# Patient Record
Sex: Female | Born: 1940 | Race: White | Hispanic: No | Smoking: Former smoker
Health system: Southern US, Community
[De-identification: ages and names within clinical notes are randomized; demographics above are authoritative.]

## PROBLEM LIST (undated history)

## (undated) DIAGNOSIS — I493 Ventricular premature depolarization: Secondary | ICD-10-CM

## (undated) DIAGNOSIS — K219 Gastro-esophageal reflux disease without esophagitis: Secondary | ICD-10-CM

## (undated) DIAGNOSIS — Z79899 Other long term (current) drug therapy: Secondary | ICD-10-CM

## (undated) DIAGNOSIS — Z7901 Long term (current) use of anticoagulants: Secondary | ICD-10-CM

## (undated) DIAGNOSIS — E039 Hypothyroidism, unspecified: Secondary | ICD-10-CM

## (undated) DIAGNOSIS — D2121 Benign neoplasm of connective and other soft tissue of right lower limb, including hip: Secondary | ICD-10-CM

## (undated) DIAGNOSIS — I4891 Unspecified atrial fibrillation: Secondary | ICD-10-CM

## (undated) DIAGNOSIS — N189 Chronic kidney disease, unspecified: Secondary | ICD-10-CM

## (undated) DIAGNOSIS — M47812 Spondylosis without myelopathy or radiculopathy, cervical region: Secondary | ICD-10-CM

## (undated) DIAGNOSIS — R011 Cardiac murmur, unspecified: Secondary | ICD-10-CM

## (undated) DIAGNOSIS — F419 Anxiety disorder, unspecified: Secondary | ICD-10-CM

## (undated) DIAGNOSIS — D472 Monoclonal gammopathy: Secondary | ICD-10-CM

## (undated) DIAGNOSIS — E042 Nontoxic multinodular goiter: Secondary | ICD-10-CM

## (undated) DIAGNOSIS — J349 Unspecified disorder of nose and nasal sinuses: Secondary | ICD-10-CM

## (undated) DIAGNOSIS — K529 Noninfective gastroenteritis and colitis, unspecified: Secondary | ICD-10-CM

## (undated) DIAGNOSIS — I471 Supraventricular tachycardia, unspecified: Secondary | ICD-10-CM

## (undated) DIAGNOSIS — J45909 Unspecified asthma, uncomplicated: Secondary | ICD-10-CM

## (undated) DIAGNOSIS — D6851 Activated protein C resistance: Secondary | ICD-10-CM

## (undated) DIAGNOSIS — I4719 Other supraventricular tachycardia: Secondary | ICD-10-CM

## (undated) DIAGNOSIS — J069 Acute upper respiratory infection, unspecified: Secondary | ICD-10-CM

## (undated) DIAGNOSIS — I890 Lymphedema, not elsewhere classified: Secondary | ICD-10-CM

## (undated) DIAGNOSIS — I10 Essential (primary) hypertension: Secondary | ICD-10-CM

## (undated) DIAGNOSIS — N183 Chronic kidney disease, stage 3 unspecified: Secondary | ICD-10-CM

## (undated) DIAGNOSIS — R001 Bradycardia, unspecified: Secondary | ICD-10-CM

## (undated) DIAGNOSIS — Z87442 Personal history of urinary calculi: Secondary | ICD-10-CM

## (undated) DIAGNOSIS — M1990 Unspecified osteoarthritis, unspecified site: Secondary | ICD-10-CM

## (undated) DIAGNOSIS — B029 Zoster without complications: Secondary | ICD-10-CM

## (undated) DIAGNOSIS — I700 Atherosclerosis of aorta: Secondary | ICD-10-CM

## (undated) DIAGNOSIS — K649 Unspecified hemorrhoids: Secondary | ICD-10-CM

## (undated) DIAGNOSIS — I2510 Atherosclerotic heart disease of native coronary artery without angina pectoris: Secondary | ICD-10-CM

## (undated) DIAGNOSIS — K5790 Diverticulosis of intestine, part unspecified, without perforation or abscess without bleeding: Secondary | ICD-10-CM

## (undated) DIAGNOSIS — K635 Polyp of colon: Secondary | ICD-10-CM

## (undated) DIAGNOSIS — M810 Age-related osteoporosis without current pathological fracture: Secondary | ICD-10-CM

## (undated) DIAGNOSIS — R7303 Prediabetes: Secondary | ICD-10-CM

## (undated) DIAGNOSIS — E785 Hyperlipidemia, unspecified: Secondary | ICD-10-CM

## (undated) HISTORY — PX: BREAST CYST ASPIRATION: SHX578

## (undated) HISTORY — PX: BLEPHAROPLASTY: SUR158

## (undated) HISTORY — PX: APPENDECTOMY: SHX54

## (undated) HISTORY — PX: CARDIAC CATHETERIZATION: SHX172

## (undated) HISTORY — DX: Age-related osteoporosis without current pathological fracture: M81.0

## (undated) HISTORY — DX: Hyperlipidemia, unspecified: E78.5

## (undated) HISTORY — DX: Anxiety disorder, unspecified: F41.9

## (undated) HISTORY — DX: Gastro-esophageal reflux disease without esophagitis: K21.9

## (undated) HISTORY — DX: Polyp of colon: K63.5

## (undated) HISTORY — DX: Noninfective gastroenteritis and colitis, unspecified: K52.9

## (undated) HISTORY — PX: NECK SURGERY: SHX720

## (undated) HISTORY — DX: Monoclonal gammopathy: D47.2

## (undated) HISTORY — DX: Essential (primary) hypertension: I10

## (undated) HISTORY — DX: Unspecified osteoarthritis, unspecified site: M19.90

## (undated) HISTORY — PX: ABDOMINAL HYSTERECTOMY: SHX81

## (undated) HISTORY — DX: Zoster without complications: B02.9

## (undated) HISTORY — PX: KNEE SURGERY: SHX244

## (undated) HISTORY — PX: COLONOSCOPY: SHX174

---

## 2008-02-12 DIAGNOSIS — K5730 Diverticulosis of large intestine without perforation or abscess without bleeding: Secondary | ICD-10-CM | POA: Insufficient documentation

## 2010-07-15 DIAGNOSIS — Z9889 Other specified postprocedural states: Secondary | ICD-10-CM

## 2010-07-15 HISTORY — PX: LEFT HEART CATH AND CORONARY ANGIOGRAPHY: CATH118249

## 2010-07-15 HISTORY — DX: Other specified postprocedural states: Z98.890

## 2010-07-30 DIAGNOSIS — I2510 Atherosclerotic heart disease of native coronary artery without angina pectoris: Secondary | ICD-10-CM | POA: Insufficient documentation

## 2013-04-26 DIAGNOSIS — E042 Nontoxic multinodular goiter: Secondary | ICD-10-CM | POA: Insufficient documentation

## 2014-11-28 DIAGNOSIS — D6851 Activated protein C resistance: Secondary | ICD-10-CM | POA: Insufficient documentation

## 2015-09-17 DIAGNOSIS — D179 Benign lipomatous neoplasm, unspecified: Secondary | ICD-10-CM

## 2015-09-17 HISTORY — DX: Benign lipomatous neoplasm, unspecified: D17.9

## 2015-10-31 DIAGNOSIS — N952 Postmenopausal atrophic vaginitis: Secondary | ICD-10-CM | POA: Insufficient documentation

## 2017-02-10 DIAGNOSIS — R600 Localized edema: Secondary | ICD-10-CM | POA: Insufficient documentation

## 2017-09-21 DIAGNOSIS — R1310 Dysphagia, unspecified: Secondary | ICD-10-CM | POA: Insufficient documentation

## 2017-09-21 DIAGNOSIS — M19011 Primary osteoarthritis, right shoulder: Secondary | ICD-10-CM | POA: Insufficient documentation

## 2017-11-03 DIAGNOSIS — Z8711 Personal history of peptic ulcer disease: Secondary | ICD-10-CM | POA: Insufficient documentation

## 2017-11-14 DIAGNOSIS — M8580 Other specified disorders of bone density and structure, unspecified site: Secondary | ICD-10-CM | POA: Insufficient documentation

## 2018-03-23 DIAGNOSIS — I471 Supraventricular tachycardia: Secondary | ICD-10-CM | POA: Insufficient documentation

## 2018-04-05 HISTORY — PX: LEFT HEART CATH AND CORONARY ANGIOGRAPHY: CATH118249

## 2018-11-06 DIAGNOSIS — M810 Age-related osteoporosis without current pathological fracture: Secondary | ICD-10-CM | POA: Insufficient documentation

## 2019-01-21 ENCOUNTER — Emergency Department: Payer: Medicare Other

## 2019-01-21 ENCOUNTER — Emergency Department
Admission: EM | Admit: 2019-01-21 | Discharge: 2019-01-21 | Disposition: A | Discharge status: Home / Self Care | Payer: Medicare Other | Attending: Emergency Medicine | Admitting: Emergency Medicine

## 2019-01-21 ENCOUNTER — Other Ambulatory Visit: Payer: Self-pay

## 2019-01-21 ENCOUNTER — Encounter: Payer: Self-pay | Admitting: Emergency Medicine

## 2019-01-21 DIAGNOSIS — M2291 Unspecified disorder of patella, right knee: Secondary | ICD-10-CM | POA: Diagnosis not present

## 2019-01-21 DIAGNOSIS — Z87891 Personal history of nicotine dependence: Secondary | ICD-10-CM | POA: Diagnosis not present

## 2019-01-21 DIAGNOSIS — Z79899 Other long term (current) drug therapy: Secondary | ICD-10-CM | POA: Diagnosis not present

## 2019-01-21 DIAGNOSIS — M79604 Pain in right leg: Secondary | ICD-10-CM | POA: Diagnosis present

## 2019-01-21 DIAGNOSIS — R2241 Localized swelling, mass and lump, right lower limb: Secondary | ICD-10-CM | POA: Diagnosis not present

## 2019-01-21 DIAGNOSIS — M5431 Sciatica, right side: Secondary | ICD-10-CM

## 2019-01-21 HISTORY — DX: Supraventricular tachycardia: I47.1

## 2019-01-21 HISTORY — DX: Activated protein C resistance: D68.51

## 2019-01-21 HISTORY — DX: Other supraventricular tachycardia: I47.19

## 2019-01-21 IMAGING — DX DG KNEE COMPLETE 4+V*R*
4 series · 4 of 4 positions shown · non-contrast
Comparison: None.

CLINICAL DATA: Right knee pain for approximately 1.5 weeks. No
known injury.

EXAM:
RIGHT KNEE - COMPLETE 4+ VIEW

[knee ap]
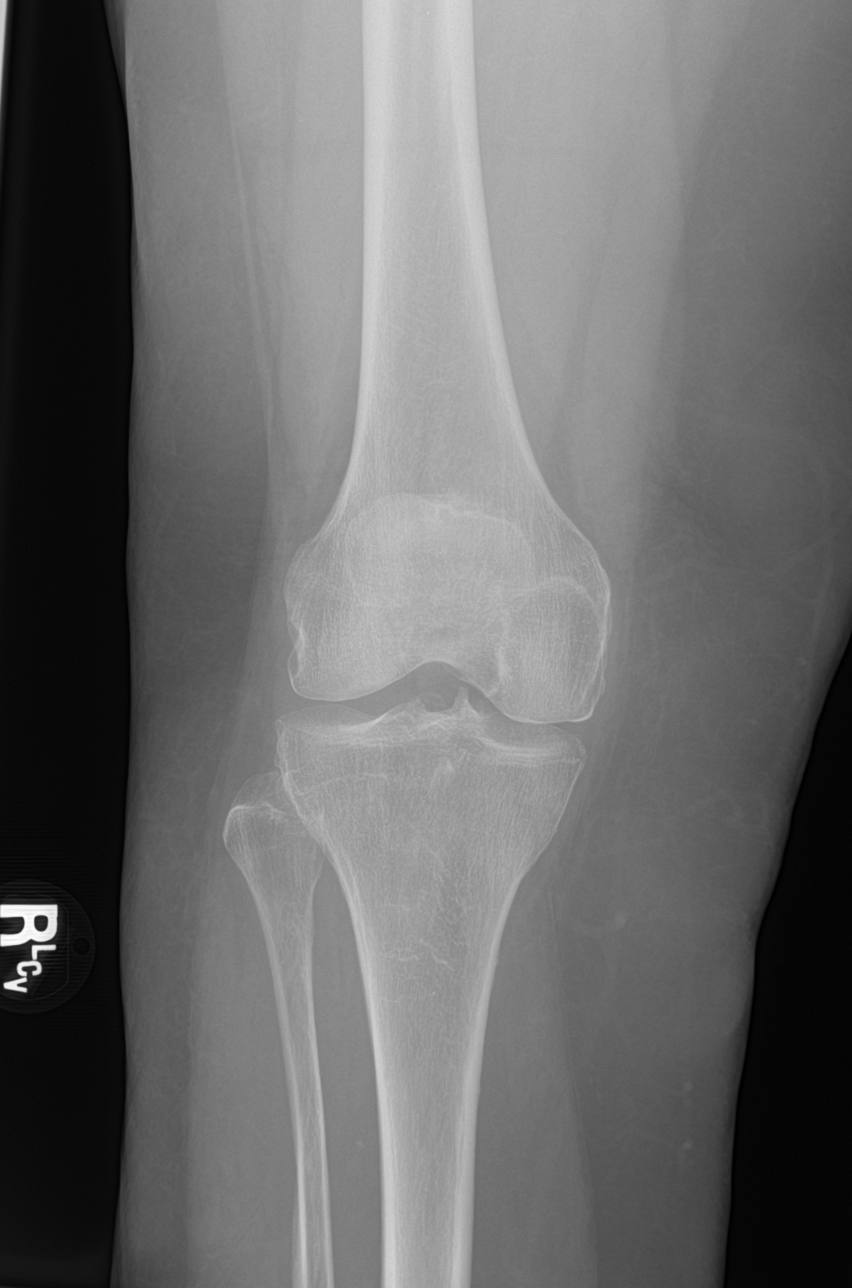

[knee lat]
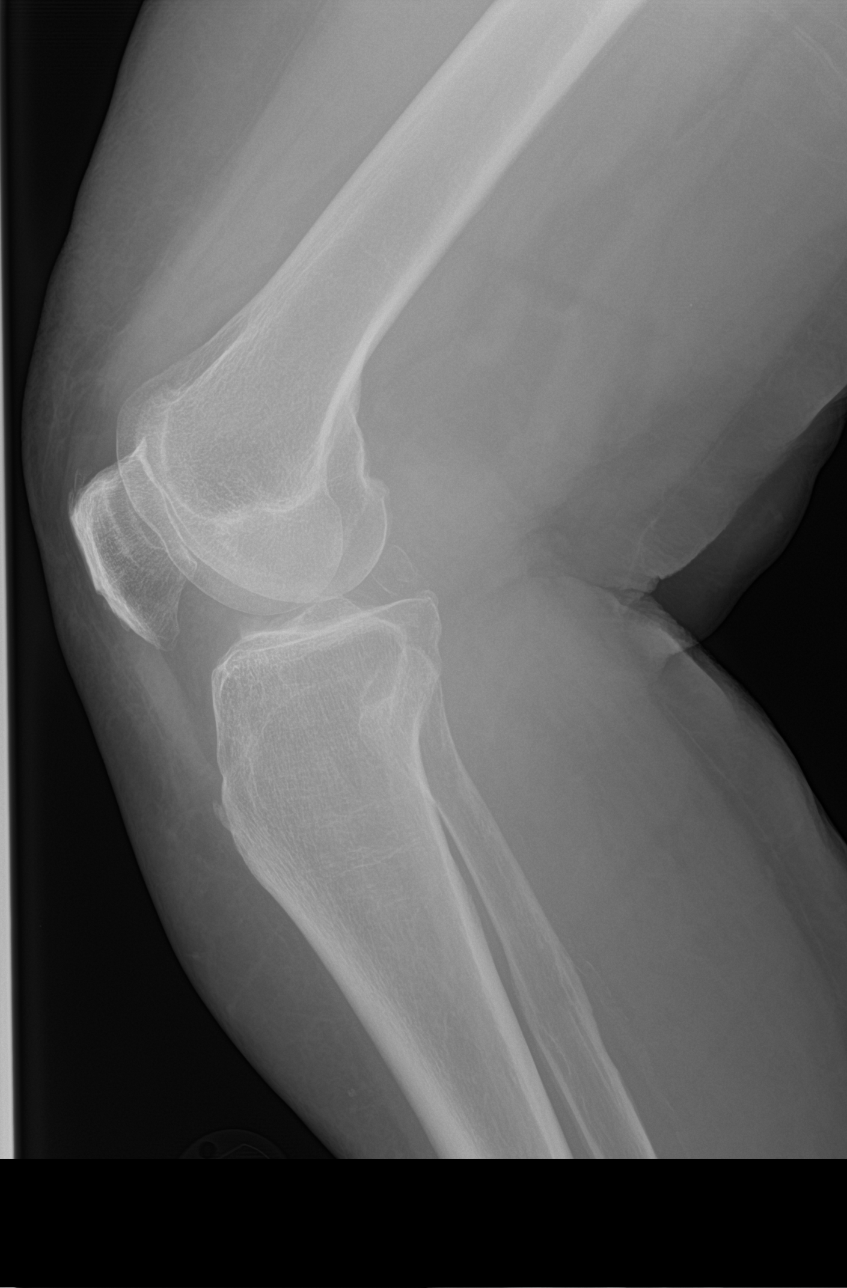

[knee obl (1 of 2)]
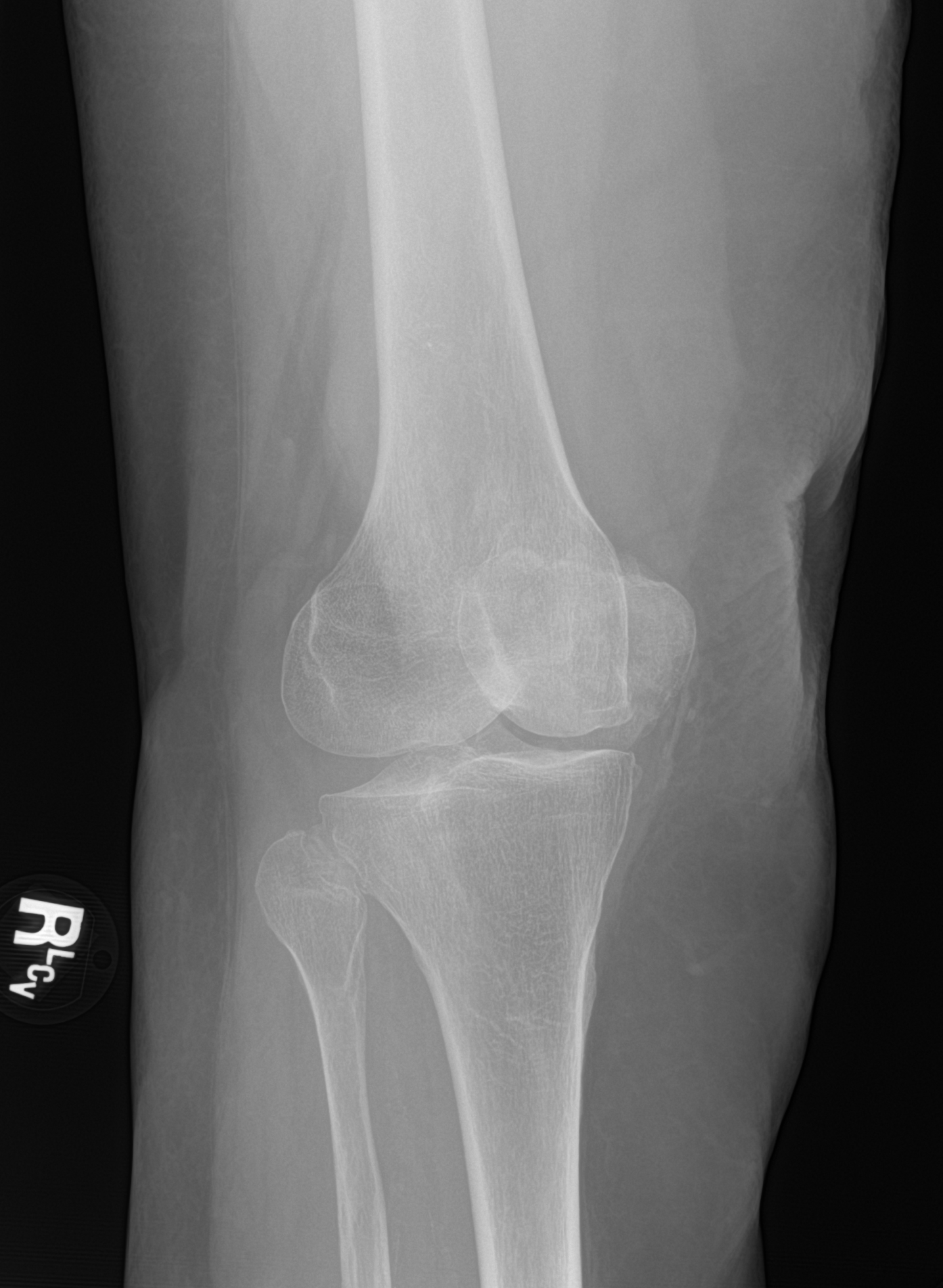

[knee obl (2 of 2)]
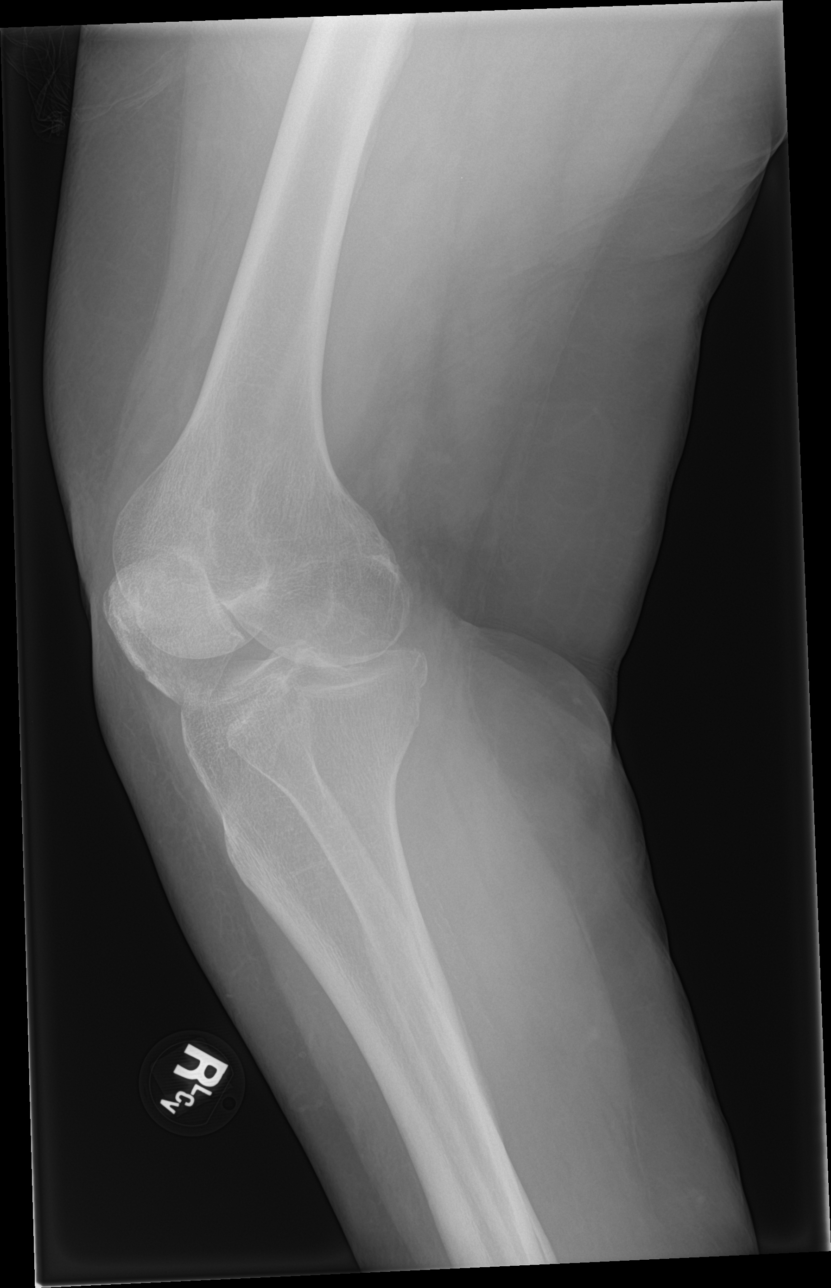

[4 of 4 positions shown; findings below may reference images not displayed]

FINDINGS: There is no acute bony or joint abnormality. Small osteophyte off
the superior pole of the patella is noted. Patella baja is also
seen. Small joint effusion.
IMPRESSION: No acute bony abnormality.

Small joint effusion.

Mild patellofemoral osteoarthritis and patella baja.

## 2019-01-21 IMAGING — US US EXTREM LOW VENOUS*R*
1 series · 13 of 24 positions shown · non-contrast
Comparison: None.

CLINICAL DATA: Right lower extremity pain and edema for the past
week. Evaluate for DVT.



[Series 1: us extrem low venous*right* · 13 of 34 slices shown]
[im 1/34]
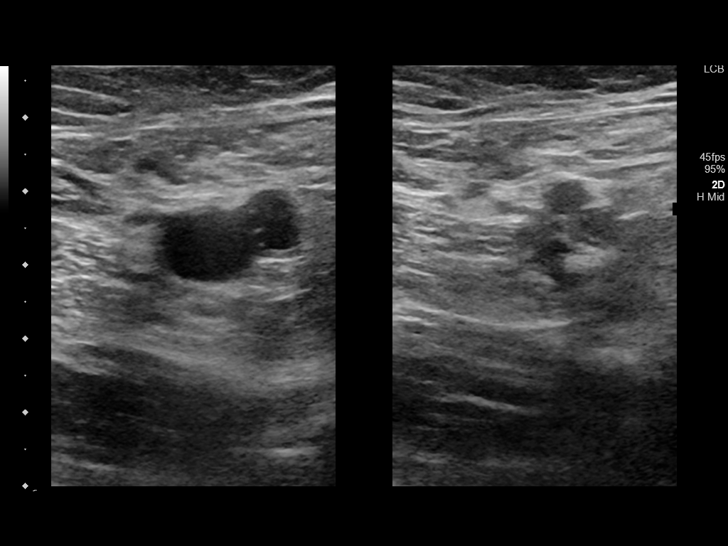
[im 3/34]
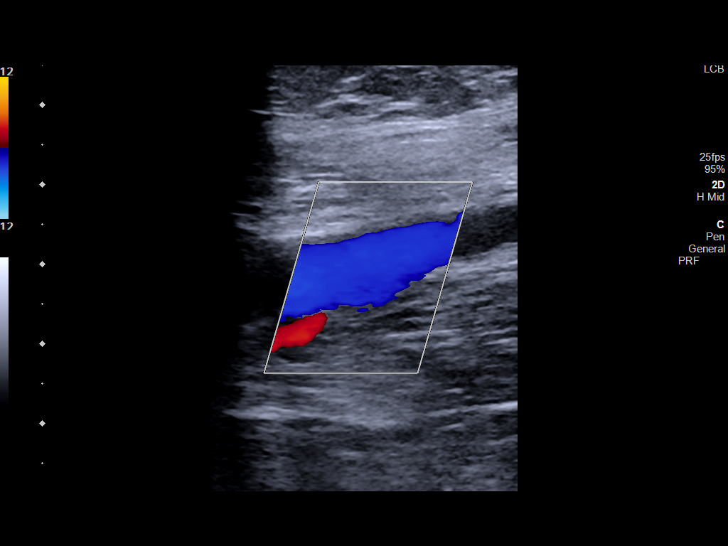
[im 6/34]
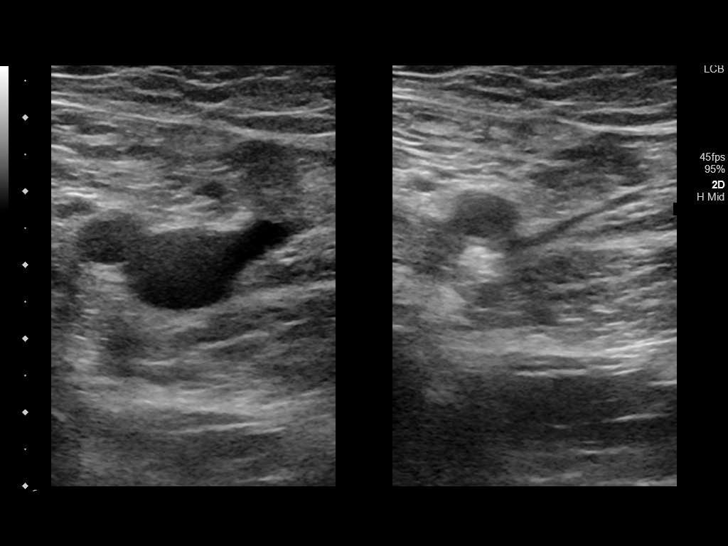
[im 9/34]
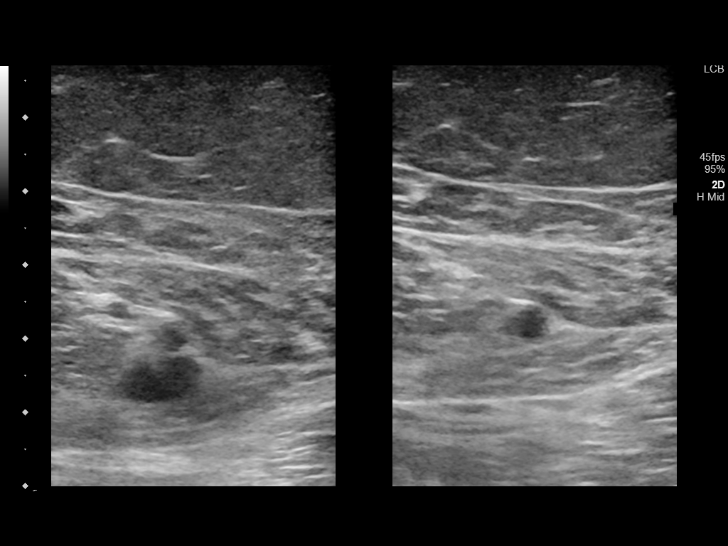
[im 12/34]
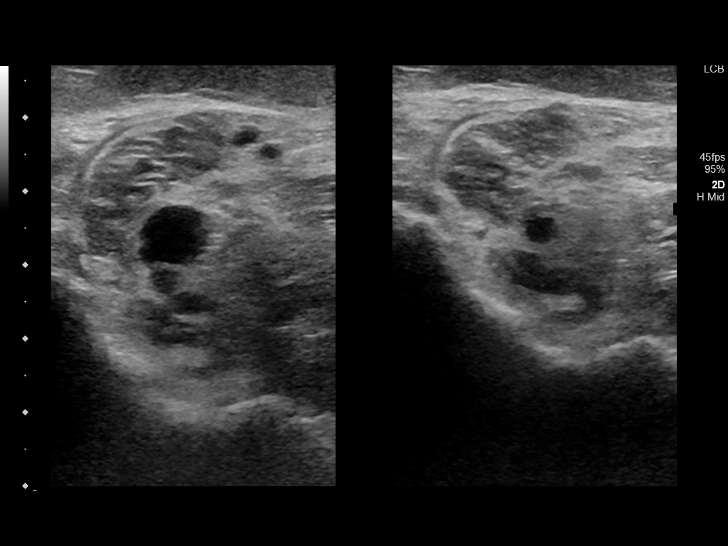
[im 15/34]
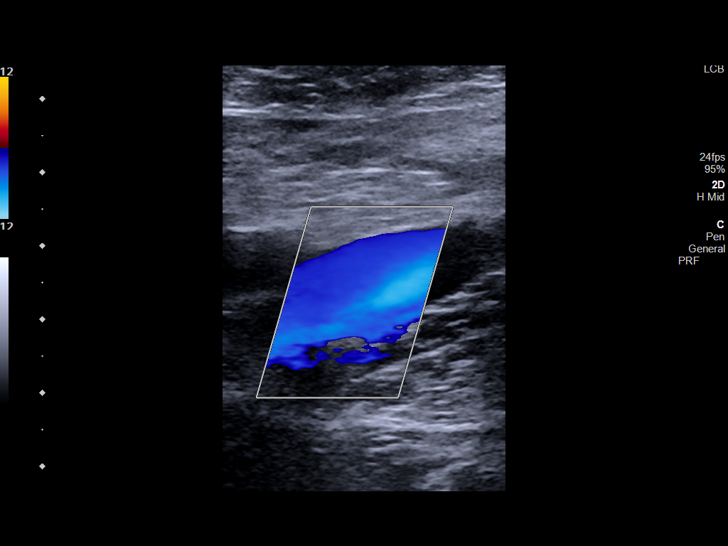
[im 18/34]
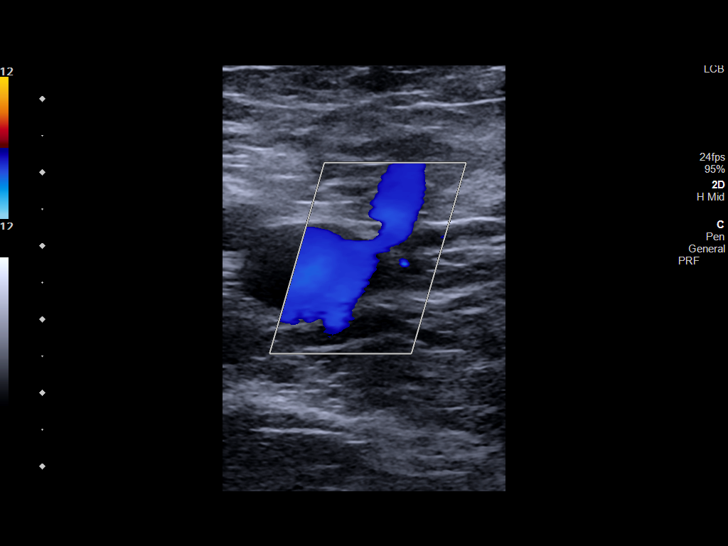
[im 19/34]
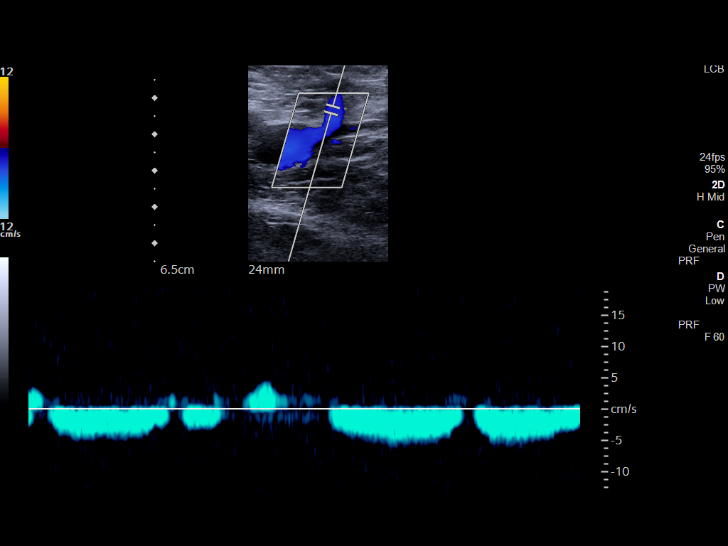
[im 22/34]
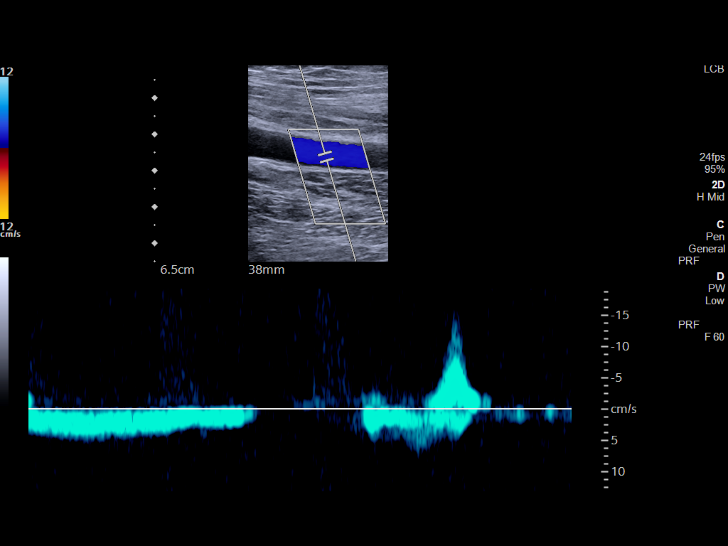
[im 25/34]
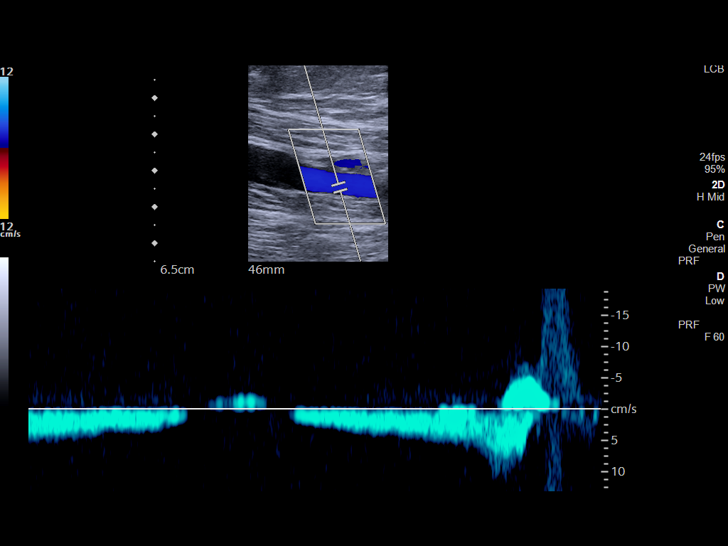
[im 28/34]
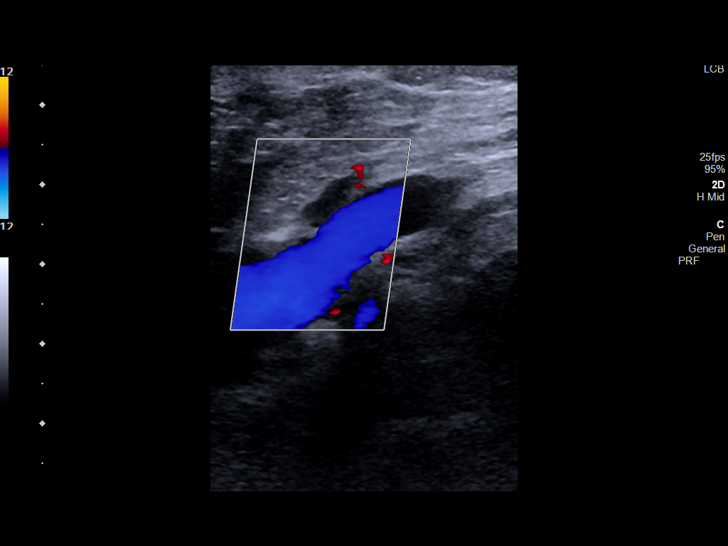
[im 31/34]
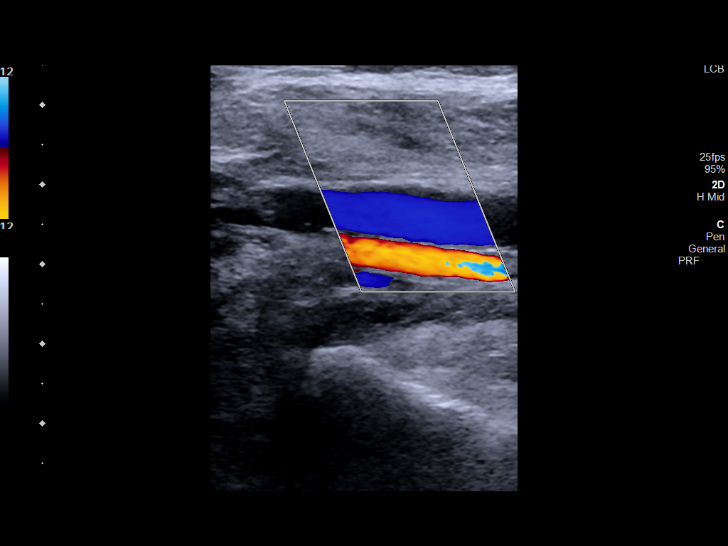
[im 34/34]
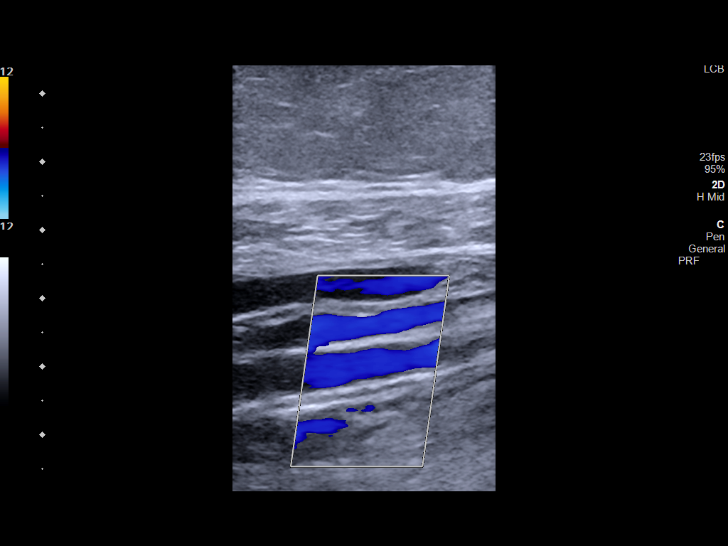

[13 of 24 positions shown; findings below may reference images not displayed]

FINDINGS: Contralateral Common Femoral Vein: Respiratory phasicity is normal
and symmetric with the symptomatic side. No evidence of thrombus.
Normal compressibility.

Common Femoral Vein: No evidence of thrombus. Normal
compressibility, respiratory phasicity and response to augmentation.

Saphenofemoral Junction: No evidence of thrombus. Normal
compressibility and flow on color Doppler imaging.

Profunda Femoral Vein: No evidence of thrombus. Normal
compressibility and flow on color Doppler imaging.

Femoral Vein: No evidence of thrombus. Normal compressibility,
respiratory phasicity and response to augmentation.

Popliteal Vein: No evidence of thrombus. Normal compressibility,
respiratory phasicity and response to augmentation.

Calf Veins: No evidence of thrombus. Normal compressibility and flow
on color Doppler imaging.

Superficial Great Saphenous Vein: No evidence of thrombus. Normal
compressibility.

Venous Reflux:  None.

Other Findings:  None.
IMPRESSION: No evidence DVT within the right lower extremity.

## 2019-01-21 MED ORDER — PREDNISONE 10 MG (21) PO TBPK: ORAL_TABLET | ORAL | 0 refills | Status: DC

## 2019-01-21 MED ORDER — TRAMADOL HCL 50 MG PO TABS: 50.0000 mg | ORAL_TABLET | Freq: Four times a day (QID) | ORAL | 0 refills | Status: DC | PRN

## 2019-01-21 NOTE — ED Provider Notes (Signed)
[facility] Emergency Department Provider Note ____________________________________________  Time seen: Approximately 10:52 AM  I have reviewed the triage vital signs and the nursing notes.   HISTORY  Chief Complaint Hip Pain and Leg Pain    HPI Magali Amelio is a 78 y.o. female who presents to the emergency department for evaluation and treatment of right leg pain and swelling. She is concerned that she may have a blood clot. She denies specific injury, but states that she has started a new workout and has mowed the grass. No relief with ibuprofen.   Past Medical History:  Diagnosis Date  . Factor V Leiden ([place])   . PAT (paroxysmal atrial tachycardia) ([facility])     There are no active problems to display for this patient.   History reviewed. No pertinent surgical history.  Prior to Admission medications   Medication Sig Start Date End Date Taking? Authorizing Provider  atenolol (TENORMIN) 25 MG tablet Take by mouth daily.   Yes [provider]  atorvastatin (LIPITOR) 20 MG tablet Take 20 mg by mouth daily.   Yes [provider]  predniSONE (STERAPRED UNI-PAK 21 TAB) 10 MG (21) TBPK tablet Take 6 tablets on the first day and decrease by 1 tablet each day until finished. 01/21/19   Ival Pacer B, FNP  traMADol (ULTRAM) 50 MG tablet Take 1 tablet (50 mg total) by mouth every 6 (six) hours as needed. 01/21/19   Jamea Robicheaux, Johnette Abraham B, FNP    Allergies Metoprolol succinate  [metoprolol tartrate] and Other  No family history on file.  Social History Social History   Tobacco Use  . Smoking status: Former Research scientist (life sciences)  . Smokeless tobacco: Never Used  Substance Use Topics  . Alcohol use: Not on file  . Drug use: Not on file    Review of Systems Constitutional: Negative for fever. Cardiovascular: Negative for chest pain. Respiratory: Negative for shortness of breath. Musculoskeletal: Positive for right leg pain Skin: Negative for open  wounds.  Neurological: Negative for decrease in sensation  ____________________________________________   PHYSICAL EXAM:  VITAL SIGNS: ED Triage Vitals [01/21/19 1041]  Enc Vitals Group     BP (!) 170/92     Pulse Rate (!) 57     Resp 18     Temp 98.3 F (36.8 C)     Temp Source Oral     SpO2 94 %     Weight 160 lb (72.6 kg)     Height 5\' 2"  (1.575 m)     Head Circumference      Peak Flow      Pain Score 8     Pain Loc      Pain Edu?      Excl. in [place]?     Constitutional: Alert and oriented. Well appearing and in no acute distress. Eyes: Conjunctivae are clear without discharge or drainage Head: Atraumatic Neck: Supple Respiratory: No cough. Respirations are even and unlabored. Musculoskeletal: FROM of the right leg, knee, foot, and ankle. Tenderness behind right knee. No obvious swelling or joint effusion. Neurologic: Motor and sensory function is intact.  Skin: No wounds or lesions over right leg.  Psychiatric: Affect and behavior are appropriate.  ____________________________________________   LABS (all labs ordered are listed, but only abnormal results are displayed)  Labs Reviewed - No data to display ____________________________________________  RADIOLOGY  Ultrasound of the right lower extremity is negative for DVT.  X-ray image of the right knee shows a low-lying patella and osteophyte/arthritis. ____________________________________________  PROCEDURES  Procedures  ____________________________________________   INITIAL IMPRESSION / ASSESSMENT AND PLAN / ED COURSE  Mariahlynn Cubbage is a 78 y.o. who presents to the emergency department for treatment and evaluation of right lower extremity pain.  She will be treated with prednisone and tramadol and is to follow-up with orthopedics.  Medications - No data to display  Pertinent labs & imaging results that were available during my care of the patient were reviewed by me and considered in my medical  decision making (see chart for details).  _________________________________________   FINAL CLINICAL IMPRESSION(S) / ED DIAGNOSES  Final diagnoses:  Patellar disorder, right  Sciatica of right side    ED Discharge Orders         Ordered    predniSONE (STERAPRED UNI-PAK 21 TAB) 10 MG (21) TBPK tablet     01/21/19 1308    traMADol (ULTRAM) 50 MG tablet  Every 6 hours PRN     01/21/19 1308           If controlled substance prescribed during this visit, 12 month history viewed on the [place] prior to issuing an initial prescription for Schedule II or III opiod.   Victorino Dike, FNP 01/21/19 1348    Delman Kitten, MD 01/21/19 1528

## 2019-01-21 NOTE — ED Notes (Signed)
See triage note  Presents wit right leg pain  States pain started at hip and moved down her leg  States she noticed some swelling to leg also   States she has been using IBU 600 mg with some relief ambulates with sl limp d/t pain

## 2019-01-21 NOTE — Discharge Instructions (Signed)
The x-ray shows that your kneecap sits lower than expected. This is likely the reason for your pain.  Do not take the ibuprofen while you are taking the prednisone. The prednisone should help with the pain that is in your buttock and leg.  Schedule an appointment with orthopedics.

## 2019-01-21 NOTE — ED Triage Notes (Signed)
Pt c/o right hip and leg pain. Pt states she has had the pain x 1.5 weeks. Pt states she started exercising recently as well as mowing x 1.5 hours.  Pt reports the pain is a throbbing pain. Pt reports feeling a pulling sensation in her knee.

## 2019-01-23 DIAGNOSIS — D472 Monoclonal gammopathy: Secondary | ICD-10-CM | POA: Insufficient documentation

## 2019-03-28 ENCOUNTER — Ambulatory Visit (INDEPENDENT_AMBULATORY_CARE_PROVIDER_SITE_OTHER): Payer: Medicare Other | Admitting: Urology

## 2019-03-28 ENCOUNTER — Other Ambulatory Visit: Payer: Self-pay

## 2019-03-28 ENCOUNTER — Encounter: Payer: Self-pay | Admitting: Urology

## 2019-03-28 VITALS — BP 158/77 | HR 54 | Ht 62.0 in | Wt 165.0 lb

## 2019-03-28 DIAGNOSIS — N3941 Urge incontinence: Secondary | ICD-10-CM

## 2019-03-28 DIAGNOSIS — R3121 Asymptomatic microscopic hematuria: Secondary | ICD-10-CM | POA: Diagnosis not present

## 2019-03-28 MED ORDER — MIRABEGRON ER 50 MG PO TB24: 50.0000 mg | ORAL_TABLET | Freq: Every day | ORAL | 11 refills | Status: DC

## 2019-03-28 NOTE — Progress Notes (Signed)
03/28/19 3:29 PM   Karie Soda [birth year] [id number]  Referring provider: [place] [address] [place],  [place] [address]  CC: Urgency and urge incontinence  HPI: I saw Ms. Mieles in urology clinic in consultation for urgency and urge incontinence.  She is a very healthy 78 year old female who reports over a year of bothersome urgency and urge incontinence.  She reports when she gets up and starts to go to the bathroom she will leak before she makes it in time.  She denies any stress incontinence.  She has not had trouble with recurrent UTIs.  She denies any dysuria, gross hematuria, or flank pain.  She tried oxybutynin from her PCP which improved her urgency and incontinence, however she had bothersome dry mouth and GI upset with this medication and self stopped it.  She drinks 2 cups of coffee in the morning and then water the rest of the day.  She has had a hysterectomy previously.  She has a 10-pack-year smoking history, and quit 30 years ago.  Urinalysis today shows 0-5 WBCs, 3-10 RBCs, no bacteria, nitrite negative.  PMH: Past Medical History:  Diagnosis Date  . Anxiety   . Arthritis   . Colon polyp   . Factor V Leiden ([place])   . GERD (gastroesophageal reflux disease)   . Herpes zoster   . Hyperlipidemia   . Hypertension   . Inflammatory bowel disease   . Osteoporosis   . PAT (paroxysmal atrial tachycardia) ([place])     Surgical History: Past Surgical History:  Procedure Laterality Date  . APPENDECTOMY    . COLONOSCOPY      Allergies:  Allergies  Allergen Reactions  . Metoprolol Succinate  [Metoprolol Tartrate] Other (See Comments)    headache  . Other Rash    "Dexatone"-kidney medication  . Dexamethasone Rash    Family History: No family history on file.  Social History:  reports that she has quit smoking. She has never used smokeless tobacco. She reports that she does not use drugs. No history on file for alcohol.  ROS: Please see  flowsheet from today's date for complete review of systems.  Physical Exam: BP (!) 158/77 (BP Location: Left Arm, Patient Position: Sitting, Cuff Size: Normal)   Pulse (!) 54   Ht 5\' 2"  (1.575 m)   Wt 165 lb (74.8 kg)   BMI 30.18 kg/m    Constitutional:  Alert and oriented, No acute distress. Cardiovascular: No clubbing, cyanosis, or edema. Respiratory: Normal respiratory effort, no increased work of breathing. GI: Abdomen is soft, nontender, nondistended, no abdominal masses GU: No CVA tenderness Lymph: No cervical or inguinal lymphadenopathy. Skin: No rashes, bruises or suspicious lesions. Neurologic: Grossly intact, no focal deficits, moving all 4 extremities. Psychiatric: Normal mood and affect.  Laboratory Data: Urinalysis today 0-5 WBCs, 3-10 RBCs, no bacteria, nitrite negative  Assessment & Plan:   In summary, she is a healthy 78 year old female with over a year of urinary urgency and urge incontinence consistent with overactive bladder.  She also has mild asymptomatic microscopic hematuria on urinalysis today.  We discussed that overactive bladder (OAB) is not a disease, but is a symptom complex that is generally not life-threatening.  Symptoms typically include urinary urgency, frequency, and urge incontinence.  There are numerous treatment options, however there are risks and benefits with both medical and surgical management.  First-line treatment is behavioral therapies including bladder training, pelvic floor muscle training, and fluid management.  Second line treatments include oral antimuscarinics(Ditropan  er, Trospium) and beta-3 agonist (Mybetriq). There is typically a period of medication trial (4-8 weeks) to find the optimal therapy and dosing. If symptoms are bothersome despite the above management, third line options include intra-detrusor botox, peripheral tibial nerve stimulation (PTNS), and interstim (SNS). These are more invasive treatments with higher side effect  profile, but may improve quality of life for patients with severe OAB symptoms.   -Regarding her low-grade microscopic hematuria, we discussed the new AUA guidelines regarding the risk stratification for evaluation.  She would like to defer cystoscopy and CT at this time, but she is amenable to a renal ultrasound.  I think this very reasonable.  We discussed the need for more aggressive evaluation if she developed gross hematuria or increasing severity of her microscopic hematuria -Trial of Myrbetriq for OAB symptoms, she would like to follow-up in 1 year for symptom check, and call in the next few weeks if she does not have an improvement in her symptoms.  A total of 45 minutes were spent face-to-face with the patient, greater than 50% was spent in patient education, counseling, and coordination of care regarding overactive bladder and microscopic hematuria.  Billey Co, [facility] [address], [place], [place] [address] [phone]

## 2019-03-28 NOTE — Patient Instructions (Signed)

## 2019-03-29 LAB — MICROSCOPIC EXAMINATION: Bacteria, UA: NONE SEEN

## 2019-03-29 LAB — URINALYSIS, COMPLETE
Bilirubin, UA: NEGATIVE
Glucose, UA: NEGATIVE
Ketones, UA: NEGATIVE
Leukocytes,UA: NEGATIVE
Nitrite, UA: NEGATIVE
Protein,UA: NEGATIVE
Specific Gravity, UA: 1.025 (ref 1.005–1.030)
Urobilinogen, Ur: 0.2 mg/dL (ref 0.2–1.0)
pH, UA: 7 (ref 5.0–7.5)

## 2019-04-06 ENCOUNTER — Other Ambulatory Visit: Payer: Self-pay

## 2019-04-06 ENCOUNTER — Telehealth: Payer: Self-pay | Admitting: Urology

## 2019-04-06 ENCOUNTER — Ambulatory Visit
Admission: RE | Admit: 2019-04-06 | Discharge: 2019-04-06 | Disposition: A | Discharge status: Home / Self Care | Payer: Medicare Other | Source: Ambulatory Visit | Attending: Urology | Admitting: Urology

## 2019-04-06 DIAGNOSIS — R3121 Asymptomatic microscopic hematuria: Secondary | ICD-10-CM | POA: Insufficient documentation

## 2019-04-06 IMAGING — US US RENAL
1 series · 14 of 25 positions shown · non-contrast
Comparison: None available.

CLINICAL DATA: Initial evaluation for microscopic hematuria.

EXAM:
RENAL / URINARY TRACT ULTRASOUND COMPLETE

[Series 1: us renal · 0.23mm/px · 14 of 40 slices shown]
[im 1/40]
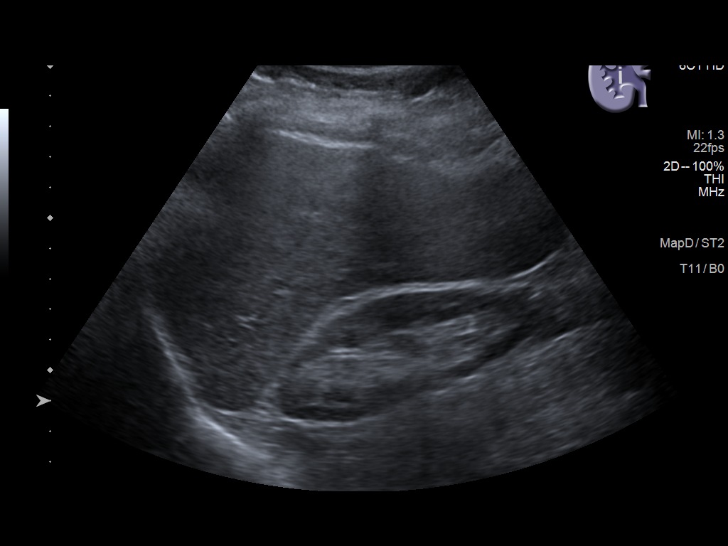
[im 4/40]
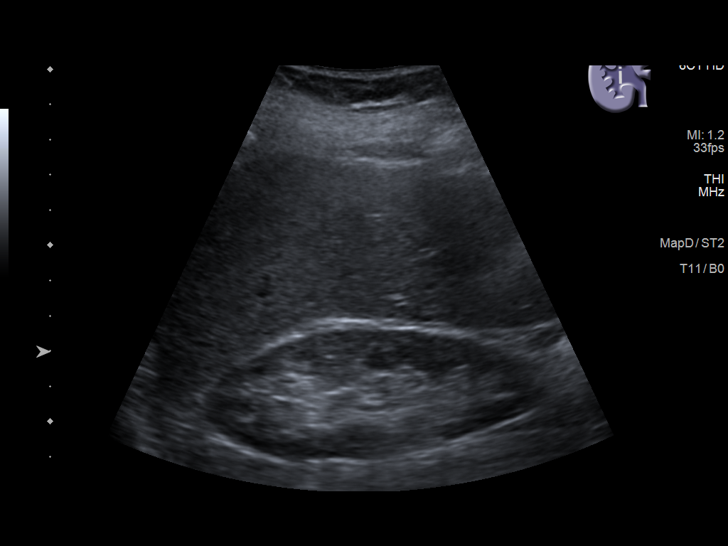
[im 7/40]
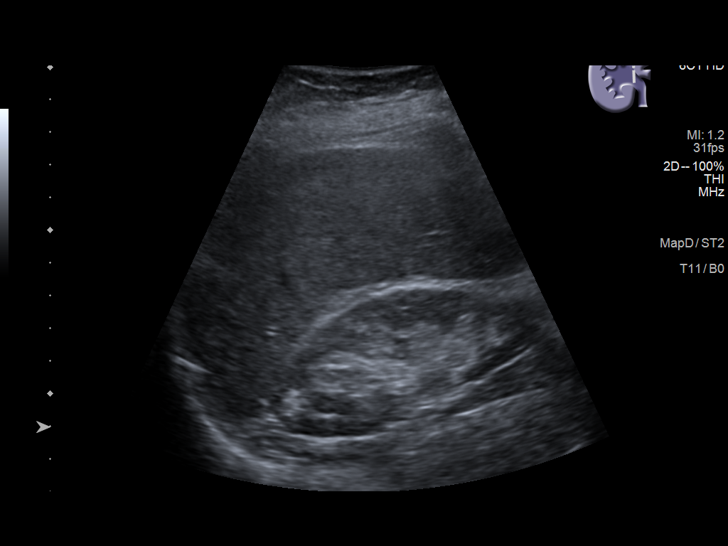
[im 10/40]
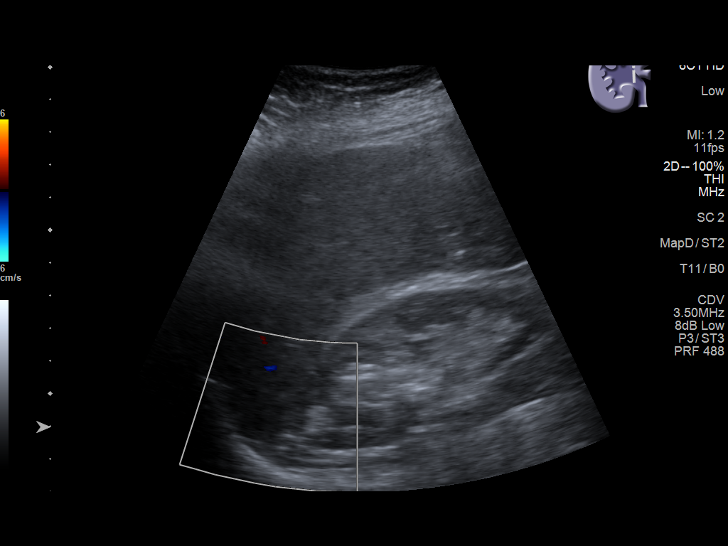
[im 14/40]
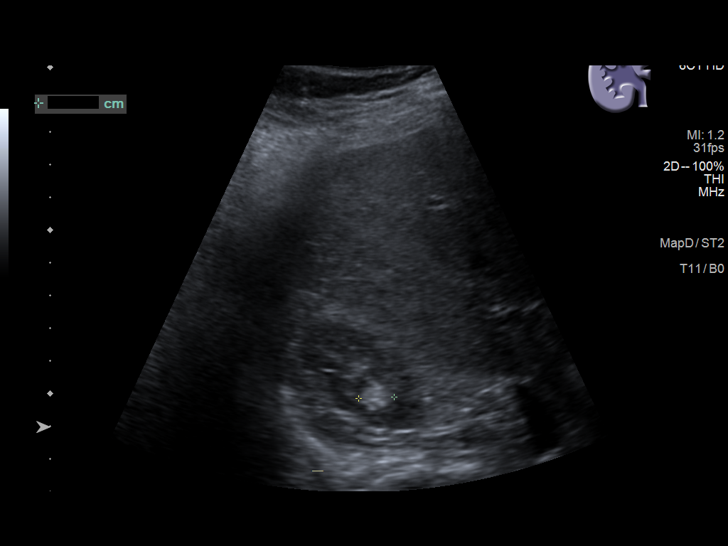
[im 15/40]
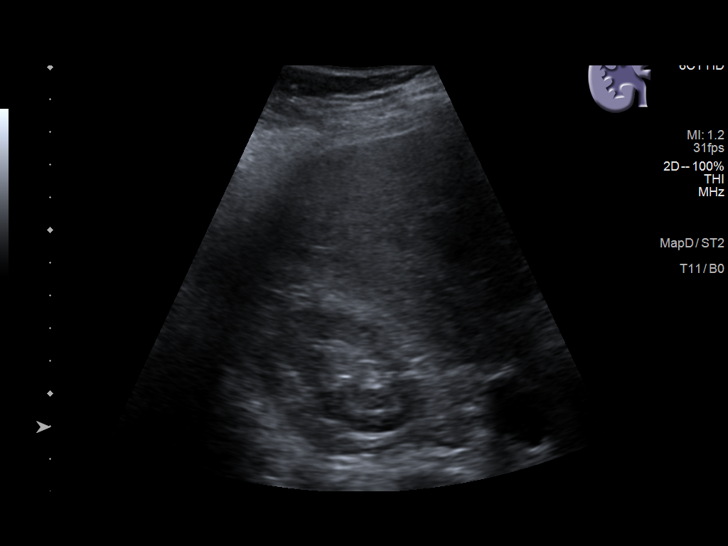
[im 18/40]
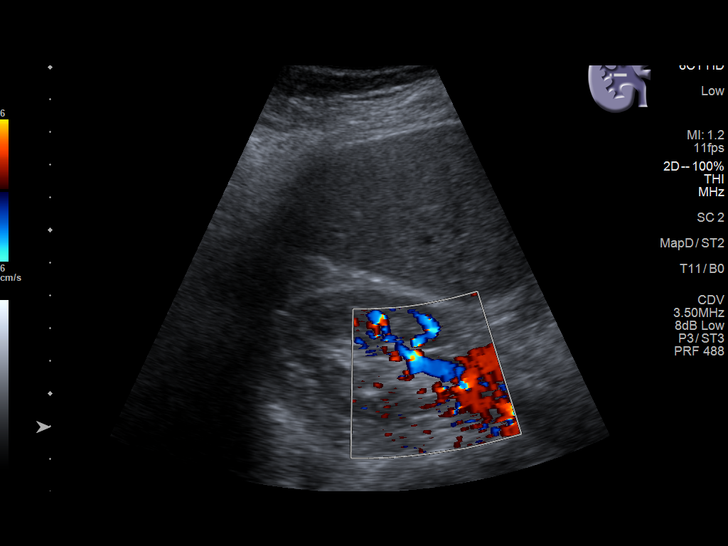
[im 22/40]
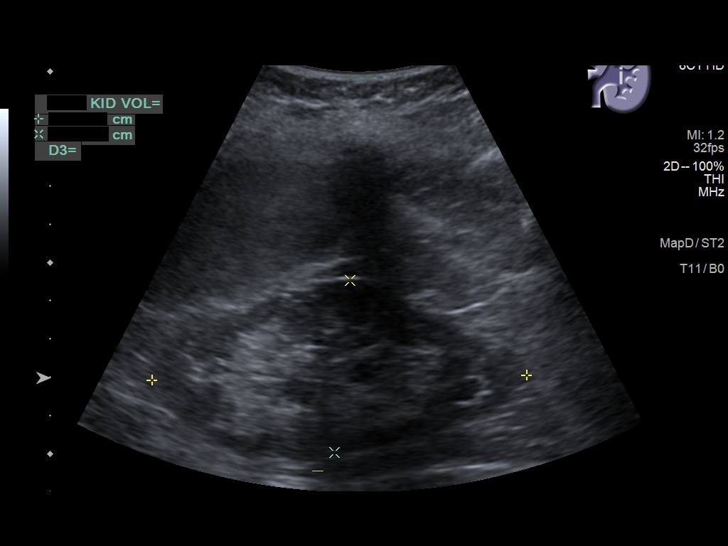
[im 25/40]
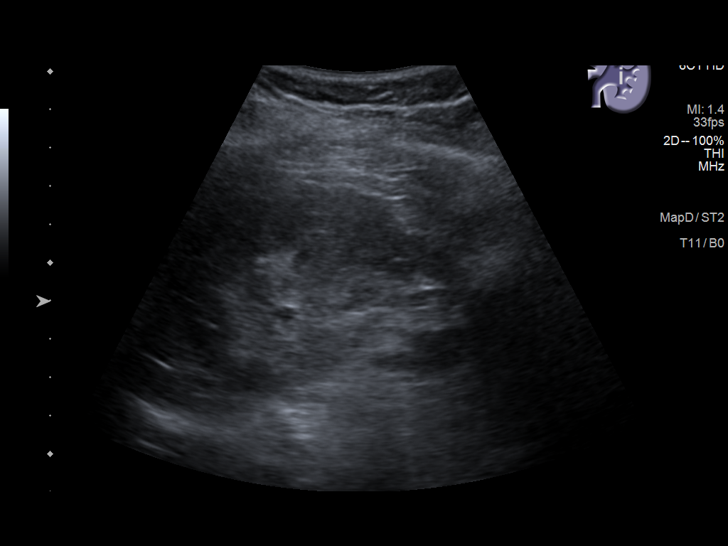
[im 27/40]
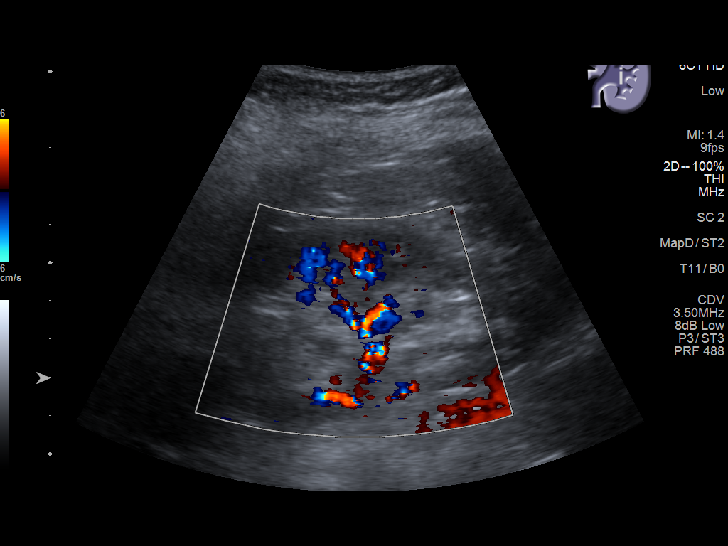
[im 30/40]
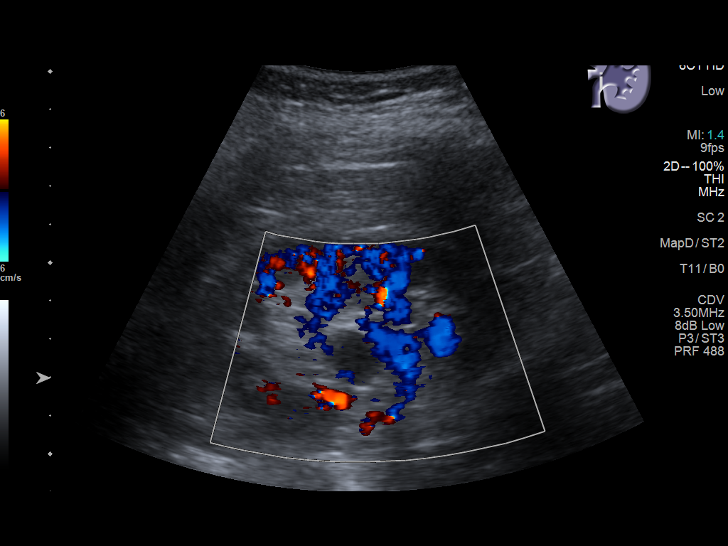
[im 33/40]
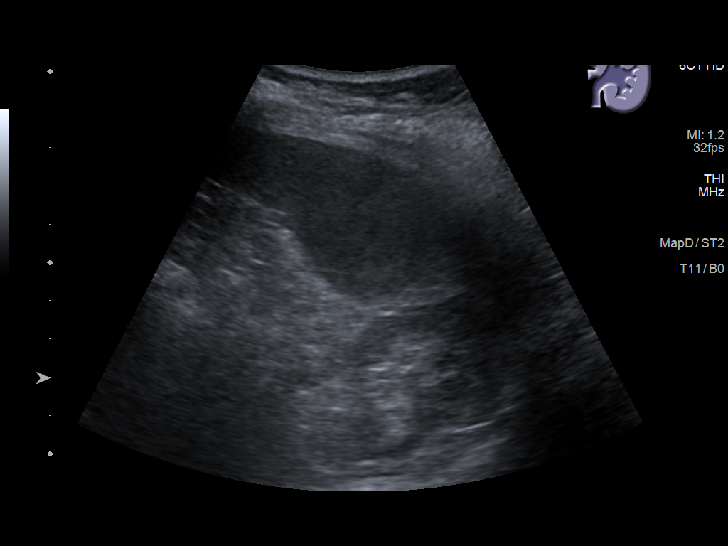
[im 36/40]
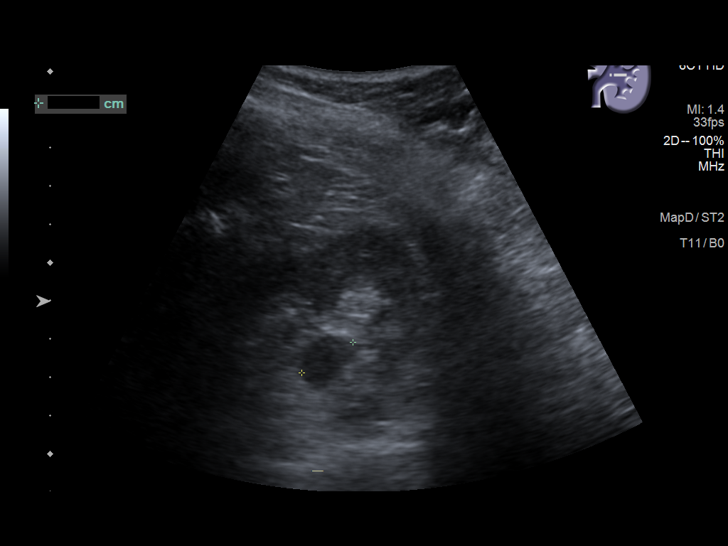
[im 40/40]
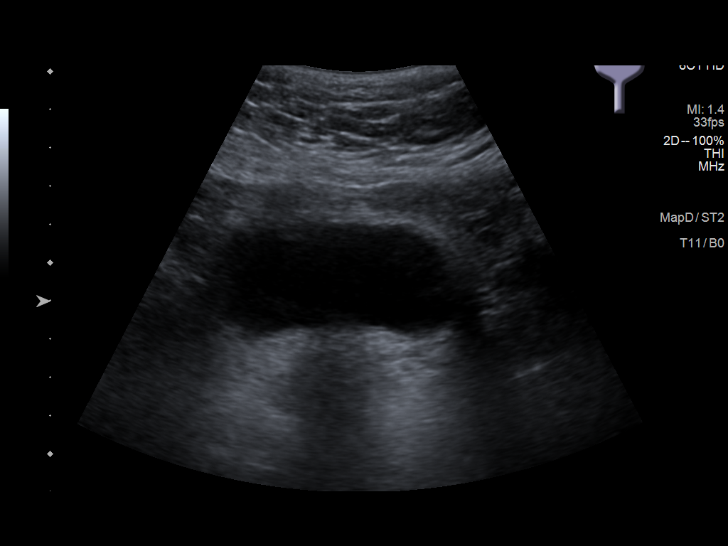

[14 of 25 positions shown; findings below may reference images not displayed]

FINDINGS: Right Kidney:

Renal measurements: 9.8 x 3.7 x 4.7 cm = volume: 88.3 mL. Mild
diffuse cortical thinning. Echogenicity within normal limits. No
nephrolithiasis or hydronephrosis. 1.0 x 0.8 x 1.1 cm echogenic
lesion positioned at the upper pole, most suggestive of a
angiomyolipoma. No other focal renal lesion.

Left Kidney:

Renal measurements: 9.8 x 4.5 x 4.0 cm = volume: 9.6 mL. Mild
diffuse cortical thinning. Echogenicity within normal limits. No
nephrolithiasis or hydronephrosis. 1.6 x 1.5 x 1.6 cm simple
parapelvic cyst noted at the interpolar region. No other focal renal
mass.

Bladder:

Appears normal for degree of bladder distention.

Other:

None.
IMPRESSION: 1. No sonographic evidence for nephrolithiasis or obstructive
uropathy.
2. 1.1 cm echogenic lesion at the upper pole of the right kidney,
most characteristic of a renal AML.
3. 1.6 cm simple parapelvic cyst within the left kidney.
4. Underlying mild diffuse chronic cortical thinning.

## 2019-04-06 MED ORDER — MIRABEGRON ER 50 MG PO TB24: 50.0000 mg | ORAL_TABLET | Freq: Every day | ORAL | 3 refills | Status: DC

## 2019-04-06 NOTE — Telephone Encounter (Signed)
Pt. Would like for Rx of Myrbetriq 50mg  sent to [facility] fax-[phone]. She wants it discontinued for [facility] where it was originally sent.She would also like for Rx to be sent for 90day supply.

## 2019-04-06 NOTE — Telephone Encounter (Signed)
New script faxed to [facility]

## 2019-04-19 ENCOUNTER — Telehealth: Payer: Self-pay

## 2019-04-19 NOTE — Telephone Encounter (Signed)
-----   Message from Billey Co, MD sent at 04/19/2019 10:59 AM EST ----- Renal US did not show any stones or hydronephrosis. A few simple cysts that are not common and not worrisome. Keep one year follow up  Nickolas Madrid, MD 04/19/2019

## 2019-04-19 NOTE — Telephone Encounter (Signed)
mychart notification sent to patient.

## 2019-04-27 ENCOUNTER — Telehealth: Payer: Self-pay | Admitting: Urology

## 2019-04-27 NOTE — Telephone Encounter (Signed)
Patient called and said that she is having good success on the Mybetriq 50mg  however she would like to know if she can take it every other day due to a heart arrhythmia she has.  Please call her and advise   Bonnie Crawford

## 2019-04-27 NOTE — Telephone Encounter (Signed)
Yes, ok to try every other day

## 2019-04-27 NOTE — Telephone Encounter (Signed)
Patient notified

## 2019-07-26 ENCOUNTER — Encounter: Payer: Medicare Other | Admitting: Oncology

## 2019-07-30 ENCOUNTER — Other Ambulatory Visit: Payer: Self-pay

## 2019-07-30 ENCOUNTER — Encounter: Payer: Self-pay | Admitting: Oncology

## 2019-07-30 ENCOUNTER — Inpatient Hospital Stay: Payer: Medicare PPO | Attending: Oncology | Admitting: Oncology

## 2019-07-30 ENCOUNTER — Inpatient Hospital Stay: Payer: Medicare PPO

## 2019-07-30 DIAGNOSIS — K2100 Gastro-esophageal reflux disease with esophagitis, without bleeding: Secondary | ICD-10-CM | POA: Insufficient documentation

## 2019-07-30 DIAGNOSIS — E785 Hyperlipidemia, unspecified: Secondary | ICD-10-CM | POA: Diagnosis not present

## 2019-07-30 DIAGNOSIS — K589 Irritable bowel syndrome without diarrhea: Secondary | ICD-10-CM | POA: Diagnosis not present

## 2019-07-30 DIAGNOSIS — D472 Monoclonal gammopathy: Secondary | ICD-10-CM | POA: Diagnosis not present

## 2019-07-30 DIAGNOSIS — I2510 Atherosclerotic heart disease of native coronary artery without angina pectoris: Secondary | ICD-10-CM | POA: Diagnosis not present

## 2019-07-30 DIAGNOSIS — I10 Essential (primary) hypertension: Secondary | ICD-10-CM | POA: Diagnosis not present

## 2019-07-30 DIAGNOSIS — M1990 Unspecified osteoarthritis, unspecified site: Secondary | ICD-10-CM | POA: Insufficient documentation

## 2019-07-30 DIAGNOSIS — Z87891 Personal history of nicotine dependence: Secondary | ICD-10-CM | POA: Diagnosis not present

## 2019-07-30 DIAGNOSIS — D6851 Activated protein C resistance: Secondary | ICD-10-CM | POA: Insufficient documentation

## 2019-07-30 DIAGNOSIS — Z7982 Long term (current) use of aspirin: Secondary | ICD-10-CM | POA: Insufficient documentation

## 2019-07-30 DIAGNOSIS — Z79899 Other long term (current) drug therapy: Secondary | ICD-10-CM | POA: Diagnosis not present

## 2019-07-30 NOTE — Progress Notes (Signed)
Pt had BM bx 2015 3% plasma cells. 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

## 2019-07-31 LAB — KAPPA/LAMBDA LIGHT CHAINS
Kappa free light chain: 22.6 mg/L — ABNORMAL HIGH (ref 3.3–19.4)
Kappa, lambda light chain ratio: 1.09 (ref 0.26–1.65)
Lambda free light chains: 20.7 mg/L (ref 5.7–26.3)

## 2019-08-01 ENCOUNTER — Encounter: Payer: Self-pay | Admitting: Oncology

## 2019-08-01 LAB — PROTEIN ELECTRO, RANDOM URINE
Albumin ELP, Urine: 0 %
Alpha-1-Globulin, U: 0 %
Alpha-2-Globulin, U: 0 %
Beta Globulin, U: 0 %
Gamma Globulin, U: 0 %
Total Protein, Urine: <4 mg/dL

## 2019-08-01 NOTE — Progress Notes (Signed)
Hematology/Oncology Consult note [facility] Telephone:([phone] [phone]  Patient Care Team: Leonel Ramsay, MD as PCP - General (Infectious Diseases)   Name of the patient: Bonnie Crawford  [id number]  31-Oct-1940    Reason for referral- h/o MGUS   Referring physician- Dr. Ola Spurr  Date of visit: 08/01/19   History of presenting illness- Patient is a 79 year old female who was diagnosed with IgG MGUS in 2015.  At that time she underwent a skeletal survey which showed no evidence of lytic lesions.  Bone marrow biopsy in 2015 showed 3% plasma cells and normal cytogenetics.  Low magnitude M spike with no evidence of anemia renal insufficiency or hypercalcemia.  She was following up with hematology at [facility] for this and her last visit with them was in February 2020.  At that time M spike was no longer aberrant and she was asked to follow-up with her PCP.  Subsequently patient moved to [facility] and is here to reestablish follow-up  Currently patient feels well and denies any complaints.  Denies any new aches or pains anywhere.  Appetite and weight have remained stable.  ECOG PS- 1  Review of systems- Review of Systems  Constitutional: Negative for chills, fever, malaise/fatigue and weight loss.  HENT: Negative for congestion, ear discharge and nosebleeds.   Eyes: Negative for blurred vision.  Respiratory: Negative for cough, hemoptysis, sputum production, shortness of breath and wheezing.   Cardiovascular: Negative for chest pain, palpitations, orthopnea and claudication.  Gastrointestinal: Negative for abdominal pain, blood in stool, constipation, diarrhea, heartburn, melena, nausea and vomiting.  Genitourinary: Negative for dysuria, flank pain, frequency, hematuria and urgency.  Musculoskeletal: Negative for back pain, joint pain and myalgias.  Skin: Negative for rash.  Neurological: Negative for dizziness, tingling, focal  weakness, seizures, weakness and headaches.  Endo/Heme/Allergies: Does not bruise/bleed easily.  Psychiatric/Behavioral: Negative for depression and suicidal ideas. The patient does not have insomnia.     Allergies  Allergen Reactions  . Metoprolol Succinate  [Metoprolol Tartrate] Other (See Comments)    headache  . Other Rash    "Dexatone"-kidney medication  . Dexamethasone Rash    Patient Active Problem List   Diagnosis Date Noted  . MGUS (monoclonal gammopathy of unknown significance) 01/23/2019  . Osteoporosis 11/06/2018  . PAT (paroxysmal atrial tachycardia) ([place]) 03/23/2018  . Osteopenia with high risk of fracture 11/14/2017  . History of gastric ulcer 11/03/2017  . 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 Dysphagia 09/21/2017  . Bilateral leg edema 02/10/2017  . Atrophic vaginitis 10/31/2015  . Factor V Leiden mutation ([place]) 11/28/2014  . 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 Coronary atherosclerosis 07/30/2010  . Esophageal reflux 02/17/2010  . Hyperlipidemia LDL goal <70 02/12/2009  . Irritable bowel syndrome 11/08/2008  . Diverticulosis of colon 02/12/2008  . Essential hypertension 05/10/2007  . PVC's (premature ventricular contractions) 04/10/2007     Past Medical History:  Diagnosis Date  . Anxiety   . Arthritis   . Colon polyp   . Factor V Leiden ([place])   . GERD (gastroesophageal reflux disease)   . Herpes zoster   . Hyperlipidemia   . Hypertension   . Inflammatory bowel disease   . MGUS (monoclonal gammopathy of unknown significance)   . Osteoporosis   . PAT (paroxysmal atrial tachycardia) ([place])      Past Surgical History:  Procedure Laterality Date  . APPENDECTOMY    . COLONOSCOPY      Social History   Socioeconomic History  .  Marital status: Married    Spouse name: Not on file  . Number of children: Not on file  . Years of education: Not on file  . Highest education level: Not on file  Occupational History  . Not on file   Tobacco Use  . Smoking status: Former Research scientist (life sciences)  . Smokeless tobacco: Never Used  Substance and Sexual Activity  . Alcohol use: Not on file    Comment: once a year  . Drug use: Never  . Sexual activity: Not Currently    Birth control/protection: Post-menopausal  Other Topics Concern  . Not on file  Social History Narrative  . Not on file   Social Determinants of Health   Financial Resource Strain:   . Difficulty of Paying Living Expenses:   Food Insecurity:   . Worried About Charity fundraiser in the Last Year:   . Arboriculturist in the Last Year:   Transportation Needs:   . Film/video editor (Medical):   Marland Kitchen Lack of Transportation (Non-Medical):   Physical Activity:   . Days of Exercise per Week:   . Minutes of Exercise per Session:   Stress:   . Feeling of Stress :   Social Connections:   . Frequency of Communication with Friends and Family:   . Frequency of Social Gatherings with Friends and Family:   . Attends Religious Services:   . Active Member of Clubs or Organizations:   . Attends Archivist Meetings:   Marland Kitchen Marital Status:   Intimate Partner Violence:   . Fear of Current or Ex-Partner:   . Emotionally Abused:   Marland Kitchen Physically Abused:   . Sexually Abused:      Family History  Problem Relation Age of Onset  . Lung cancer Sister   . Breast cancer Sister   . Factor V Leiden deficiency Brother   . Lung cancer Brother   . Kidney cancer Brother   . Factor V Leiden deficiency Brother   . Factor V Leiden deficiency Brother   . Stomach cancer Brother   . Vasculitis Sister      Current Outpatient Medications:  .  acetaminophen (TYLENOL) 500 MG tablet, Take 500 mg by mouth every 6 (six) hours as needed. , Disp: , Rfl:  .  aspirin EC 81 MG tablet, Take 81 mg by mouth daily., Disp: , Rfl:  .  atenolol (TENORMIN) 25 MG tablet, Take by mouth daily., Disp: , Rfl:  .  atorvastatin (LIPITOR) 20 MG tablet, Take 20 mg by mouth daily., Disp: , Rfl:  .   Calcium Citrate-Vitamin D 315-250 MG-UNIT TABS, Take by mouth., Disp: , Rfl:  .  carbamide peroxide (DEBROX) 6.5 % OTIC solution, 5 drops as needed., Disp: , Rfl:  .  conjugated estrogens (PREMARIN) vaginal cream, 0.5g vaginally twice weekly, Disp: , Rfl:  .  mirabegron ER (MYRBETRIQ) 50 MG TB24 tablet, Take 1 tablet (50 mg total) by mouth daily., Disp: 90 tablet, Rfl: 3 .  Multiple Vitamin (MULTIVITAMIN) tablet, Take by mouth., Disp: , Rfl:  .  neomycin-polymyxin-hydrocortisone (CORTISPORIN) OTIC solution, Place 4 drops into both ears at bedtime., Disp: , Rfl:  .  omeprazole (PRILOSEC) 20 MG capsule, Take 20 mg by mouth daily as needed. , Disp: , Rfl:  .  oxybutynin (DITROPAN-XL) 5 MG 24 hr tablet, TAKE 1 TABLET BY MOUTH ONCE DAILY FOR 14 DAYS, THEN 2 TABLETS (10 MG TOTAL) ONCE DAILY FOR 30 DAYS., Disp: , Rfl:  .  psyllium (  METAMUCIL) 58.6 % powder, Take 1 packet by mouth daily as needed. , Disp: , Rfl:  .  Ubiquinol 100 MG CAPS, Take 100 mg by mouth daily. , Disp: , Rfl:  .  Cholecalciferol (VITAMIN D-1000 MAX ST) 25 MCG (1000 UT) tablet, Take by mouth., Disp: , Rfl:  .  diphenhydrAMINE (BENADRYL) 25 MG tablet, Take 25 mg by mouth every 6 (six) hours as needed. , Disp: , Rfl:  .  predniSONE (STERAPRED UNI-PAK 21 TAB) 10 MG (21) TBPK tablet, Take 6 tablets on the first day and decrease by 1 tablet each day until finished., Disp: 21 tablet, Rfl: 0 .  traMADol (ULTRAM) 50 MG tablet, Take 1 tablet (50 mg total) by mouth every 6 (six) hours as needed., Disp: 12 tablet, Rfl: 0 .  Turmeric 500 MG CAPS, Take by mouth., Disp: , Rfl:    Physical exam: There were no vitals filed for this visit. Physical Exam Constitutional:      General: She is not in acute distress. HENT:     Head: Normocephalic and atraumatic.  Eyes:     Pupils: Pupils are equal, round, and reactive to light.  Cardiovascular:     Rate and Rhythm: Normal rate and regular rhythm.     Heart sounds: Normal heart sounds.  Pulmonary:      Effort: Pulmonary effort is normal.     Breath sounds: Normal breath sounds.  Abdominal:     General: Bowel sounds are normal.     Palpations: Abdomen is soft.  Musculoskeletal:     Cervical back: Normal range of motion.  Skin:    General: Skin is warm and dry.  Neurological:     Mental Status: She is alert and oriented to person, place, and time.     Assessment and plan- Patient is a 79 y.o. female with history of IgG MGUS here to reestablish follow-up  As per her last hematology visit in February 2020 patient was not known to have any M spike.  Prior to that her MGUS was monitored conservatively and has not required any treatment so far.  She has also undergone bone marrow biopsy in the past which showed 3% plasma cells and there was no concern for progression to overt multiple myeloma.  Patient's most recent CBC on 07/16/2019 was normal with H&H of 14.1/44 and a platelet count of 311.  CMP was also normal with a calcium level of 9.6 and creatinine 1.  Today I will obtain myeloma panel as well as serum free light chains and random urine protein electrophoresis.  If she is not found to have any M spike in her serum she can continue to follow-up with Dr. Ola Spurr.  I will do a video visit with her in 2 weeks time to discuss the results of her blood work   Thank you for this kind referral and the opportunity to participate in the care of this  Patient   Visit Diagnosis 1. MGUS (monoclonal gammopathy of unknown significance)     Dr. Randa Evens, MD, MPH [facility] at [facility] [phone] 08/01/2019

## 2019-08-02 LAB — MULTIPLE MYELOMA PANEL, SERUM
Albumin SerPl Elph-Mcnc: 3.6 g/dL (ref 2.9–4.4)
Albumin/Glob SerPl: 1.3 (ref 0.7–1.7)
Alpha 1: 0.2 g/dL (ref 0.0–0.4)
Alpha2 Glob SerPl Elph-Mcnc: 0.8 g/dL (ref 0.4–1.0)
B-Globulin SerPl Elph-Mcnc: 1 g/dL (ref 0.7–1.3)
Gamma Glob SerPl Elph-Mcnc: 1 g/dL (ref 0.4–1.8)
Globulin, Total: 3 g/dL (ref 2.2–3.9)
IgA: 168 mg/dL (ref 64–422)
IgG (Immunoglobin G), Serum: 904 mg/dL (ref 586–1602)
IgM (Immunoglobulin M), Srm: 197 mg/dL (ref 26–217)
Total Protein ELP: 6.6 g/dL (ref 6.0–8.5)

## 2019-08-10 ENCOUNTER — Encounter: Payer: Self-pay | Admitting: Oncology

## 2019-08-10 ENCOUNTER — Other Ambulatory Visit: Payer: Self-pay

## 2019-08-10 ENCOUNTER — Inpatient Hospital Stay (HOSPITAL_BASED_OUTPATIENT_CLINIC_OR_DEPARTMENT_OTHER): Payer: Medicare PPO | Admitting: Oncology

## 2019-08-10 DIAGNOSIS — D472 Monoclonal gammopathy: Secondary | ICD-10-CM

## 2019-08-14 NOTE — Progress Notes (Signed)
I connected with Bonnie Crawford on 08/14/19 at  2:30 PM EDT by video enabled telemedicine visit and verified that I am speaking with the correct person using two identifiers.   I discussed the limitations, risks, security and privacy concerns of performing an evaluation and management service by telemedicine and the availability of in-person appointments. I also discussed with the patient that there may be a patient responsible charge related to this service. The patient expressed understanding and agreed to proceed.  Other persons participating in the visit and their role in the encounter:  none  Patient's location:  home Provider's location:  work  Risk analyst Complaint: Discuss results of blood work  History of present illness: Patient is a 79 year old female who was diagnosed with IgG MGUS in 2015.  At that time she underwent a skeletal survey which showed no evidence of lytic lesions.  Bone marrow biopsy in 2015 showed 3% plasma cells and normal cytogenetics.  Low magnitude M spike with no evidence of anemia renal insufficiency or hypercalcemia.  She was following up with hematology at [facility] for this and her last visit with them was in February 2020.  At that time M spike was no longer aberrant and she was asked to follow-up with her PCP.   Results of blood work from 07/30/2019 showed no M protein on SPEP or immunofixation.  Free kappa light chain was mildly elevated at 22 but free light chain ratio was normal at 1.09.  Random urine protein electrophoresis did not show any M protein.  Interval history patient is doing well and denies any complaints at this time   Review of Systems  Constitutional: Negative for chills, fever, malaise/fatigue and weight loss.  HENT: Negative for congestion, ear discharge and nosebleeds.   Eyes: Negative for blurred vision.  Respiratory: Negative for cough, hemoptysis, sputum production, shortness of breath and wheezing.   Cardiovascular: Negative for  chest pain, palpitations, orthopnea and claudication.  Gastrointestinal: Negative for abdominal pain, blood in stool, constipation, diarrhea, heartburn, melena, nausea and vomiting.  Genitourinary: Negative for dysuria, flank pain, frequency, hematuria and urgency.  Musculoskeletal: Negative for back pain, joint pain and myalgias.  Skin: Negative for rash.  Neurological: Negative for dizziness, tingling, focal weakness, seizures, weakness and headaches.  Endo/Heme/Allergies: Does not bruise/bleed easily.  Psychiatric/Behavioral: Negative for depression and suicidal ideas. The patient does not have insomnia.     Allergies  Allergen Reactions  . Metoprolol Succinate  [Metoprolol Tartrate] Other (See Comments)    headache  . Other Rash    "Dexatone"-kidney medication  . Dexamethasone Rash    Past Medical History:  Diagnosis Date  . Anxiety   . Arthritis   . Colon polyp   . Factor V Leiden ([place])   . GERD (gastroesophageal reflux disease)   . Herpes zoster   . Hyperlipidemia   . Hypertension   . Inflammatory bowel disease   . MGUS (monoclonal gammopathy of unknown significance)   . Osteoporosis   . PAT (paroxysmal atrial tachycardia) ([place])     Past Surgical History:  Procedure Laterality Date  . APPENDECTOMY    . COLONOSCOPY      Social History   Socioeconomic History  . Marital status: Married    Spouse name: Not on file  . Number of children: Not on file  . Years of education: Not on file  . Highest education level: Not on file  Occupational History  . Not on file  Tobacco Use  . Smoking status: Former Research scientist (life sciences)  .  Smokeless tobacco: Never Used  Substance and Sexual Activity  . Alcohol use: Not on file    Comment: once a year  . Drug use: Never  . Sexual activity: Not Currently    Birth control/protection: Post-menopausal  Other Topics Concern  . Not on file  Social History Narrative  . Not on file   Social Determinants of Health   Financial Resource  Strain:   . Difficulty of Paying Living Expenses:   Food Insecurity:   . Worried About Charity fundraiser in the Last Year:   . Arboriculturist in the Last Year:   Transportation Needs:   . Film/video editor (Medical):   Marland Kitchen Lack of Transportation (Non-Medical):   Physical Activity:   . Days of Exercise per Week:   . Minutes of Exercise per Session:   Stress:   . Feeling of Stress :   Social Connections:   . Frequency of Communication with Friends and Family:   . Frequency of Social Gatherings with Friends and Family:   . Attends Religious Services:   . Active Member of Clubs or Organizations:   . Attends Archivist Meetings:   Marland Kitchen Marital Status:   Intimate Partner Violence:   . Fear of Current or Ex-Partner:   . Emotionally Abused:   Marland Kitchen Physically Abused:   . Sexually Abused:     Family History  Problem Relation Age of Onset  . Lung cancer Sister   . Breast cancer Sister   . Factor V Leiden deficiency Brother   . Lung cancer Brother   . Kidney cancer Brother   . Factor V Leiden deficiency Brother   . Factor V Leiden deficiency Brother   . Stomach cancer Brother   . Vasculitis Sister      Current Outpatient Medications:  .  aspirin EC 81 MG tablet, Take 81 mg by mouth daily., Disp: , Rfl:  .  atenolol (TENORMIN) 25 MG tablet, Take by mouth daily., Disp: , Rfl:  .  atorvastatin (LIPITOR) 20 MG tablet, Take 20 mg by mouth daily., Disp: , Rfl:  .  Calcium Citrate-Vitamin D 315-250 MG-UNIT TABS, Take by mouth., Disp: , Rfl:  .  carbamide peroxide (DEBROX) 6.5 % OTIC solution, 5 drops as needed., Disp: , Rfl:  .  Cholecalciferol (VITAMIN D-1000 MAX ST) 25 MCG (1000 UT) tablet, Take by mouth., Disp: , Rfl:  .  diphenhydrAMINE (BENADRYL) 25 MG tablet, Take 25 mg by mouth every 6 (six) hours as needed. , Disp: , Rfl:  .  mirabegron ER (MYRBETRIQ) 50 MG TB24 tablet, Take 1 tablet (50 mg total) by mouth daily., Disp: 90 tablet, Rfl: 3 .  Multiple Vitamin  (MULTIVITAMIN) tablet, Take by mouth., Disp: , Rfl:  .  neomycin-polymyxin-hydrocortisone (CORTISPORIN) OTIC solution, Place 4 drops into both ears at bedtime., Disp: , Rfl:  .  omeprazole (PRILOSEC) 20 MG capsule, Take 20 mg by mouth daily as needed. , Disp: , Rfl:  .  oxybutynin (DITROPAN-XL) 5 MG 24 hr tablet, TAKE 1 TABLET BY MOUTH ONCE DAILY FOR 14 DAYS, THEN 2 TABLETS (10 MG TOTAL) ONCE DAILY FOR 30 DAYS., Disp: , Rfl:  .  predniSONE (STERAPRED UNI-PAK 21 TAB) 10 MG (21) TBPK tablet, Take 6 tablets on the first day and decrease by 1 tablet each day until finished., Disp: 21 tablet, Rfl: 0 .  psyllium (METAMUCIL) 58.6 % powder, Take 1 packet by mouth daily as needed. , Disp: , Rfl:  .  traMADol (ULTRAM) 50 MG tablet, Take 1 tablet (50 mg total) by mouth every 6 (six) hours as needed., Disp: 12 tablet, Rfl: 0 .  Turmeric 500 MG CAPS, Take by mouth., Disp: , Rfl:  .  Ubiquinol 100 MG CAPS, Take 100 mg by mouth daily. , Disp: , Rfl:  .  acetaminophen (TYLENOL) 500 MG tablet, Take 500 mg by mouth every 6 (six) hours as needed. , Disp: , Rfl:  .  conjugated estrogens (PREMARIN) vaginal cream, 0.5g vaginally twice weekly, Disp: , Rfl:    Observation/objective: Appears in no acute distress over video visit today.  Breathing is nonlabored  Assessment and plan: Patient is a 79 year old female referred to me for prior history of MGUS  Patient had monoclonal protein in her serum many years ago but subsequently was not found to have it on repeat testing.  I discussed the results of the blood work done 2 weeks ago which does not reveal any evidence of M protein on SPEP or immunofixation.  She has a normal hemoglobin and no evidence of kidney injury or elevated calcium.  Although her kappa light chain is mildly elevated the ratio is completely normal.  Random urine protein electrophoresis did not show any M protein.  I therefore do not feel the patient has any ongoing MGUS.  Patient will continue to  follow-up with Dr. Ola Spurr.  If patient develops any worsening anemia, kidney injury, hypercalcemia or new aches or pains that may suggest recurrence of MGUS/progression to multiple myeloma she could be referred to [place] in the future  Follow-up instructions: No follow-up needed  I discussed the assessment and treatment plan with the patient. The patient was provided an opportunity to ask questions and all were answered. The patient agreed with the plan and demonstrated an understanding of the instructions.   The patient was advised to call back or seek an in-person evaluation if the symptoms worsen or if the condition fails to improve as anticipated.    Visit Diagnosis: 1. MGUS (monoclonal gammopathy of unknown significance)     Dr. Randa Evens, MD, MPH [facility] at [facility] Tel- [phone] 08/14/2019 1:27 PM

## 2019-09-18 ENCOUNTER — Other Ambulatory Visit: Payer: Self-pay | Admitting: Infectious Diseases

## 2019-09-18 DIAGNOSIS — Z1231 Encounter for screening mammogram for malignant neoplasm of breast: Secondary | ICD-10-CM

## 2019-12-04 DIAGNOSIS — N1831 Chronic kidney disease, stage 3a: Secondary | ICD-10-CM | POA: Insufficient documentation

## 2019-12-05 ENCOUNTER — Other Ambulatory Visit: Payer: Self-pay | Admitting: Internal Medicine

## 2019-12-05 DIAGNOSIS — Z1231 Encounter for screening mammogram for malignant neoplasm of breast: Secondary | ICD-10-CM

## 2019-12-27 ENCOUNTER — Other Ambulatory Visit: Payer: Self-pay

## 2019-12-27 ENCOUNTER — Ambulatory Visit
Admission: RE | Admit: 2019-12-27 | Discharge: 2019-12-27 | Disposition: A | Discharge status: Home / Self Care | Payer: Medicare PPO | Source: Ambulatory Visit | Attending: Internal Medicine | Admitting: Internal Medicine

## 2019-12-27 DIAGNOSIS — Z1231 Encounter for screening mammogram for malignant neoplasm of breast: Secondary | ICD-10-CM

## 2019-12-27 IMAGING — MG DIGITAL SCREENING BILAT W/ TOMO W/ CAD
8 series · 8 of 24 positions shown · non-contrast
Comparison: Previous exam(s).

CLINICAL DATA: Screening.

EXAM:
DIGITAL SCREENING BILATERAL MAMMOGRAM WITH TOMO AND CAD

[L CC synth-2D]
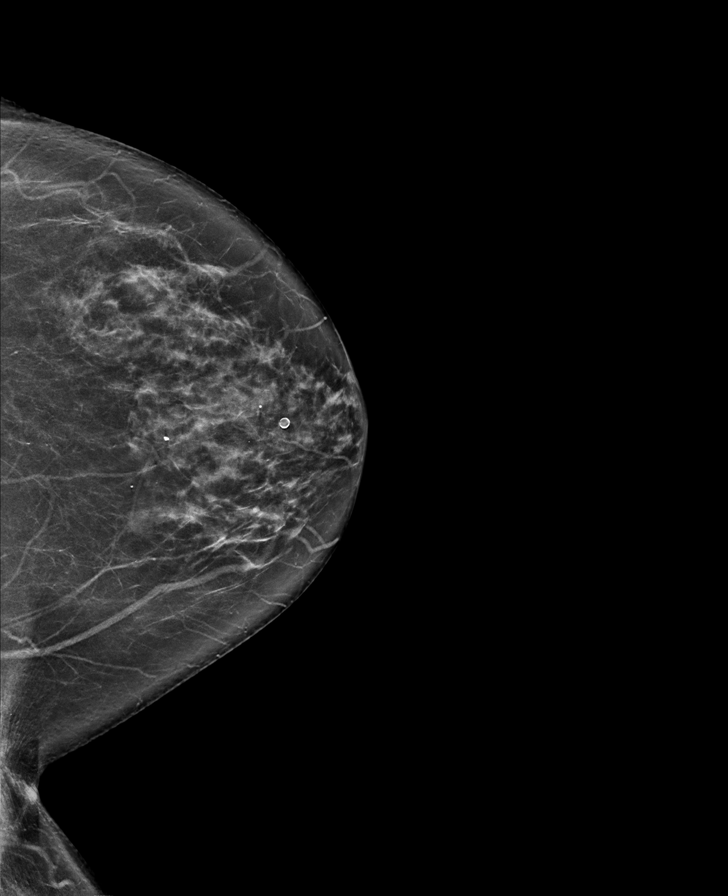

[R MLO synth-2D]
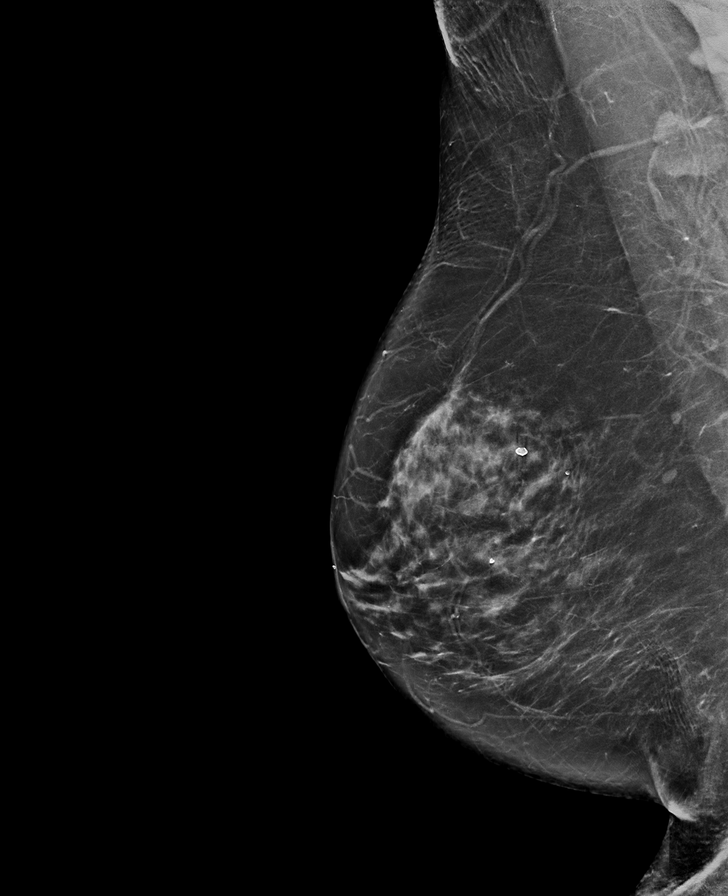

[L MLO synth-2D]
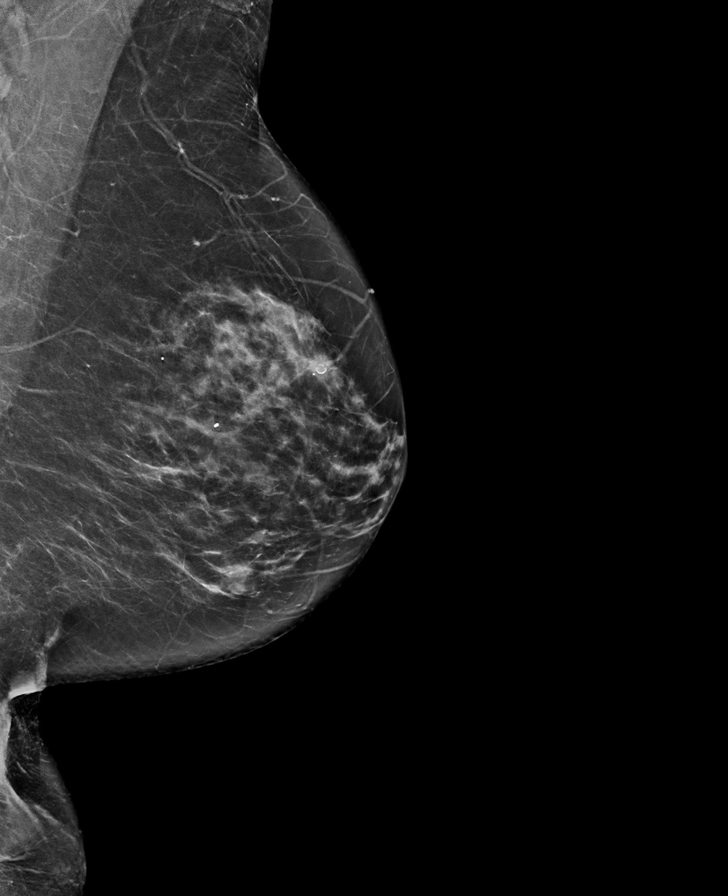

[R CC synth-2D]
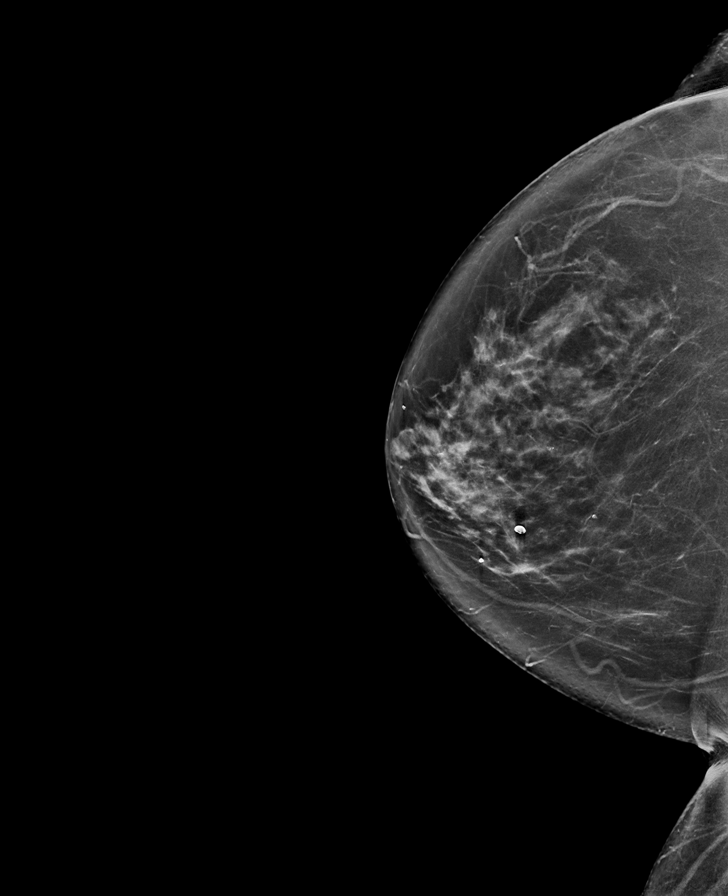

[R CC tomo · tomo slice 41/80.0]
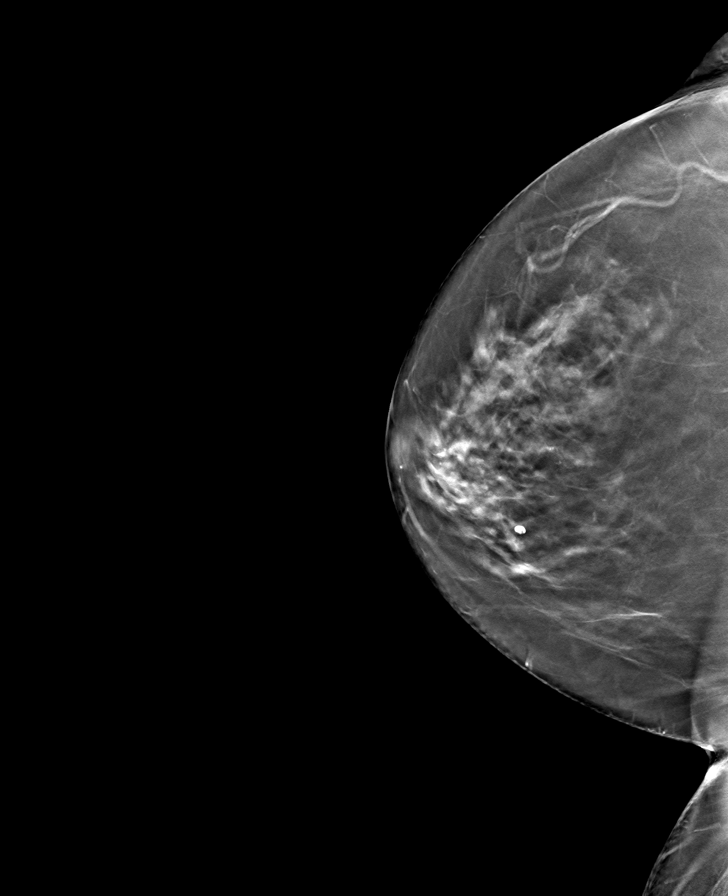

[R MLO tomo · tomo slice 40/79.0]
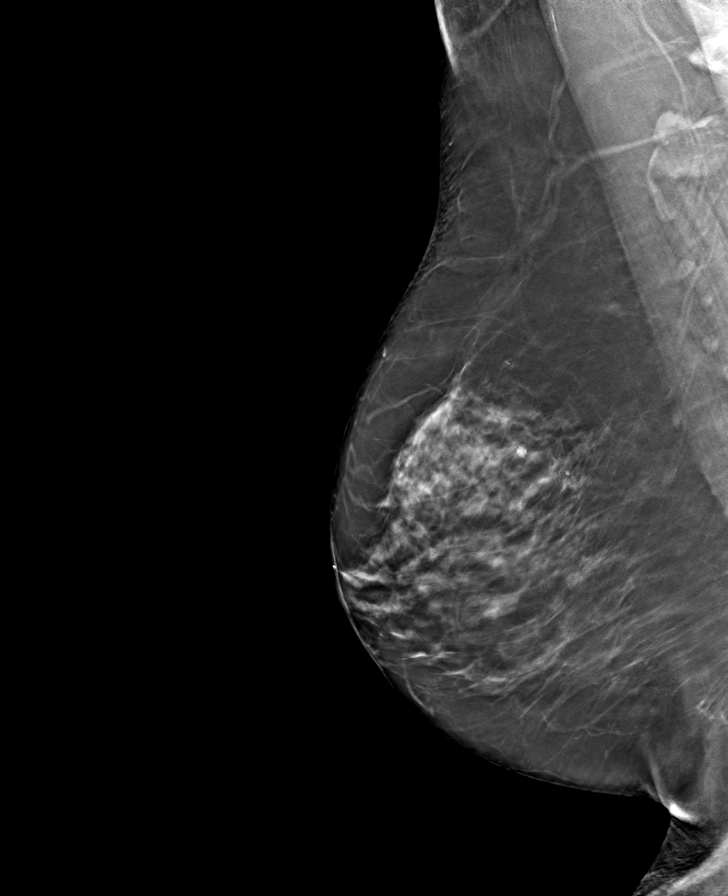

[L MLO tomo · tomo slice 37/73.0]
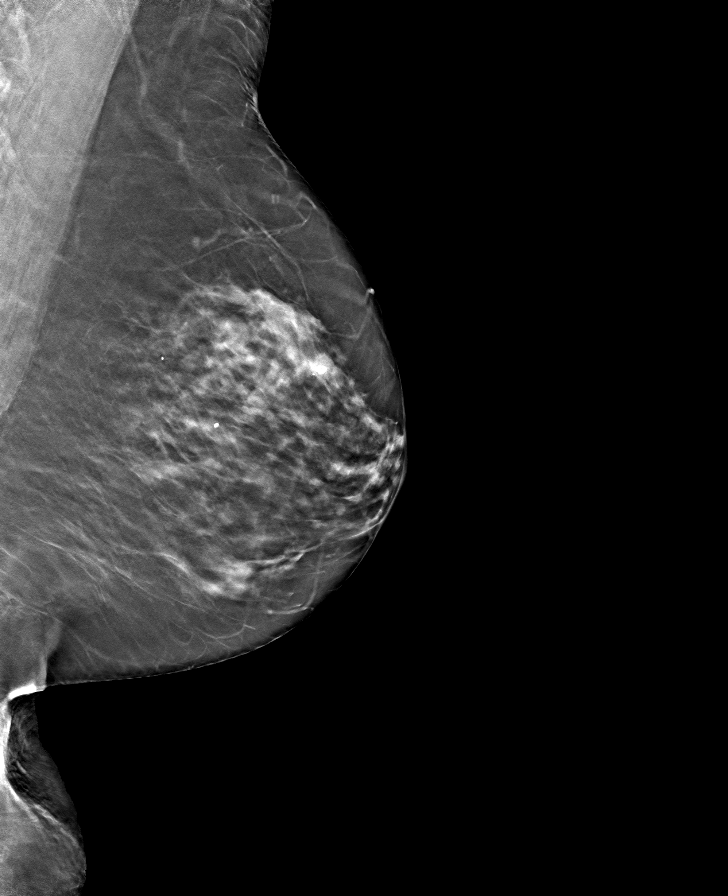

[L CC tomo · tomo slice 38/75.0]
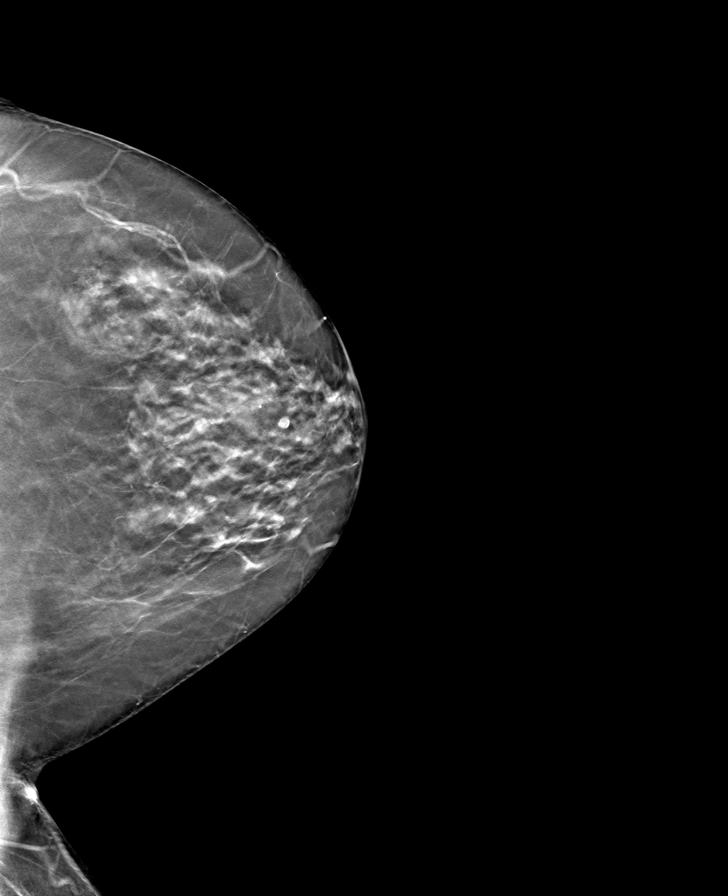

[8 of 24 positions shown; findings below may reference images not displayed]

ACR Breast Density Category c: The breast tissue is heterogeneously
dense, which may obscure small masses.
FINDINGS: There are no findings suspicious for malignancy. Images were
processed with CAD.
IMPRESSION: No mammographic evidence of malignancy. A result letter of this
screening mammogram will be mailed directly to the patient.

RECOMMENDATION:
Screening mammogram in one year. (Code:[5V])

BI-RADS CATEGORY  1: Negative.

## 2019-12-28 ENCOUNTER — Inpatient Hospital Stay
Admission: RE | Admit: 2019-12-28 | Discharge: 2019-12-28 | Disposition: A | Discharge status: Home / Self Care | Payer: Self-pay | Source: Ambulatory Visit | Attending: *Deleted | Admitting: *Deleted

## 2019-12-28 ENCOUNTER — Other Ambulatory Visit: Payer: Self-pay | Admitting: *Deleted

## 2019-12-28 DIAGNOSIS — Z1231 Encounter for screening mammogram for malignant neoplasm of breast: Secondary | ICD-10-CM

## 2020-02-11 ENCOUNTER — Encounter (INDEPENDENT_AMBULATORY_CARE_PROVIDER_SITE_OTHER): Payer: Self-pay | Admitting: Vascular Surgery

## 2020-03-03 ENCOUNTER — Ambulatory Visit (INDEPENDENT_AMBULATORY_CARE_PROVIDER_SITE_OTHER): Payer: Medicare PPO | Admitting: Vascular Surgery

## 2020-03-03 ENCOUNTER — Encounter (INDEPENDENT_AMBULATORY_CARE_PROVIDER_SITE_OTHER): Payer: Self-pay | Admitting: Vascular Surgery

## 2020-03-03 ENCOUNTER — Other Ambulatory Visit: Payer: Self-pay

## 2020-03-03 VITALS — BP 149/79 | HR 62 | Ht 62.0 in | Wt 163.0 lb

## 2020-03-03 DIAGNOSIS — I10 Essential (primary) hypertension: Secondary | ICD-10-CM

## 2020-03-03 DIAGNOSIS — K219 Gastro-esophageal reflux disease without esophagitis: Secondary | ICD-10-CM | POA: Diagnosis not present

## 2020-03-03 DIAGNOSIS — I25118 Atherosclerotic heart disease of native coronary artery with other forms of angina pectoris: Secondary | ICD-10-CM | POA: Diagnosis not present

## 2020-03-03 DIAGNOSIS — I890 Lymphedema, not elsewhere classified: Secondary | ICD-10-CM

## 2020-03-09 ENCOUNTER — Encounter (INDEPENDENT_AMBULATORY_CARE_PROVIDER_SITE_OTHER): Payer: Self-pay | Admitting: Vascular Surgery

## 2020-03-09 DIAGNOSIS — I890 Lymphedema, not elsewhere classified: Secondary | ICD-10-CM | POA: Insufficient documentation

## 2020-03-09 NOTE — Progress Notes (Signed)
MRN : [id number]  Bonnie Crawford is a 79 y.o. (02/02/1941) female who presents with chief complaint of  Chief Complaint  Patient presents with  . New Patient (Initial Visit)    [place]. LE edema  .  History of Present Illness:   Patient is seen for evaluation of leg pain and leg swelling. The patient first noticed the swelling remotely. The swelling is associated with pain and discoloration. The pain and swelling worsens with prolonged dependency and improves with elevation. The pain is unrelated to activity.  The patient notes that in the morning the legs are significantly improved but they steadily worsened throughout the course of the day. The patient also notes a steady worsening of the discoloration in the ankle and shin area.   The patient denies claudication symptoms.  The patient denies symptoms consistent with rest pain.  The patient denies and extensive history of DJD and LS spine disease.  The patient has no had any past angiography, interventions or vascular surgery.  Elevation makes the leg symptoms better, dependency makes them much worse. There is no history of ulcerations. The patient denies any recent changes in medications.  The patient has not been wearing graduated compression.  The patient denies a history of DVT or PE. There is no prior history of phlebitis. There is no history of primary lymphedema.  No history of malignancies. No history of trauma or groin or pelvic surgery. There is no history of radiation treatment to the groin or pelvis  The patient denies amaurosis fugax or recent TIA symptoms. There are no recent neurological changes noted. The patient denies recent episodes of angina or shortness of breath  Current Meds  Medication Sig  . acetaminophen (TYLENOL) 500 MG tablet Take 500 mg by mouth every 6 (six) hours as needed.   Marland Kitchen aspirin EC 81 MG tablet Take 81 mg by mouth daily.  Marland Kitchen atenolol (TENORMIN) 25 MG tablet Take by mouth daily.  Marland Kitchen  atenolol (TENORMIN) 25 MG tablet Take by mouth.  Marland Kitchen atorvastatin (LIPITOR) 20 MG tablet Take 20 mg by mouth daily.  . Calcium Citrate-Vitamin D 315-250 MG-UNIT TABS Take by mouth.  . carbamide peroxide (DEBROX) 6.5 % OTIC solution 5 drops as needed.  . Cholecalciferol (VITAMIN D-1000 MAX ST) 25 MCG (1000 UT) tablet Take by mouth.  . conjugated estrogens (PREMARIN) vaginal cream 0.5g vaginally twice weekly  . diphenhydrAMINE (BENADRYL) 25 MG tablet Take 25 mg by mouth every 6 (six) hours as needed.   . mirabegron ER (MYRBETRIQ) 50 MG TB24 tablet Take 1 tablet (50 mg total) by mouth daily.  . Multiple Vitamin (MULTIVITAMIN) tablet Take by mouth.  . neomycin-polymyxin-hydrocortisone (CORTISPORIN) OTIC solution Place 4 drops into both ears at bedtime.  Marland Kitchen omeprazole (PRILOSEC) 20 MG capsule Take 20 mg by mouth daily as needed.   Marland Kitchen oxybutynin (DITROPAN-XL) 5 MG 24 hr tablet TAKE 1 TABLET BY MOUTH ONCE DAILY FOR 14 DAYS, THEN 2 TABLETS (10 MG TOTAL) ONCE DAILY FOR 30 DAYS.  Marland Kitchen predniSONE (STERAPRED UNI-PAK 21 TAB) 10 MG (21) TBPK tablet Take 6 tablets on the first day and decrease by 1 tablet each day until finished.  . psyllium (METAMUCIL) 58.6 % powder Take 1 packet by mouth daily as needed.   . traMADol (ULTRAM) 50 MG tablet Take 1 tablet (50 mg total) by mouth every 6 (six) hours as needed.  . Turmeric 500 MG CAPS Take by mouth.  . Ubiquinol 100 MG CAPS Take 100 mg by mouth  daily.   Marland Kitchen UNABLE TO FIND Take by mouth.    Past Medical History:  Diagnosis Date  . Anxiety   . Arthritis   . Colon polyp   . Factor V Leiden ([place])   . GERD (gastroesophageal reflux disease)   . Herpes zoster   . Hyperlipidemia   . Hypertension   . Inflammatory bowel disease   . MGUS (monoclonal gammopathy of unknown significance)   . Osteoporosis   . PAT (paroxysmal atrial tachycardia) ([place])     Past Surgical History:  Procedure Laterality Date  . APPENDECTOMY    . BREAST CYST ASPIRATION Left    neg  .  COLONOSCOPY      Social History Social History   Tobacco Use  . Smoking status: Former Research scientist (life sciences)  . Smokeless tobacco: Never Used  Vaping Use  . Vaping Use: Never used  Substance Use Topics  . Alcohol use: Not on file    Comment: once a year  . Drug use: Never    Family History Family History  Problem Relation Age of Onset  . Lung cancer Sister   . Breast cancer Sister 49  . Factor V Leiden deficiency Brother   . Lung cancer Brother   . Kidney cancer Brother   . Factor V Leiden deficiency Brother   . Factor V Leiden deficiency Brother   . Stomach cancer Brother   . Vasculitis Sister   No family history of bleeding/clotting disorders, porphyria or autoimmune disease   Allergies  Allergen Reactions  . Metoprolol Succinate  [Metoprolol Tartrate] Other (See Comments)    headache  . Other Rash    "Dexatone"-kidney medication  . Dexamethasone Rash     REVIEW OF SYSTEMS (Negative unless checked)  Constitutional: [] Weight loss  [] Fever  [] Chills Cardiac: [] Chest pain   [] Chest pressure   [] Palpitations   [] Shortness of breath when laying flat   [] Shortness of breath with exertion. Vascular:  [] Pain in legs with walking   [x] Pain in legs at rest  [] History of DVT   [] Phlebitis   [x] Swelling in legs   [] Varicose veins   [] Non-healing ulcers Pulmonary:   [] Uses home oxygen   [] Productive cough   [] Hemoptysis   [] Wheeze  [] COPD   [] Asthma Neurologic:  [] Dizziness   [] Seizures   [] History of stroke   [] History of TIA  [] Aphasia   [] Vissual changes   [] Weakness or numbness in arm   [] Weakness or numbness in leg Musculoskeletal:   [] Joint swelling   [] Joint pain   [] Low back pain Hematologic:  [] Easy bruising  [] Easy bleeding   [] Hypercoagulable state   [] Anemic Gastrointestinal:  [] Diarrhea   [] Vomiting  [x] Gastroesophageal reflux/heartburn   [] Difficulty swallowing. Genitourinary:  [] Chronic kidney disease   [] Difficult urination  [] Frequent urination   [] Blood in urine Skin:   [] Rashes   [] Ulcers  Psychological:  [] History of anxiety   []  History of major depression.  Physical Examination  Vitals:   03/03/20 1507  BP: (!) 149/79  Pulse: 62  Weight: 163 lb (73.9 kg)  Height: 5\' 2"  (1.575 m)   Body mass index is 29.81 kg/m. Gen: WD/WN, NAD Head: [place]/AT, No temporalis wasting.  Ear/Nose/Throat: Hearing grossly intact, nares w/o erythema or drainage, poor dentition Eyes: PER, EOMI, sclera nonicteric.  Neck: Supple, no masses.  No bruit or JVD.  Pulmonary:  Good air movement, clear to auscultation bilaterally, no use of accessory muscles.  Cardiac: RRR, normal S1, S2, no Murmurs. Vascular: scattered varicosities present bilaterally.  Mild venous stasis changes to the legs bilaterally.  3+ soft pitting edema Vessel Right Left  Radial Palpable Palpable  PT Palpable Palpable  DP Palpable Palpable  Gastrointestinal: soft, non-distended. No guarding/no peritoneal signs.  Musculoskeletal: M/S 5/5 throughout.  No deformity or atrophy.  Neurologic: CN 2-12 intact. Pain and light touch intact in extremities.  Symmetrical.  Speech is fluent. Motor exam as listed above. Psychiatric: Judgment intact, Mood & affect appropriate for pt's clinical situation. Dermatologic: venous rashes no ulcers noted.  No changes consistent with cellulitis. Lymph : + lichenification or skin changes of chronic lymphedema.  CBC No results found for: WBC, HGB, HCT, MCV, PLT  BMET No results found for: NA, K, CL, CO2, GLUCOSE, BUN, CREATININE, CALCIUM, GFRNONAA, GFRAA CrCl cannot be calculated (No successful lab value found.).  COAG No results found for: INR, PROTIME  Radiology No results found.   Assessment/Plan 1. Lymphedema I have had a long discussion with the patient regarding swelling and why it  causes symptoms.  Patient will begin wearing graduated compression stockings class 1 (20-30 mmHg) on a daily basis a prescription was given. The patient will  beginning wearing the  stockings first thing in the morning and removing them in the evening. The patient is instructed specifically not to sleep in the stockings.   In addition, behavioral modification will be initiated.  This will include frequent elevation, use of over the counter pain medications and exercise such as walking.  I have reviewed systemic causes for chronic edema such as liver, kidney and cardiac etiologies.  The patient denies problems with these organ systems.    Consideration for a lymph pump will also be made based upon the effectiveness of conservative therapy.  This would help to improve the edema control and prevent sequela such as ulcers and infections   Patient should undergo duplex ultrasound of the venous system to ensure that DVT or reflux is not present.  The patient will follow-up with me after the ultrasound.   - VAS [place] LOWER EXTREMITY VENOUS REFLUX; Future  2. Atherosclerosis of native coronary artery of native heart with stable angina pectoris (HCC) Continue cardiac and antihypertensive medications as already ordered and reviewed, no changes at this time.  Continue statin as ordered and reviewed, no changes at this time  Nitrates PRN for chest pain   3. Essential hypertension Continue antihypertensive medications as already ordered, these medications have been reviewed and there are no changes at this time.   4. Gastroesophageal reflux disease, unspecified whether esophagitis present Continue PPI as already ordered, this medication has been reviewed and there are no changes at this time.  Avoidence of caffeine and alcohol  Moderate elevation of the head of the bed     Hortencia Pilar, MD  03/09/2020 12:56 PM

## 2020-04-02 ENCOUNTER — Ambulatory Visit: Payer: Medicare Other | Admitting: Urology

## 2020-04-28 ENCOUNTER — Ambulatory Visit (INDEPENDENT_AMBULATORY_CARE_PROVIDER_SITE_OTHER): Payer: Medicare PPO

## 2020-04-28 ENCOUNTER — Ambulatory Visit (INDEPENDENT_AMBULATORY_CARE_PROVIDER_SITE_OTHER): Payer: Medicare PPO | Admitting: Vascular Surgery

## 2020-04-28 ENCOUNTER — Encounter (INDEPENDENT_AMBULATORY_CARE_PROVIDER_SITE_OTHER): Payer: Self-pay | Admitting: Vascular Surgery

## 2020-04-28 ENCOUNTER — Other Ambulatory Visit: Payer: Self-pay

## 2020-04-28 VITALS — BP 159/79 | HR 50 | Ht 62.0 in | Wt 164.0 lb

## 2020-04-28 DIAGNOSIS — I25118 Atherosclerotic heart disease of native coronary artery with other forms of angina pectoris: Secondary | ICD-10-CM | POA: Diagnosis not present

## 2020-04-28 DIAGNOSIS — I10 Essential (primary) hypertension: Secondary | ICD-10-CM | POA: Diagnosis not present

## 2020-04-28 DIAGNOSIS — I890 Lymphedema, not elsewhere classified: Secondary | ICD-10-CM

## 2020-04-28 DIAGNOSIS — E785 Hyperlipidemia, unspecified: Secondary | ICD-10-CM

## 2020-04-28 DIAGNOSIS — K219 Gastro-esophageal reflux disease without esophagitis: Secondary | ICD-10-CM

## 2020-05-04 ENCOUNTER — Encounter (INDEPENDENT_AMBULATORY_CARE_PROVIDER_SITE_OTHER): Payer: Self-pay | Admitting: Vascular Surgery

## 2020-05-04 NOTE — Progress Notes (Signed)
MRN : [id number]  Bonnie Crawford is a 79 y.o. (05/30/1940) female who presents with chief complaint of  Chief Complaint  Patient presents with  . Follow-up    2 mo Bilateral vein reflux  .  History of Present Illness:   The patient returns to the office for followup evaluation regarding leg swelling.  The swelling has persisted and the pain associated with swelling continues. There have not been any interval development of a ulcerations or wounds.  Since the previous visit the patient has been wearing graduated compression stockings and has noted little if any improvement in the lymphedema. The patient has been using compression routinely morning until night.  The patient also states elevation during the day and exercise is being done too.   Current Meds  Medication Sig  . acetaminophen (TYLENOL) 500 MG tablet Take 500 mg by mouth every 6 (six) hours as needed.   Marland Kitchen aspirin EC 81 MG tablet Take 81 mg by mouth daily.  Marland Kitchen atenolol (TENORMIN) 25 MG tablet Take by mouth daily.  Marland Kitchen atenolol (TENORMIN) 25 MG tablet Take by mouth.  Marland Kitchen atorvastatin (LIPITOR) 20 MG tablet Take 20 mg by mouth daily.  . Calcium Citrate-Vitamin D 315-250 MG-UNIT TABS Take by mouth.  . carbamide peroxide (DEBROX) 6.5 % OTIC solution 5 drops as needed.  . Cholecalciferol (VITAMIN D-1000 MAX ST) 25 MCG (1000 UT) tablet Take by mouth.  . conjugated estrogens (PREMARIN) vaginal cream 0.5g vaginally twice weekly  . diphenhydrAMINE (BENADRYL) 25 MG tablet Take 25 mg by mouth every 6 (six) hours as needed.   . hydrochlorothiazide (HYDRODIURIL) 25 MG tablet   . Lifitegrast (XIIDRA) 5 % SOLN Apply to eye.  . mirabegron ER (MYRBETRIQ) 50 MG TB24 tablet Take 1 tablet (50 mg total) by mouth daily.  . Multiple Vitamin (MULTIVITAMIN) tablet Take by mouth.  . neomycin-polymyxin-hydrocortisone (CORTISPORIN) OTIC solution Place 4 drops into both ears at bedtime.  Marland Kitchen omeprazole (PRILOSEC) 20 MG capsule Take 20 mg by mouth  daily as needed.   Marland Kitchen oxybutynin (DITROPAN-XL) 5 MG 24 hr tablet TAKE 1 TABLET BY MOUTH ONCE DAILY FOR 14 DAYS, THEN 2 TABLETS (10 MG TOTAL) ONCE DAILY FOR 30 DAYS.  Marland Kitchen predniSONE (STERAPRED UNI-PAK 21 TAB) 10 MG (21) TBPK tablet Take 6 tablets on the first day and decrease by 1 tablet each day until finished.  . psyllium (METAMUCIL) 58.6 % powder Take 1 packet by mouth daily as needed.   . traMADol (ULTRAM) 50 MG tablet Take 1 tablet (50 mg total) by mouth every 6 (six) hours as needed.  . Turmeric 500 MG CAPS Take by mouth.  . Ubiquinol 100 MG CAPS Take 100 mg by mouth daily.   Marland Kitchen UNABLE TO FIND Take by mouth.    Past Medical History:  Diagnosis Date  . Anxiety   . Arthritis   . Colon polyp   . Factor V Leiden ([place])   . GERD (gastroesophageal reflux disease)   . Herpes zoster   . Hyperlipidemia   . Hypertension   . Inflammatory bowel disease   . MGUS (monoclonal gammopathy of unknown significance)   . Osteoporosis   . PAT (paroxysmal atrial tachycardia) ([place])     Past Surgical History:  Procedure Laterality Date  . APPENDECTOMY    . BREAST CYST ASPIRATION Left    neg  . COLONOSCOPY      Social History Social History   Tobacco Use  . Smoking status: Former Research scientist (life sciences)  . Smokeless  tobacco: Never Used  Vaping Use  . Vaping Use: Never used  Substance Use Topics  . Drug use: Never    Family History Family History  Problem Relation Age of Onset  . Lung cancer Sister   . Breast cancer Sister 65  . Factor V Leiden deficiency Brother   . Lung cancer Brother   . Kidney cancer Brother   . Factor V Leiden deficiency Brother   . Factor V Leiden deficiency Brother   . Stomach cancer Brother   . Vasculitis Sister     Allergies  Allergen Reactions  . Metoprolol Succinate  [Metoprolol Tartrate] Other (See Comments)    headache  . Other Rash    "Dexatone"-kidney medication  . Dexamethasone Rash     REVIEW OF SYSTEMS (Negative unless checked)  Constitutional: [] Weight  loss  [] Fever  [] Chills Cardiac: [] Chest pain   [] Chest pressure   [] Palpitations   [] Shortness of breath when laying flat   [] Shortness of breath with exertion. Vascular:  [] Pain in legs with walking   [x] Pain in legs at rest  [] History of DVT   [] Phlebitis   [x] Swelling in legs   [] Varicose veins   [] Non-healing ulcers Pulmonary:   [] Uses home oxygen   [] Productive cough   [] Hemoptysis   [] Wheeze  [] COPD   [] Asthma Neurologic:  [] Dizziness   [] Seizures   [] History of stroke   [] History of TIA  [] Aphasia   [] Vissual changes   [] Weakness or numbness in arm   [] Weakness or numbness in leg Musculoskeletal:   [] Joint swelling   [] Joint pain   [] Low back pain Hematologic:  [] Easy bruising  [] Easy bleeding   [x] Hypercoagulable state   [] Anemic Gastrointestinal:  [] Diarrhea   [] Vomiting  [x] Gastroesophageal reflux/heartburn   [] Difficulty swallowing. Genitourinary:  [] Chronic kidney disease   [] Difficult urination  [] Frequent urination   [] Blood in urine Skin:  [] Rashes   [] Ulcers  Psychological:  [] History of anxiety   []  History of major depression.  Physical Examination  Vitals:   04/28/20 1141  BP: (!) 159/79  Pulse: (!) 50  Weight: 164 lb (74.4 kg)  Height: 5\' 2"  (1.575 m)   Body mass index is 30 kg/m. Gen: WD/WN, NAD Head: [place]/AT, No temporalis wasting.  Ear/Nose/Throat: Hearing grossly intact, nares w/o erythema or drainage Eyes: PER, EOMI, sclera nonicteric.  Neck: Supple, no large masses.   Pulmonary:  Good air movement, no audible wheezing bilaterally, no use of accessory muscles.  Cardiac: RRR, no JVD Vascular: scattered varicosities present bilaterally.  Moderate venous stasis changes to the legs bilaterally.  3+ soft pitting edema. Vessel Right Left  Radial Palpable Palpable  Gastrointestinal: Non-distended. No guarding/no peritoneal signs.  Musculoskeletal: M/S 5/5 throughout.  No deformity or atrophy.  Neurologic: CN 2-12 intact. Symmetrical.  Speech is fluent. Motor exam  as listed above. Psychiatric: Judgment intact, Mood & affect appropriate for pt's clinical situation. Dermatologic: Moderate venous rashes no ulcers noted.  No changes consistent with cellulitis. Lymph : + lichenification and skin changes of chronic lymphedema.  CBC No results found for: WBC, HGB, HCT, MCV, PLT  BMET No results found for: NA, K, CL, CO2, GLUCOSE, BUN, CREATININE, CALCIUM, GFRNONAA, GFRAA CrCl cannot be calculated (No successful lab value found.).  COAG No results found for: INR, PROTIME  Radiology VAS [place] LOWER EXTREMITY VENOUS REFLUX  Result Date: 04/28/2020  Lower Venous Reflux Study Indications: Pain, and Swelling.  Performing Technologist: Charlane Ferretti RT (R)(VS)  Examination Guidelines: A complete evaluation includes B-mode imaging,  spectral Doppler, color Doppler, and power Doppler as needed of all accessible portions of each vessel. Bilateral testing is considered an integral part of a complete examination. Limited examinations for reoccurring indications may be performed as noted. The reflux portion of the exam is performed with the patient in reverse Trendelenburg. Significant venous reflux is defined as >500 ms in the superficial venous system, and >1 second in the deep venous system.  Venous Reflux Times +--------------+---------+----------+--------+------------+--------------------+ RIGHT         Reflux NoReflux Yes Reflux Diameter cmsComments                                                Time                                   +--------------+---------+----------+--------+------------+--------------------+ CFV                       yes    1394 ms                                  +--------------+---------+----------+--------+------------+--------------------+ FV mid                    yes    1460 ms                                  +--------------+---------+----------+--------+------------+--------------------+ Popliteal                  yes    1599 ms                                  +--------------+---------+----------+--------+------------+--------------------+ GSV at SFJ                yes     689 ms                                  +--------------+---------+----------+--------+------------+--------------------+ GSV prox thigh                                       prior                                                                     ablation/stripping   +--------------+---------+----------+--------+------------+--------------------+ GSV mid thigh                                        prior  ablation/stripping   +--------------+---------+----------+--------+------------+--------------------+ GSV dist thigh                                       prior                                                                     ablation/stripping   +--------------+---------+----------+--------+------------+--------------------+ GSV at knee                                          prior                                                                     ablation/stripping   +--------------+---------+----------+--------+------------+--------------------+ SSV Pop Fossa no                                                          +--------------+---------+----------+--------+------------+--------------------+  +--------------+---------+----------+--------+------------+--------------------+ LEFT          Reflux NoReflux Yes Reflux Diameter cmsComments                                                Time                                   +--------------+---------+----------+--------+------------+--------------------+ CFV                       yes    1269 ms                                  +--------------+---------+----------+--------+------------+--------------------+ FV mid        no                                                           +--------------+---------+----------+--------+------------+--------------------+ Popliteal     no                                                          +--------------+---------+----------+--------+------------+--------------------+ GSV at [facility]  no                                                          +--------------+---------+----------+--------+------------+--------------------+ GSV prox thigh                                       prior                                                                     ablation/stripping   +--------------+---------+----------+--------+------------+--------------------+ GSV mid thigh                                        prior                                                                     ablation/stripping   +--------------+---------+----------+--------+------------+--------------------+ GSV dist thigh                                       prior                                                                     ablation/stripping   +--------------+---------+----------+--------+------------+--------------------+ GSV at knee                                          prior                                                                     ablation/stripping   +--------------+---------+----------+--------+------------+--------------------+ SSV Pop Fossa no                                                          +--------------+---------+----------+--------+------------+--------------------+  Summary: Bilateral: - No evidence of deep vein thrombosis seen in the lower extremities, bilaterally, from the common  femoral through the popliteal veins. - No evidence of superficial venous thrombosis in the lower extremities, bilaterally. - No evidence of superficial venous reflux seen in the short saphenous veins bilaterally.  Right: - Venous reflux is  noted in the right common femoral vein. - Venous reflux is noted in the right sapheno-femoral junction. - Venous reflux is noted in the right femoral vein. - Venous reflux is noted in the right popliteal vein.  Left: - Venous reflux is noted in the left common femoral vein. - Left anterior lateral distal calf demonstrates a solid nodule measuring approximately 1.46cm x .96cm with vascularity.  *See table(s) above for measurements and observations. Electronically signed by Hortencia Pilar MD on 04/28/2020 at 4:35:44 PM.    Final      Assessment/Plan 1. Lymphedema Recommend:  No surgery or intervention at this point in time.    I have reviewed my previous discussion with the patient regarding swelling and why it causes symptoms.  Patient will continue wearing graduated compression stockings class 1 (20-30 mmHg) on a daily basis. The patient will  beginning wearing the stockings first thing in the morning and removing them in the evening. The patient is instructed specifically not to sleep in the stockings.    In addition, behavioral modification including several periods of elevation of the lower extremities during the day will be continued.  This was reviewed with the patient during the initial visit.  The patient will also continue routine exercise, especially walking on a daily basis as was discussed during the initial visit.    Despite conservative treatments including graduated compression therapy class 1 and behavioral modification including exercise and elevation the patient  has not obtained adequate control of the lymphedema.  The patient still has stage 3 lymphedema and therefore, I believe that a lymph pump should be added to improve the control of the patient's lymphedema.  Additionally, a lymph pump is warranted because it will reduce the risk of cellulitis and ulceration in the future.  Patient should follow-up in six months    2. Hyperlipidemia LDL goal <70 Continue statin as  ordered and reviewed, no changes at this time   3. Essential hypertension Continue antihypertensive medications as already ordered, these medications have been reviewed and there are no changes at this time.   4. Atherosclerosis of native coronary artery of native heart with stable angina pectoris (HCC) Continue cardiac and antihypertensive medications as already ordered and reviewed, no changes at this time.  Continue statin as ordered and reviewed, no changes at this time  Nitrates PRN for chest pain   5. Gastroesophageal reflux disease, unspecified whether esophagitis present Continue PPI as already ordered, this medication has been reviewed and there are no changes at this time.  Avoidence of caffeine and alcohol  Moderate elevation of the head of the bed     Hortencia Pilar, MD  05/04/2020 9:44 AM

## 2020-05-17 DIAGNOSIS — Z9841 Cataract extraction status, right eye: Secondary | ICD-10-CM

## 2020-05-17 HISTORY — DX: Cataract extraction status, left eye: Z98.41

## 2020-07-21 ENCOUNTER — Encounter: Payer: Self-pay | Admitting: Ophthalmology

## 2020-07-21 ENCOUNTER — Other Ambulatory Visit: Payer: Self-pay

## 2020-07-24 ENCOUNTER — Other Ambulatory Visit
Admission: RE | Admit: 2020-07-24 | Discharge: 2020-07-24 | Disposition: A | Discharge status: Home / Self Care | Payer: Medicare PPO | Source: Ambulatory Visit | Attending: Ophthalmology | Admitting: Ophthalmology

## 2020-07-24 ENCOUNTER — Other Ambulatory Visit: Payer: Self-pay

## 2020-07-24 DIAGNOSIS — Z01812 Encounter for preprocedural laboratory examination: Secondary | ICD-10-CM | POA: Insufficient documentation

## 2020-07-24 DIAGNOSIS — Z20822 Contact with and (suspected) exposure to covid-19: Secondary | ICD-10-CM | POA: Diagnosis not present

## 2020-07-24 LAB — SARS CORONAVIRUS 2 (TAT 6-24 HRS): SARS Coronavirus 2: NEGATIVE

## 2020-07-24 NOTE — Discharge Instructions (Signed)

## 2020-07-28 ENCOUNTER — Ambulatory Visit
Admission: RE | Admit: 2020-07-28 | Discharge: 2020-07-28 | Disposition: A | Discharge status: Home / Self Care | Payer: Medicare PPO | Attending: Ophthalmology | Admitting: Ophthalmology

## 2020-07-28 ENCOUNTER — Ambulatory Visit: Payer: Medicare PPO | Admitting: Anesthesiology

## 2020-07-28 ENCOUNTER — Other Ambulatory Visit: Payer: Self-pay

## 2020-07-28 ENCOUNTER — Encounter: Payer: Self-pay | Admitting: Ophthalmology

## 2020-07-28 ENCOUNTER — Encounter
Admission: RE | Disposition: A | Discharge status: Home / Self Care | Payer: Self-pay | Source: Home / Self Care | Attending: Ophthalmology

## 2020-07-28 DIAGNOSIS — Z87891 Personal history of nicotine dependence: Secondary | ICD-10-CM | POA: Diagnosis not present

## 2020-07-28 DIAGNOSIS — Z803 Family history of malignant neoplasm of breast: Secondary | ICD-10-CM | POA: Insufficient documentation

## 2020-07-28 DIAGNOSIS — Z801 Family history of malignant neoplasm of trachea, bronchus and lung: Secondary | ICD-10-CM | POA: Insufficient documentation

## 2020-07-28 DIAGNOSIS — Z7982 Long term (current) use of aspirin: Secondary | ICD-10-CM | POA: Diagnosis not present

## 2020-07-28 DIAGNOSIS — Z800 Family history of malignant neoplasm of digestive organs: Secondary | ICD-10-CM | POA: Insufficient documentation

## 2020-07-28 DIAGNOSIS — H2512 Age-related nuclear cataract, left eye: Secondary | ICD-10-CM | POA: Insufficient documentation

## 2020-07-28 DIAGNOSIS — Z79899 Other long term (current) drug therapy: Secondary | ICD-10-CM | POA: Insufficient documentation

## 2020-07-28 DIAGNOSIS — Z888 Allergy status to other drugs, medicaments and biological substances status: Secondary | ICD-10-CM | POA: Insufficient documentation

## 2020-07-28 DIAGNOSIS — Z8249 Family history of ischemic heart disease and other diseases of the circulatory system: Secondary | ICD-10-CM | POA: Diagnosis not present

## 2020-07-28 DIAGNOSIS — Z832 Family history of diseases of the blood and blood-forming organs and certain disorders involving the immune mechanism: Secondary | ICD-10-CM | POA: Diagnosis not present

## 2020-07-28 DIAGNOSIS — Z8051 Family history of malignant neoplasm of kidney: Secondary | ICD-10-CM | POA: Insufficient documentation

## 2020-07-28 HISTORY — DX: Unspecified asthma, uncomplicated: J45.909

## 2020-07-28 HISTORY — PX: CATARACT EXTRACTION W/PHACO: SHX586

## 2020-07-28 SURGERY — PHACOEMULSIFICATION, CATARACT, WITH IOL INSERTION
Anesthesia: Monitor Anesthesia Care | Site: Eye | Laterality: Left

## 2020-07-28 MED ORDER — SODIUM HYALURONATE 10 MG/ML IO SOLN
INTRAOCULAR | Status: DC | PRN
  Administered 2020-07-28: 0.55 mL via INTRAOCULAR

## 2020-07-28 MED ORDER — TETRACAINE HCL 0.5 % OP SOLN
1.0000 [drp] | OPHTHALMIC | Status: DC | PRN
  Administered 2020-07-28 (×3): 1 [drp] via OPHTHALMIC

## 2020-07-28 MED ORDER — ACETAMINOPHEN 160 MG/5ML PO SOLN: 325.0000 mg | ORAL | Status: DC | PRN

## 2020-07-28 MED ORDER — ACETAMINOPHEN 325 MG PO TABS
325.0000 mg | ORAL_TABLET | ORAL | Status: DC | PRN
Start: 2020-07-28 — End: 2020-07-28

## 2020-07-28 MED ORDER — MIDAZOLAM HCL 2 MG/2ML IJ SOLN
INTRAMUSCULAR | Status: DC | PRN
  Administered 2020-07-28: 1 mg via INTRAVENOUS

## 2020-07-28 MED ORDER — EPINEPHRINE PF 1 MG/ML IJ SOLN
INTRAOCULAR | Status: DC | PRN
  Administered 2020-07-28: 77 mL via OPHTHALMIC

## 2020-07-28 MED ORDER — LIDOCAINE HCL (PF) 2 % IJ SOLN
INTRAOCULAR | Status: DC | PRN
  Administered 2020-07-28: 1 mL via INTRAOCULAR

## 2020-07-28 MED ORDER — ARMC OPHTHALMIC DILATING DROPS
1.0000 "application " | OPHTHALMIC | Status: DC | PRN
  Administered 2020-07-28 (×3): 1 via OPHTHALMIC

## 2020-07-28 MED ORDER — ONDANSETRON HCL 4 MG/2ML IJ SOLN: 4.0000 mg | Freq: Once | INTRAMUSCULAR | Status: DC | PRN

## 2020-07-28 MED ORDER — FENTANYL CITRATE (PF) 100 MCG/2ML IJ SOLN
INTRAMUSCULAR | Status: DC | PRN
  Administered 2020-07-28: 50 ug via INTRAVENOUS

## 2020-07-28 MED ORDER — MOXIFLOXACIN HCL 0.5 % OP SOLN
OPHTHALMIC | Status: DC | PRN
  Administered 2020-07-28: 0.2 mL via OPHTHALMIC

## 2020-07-28 MED ORDER — SODIUM HYALURONATE 23 MG/ML IO SOLN
INTRAOCULAR | Status: DC | PRN
  Administered 2020-07-28: 0.6 mL via INTRAOCULAR

## 2020-07-28 SURGICAL SUPPLY — 19 items
CANNULA ANT/CHMB 27G (MISCELLANEOUS) ×2 IMPLANT
CANNULA ANT/CHMB 27GA (MISCELLANEOUS) ×4 IMPLANT
DISSECTOR HYDRO NUCLEUS 50X22 (MISCELLANEOUS) ×2 IMPLANT
GLOVE SURG SYN 8.5  E (GLOVE) ×1
GLOVE SURG SYN 8.5 E (GLOVE) ×1 IMPLANT
GLOVE SURG SYN 8.5 PF PI (GLOVE) ×1 IMPLANT
GOWN STRL REUS W/ TWL LRG LVL3 (GOWN DISPOSABLE) ×2 IMPLANT
GOWN STRL REUS W/TWL LRG LVL3 (GOWN DISPOSABLE) ×4
LENS IOL IQ PAN TRC 40 24.5 IMPLANT
LENS IOL PANOP TORIC 40 24.5 ×1 IMPLANT
LENS IOL PANOPTIX TORIC 24.5 ×2 IMPLANT
MARKER SKIN DUAL TIP RULER LAB (MISCELLANEOUS) ×2 IMPLANT
PACK DR. KING ARMS (PACKS) ×2 IMPLANT
PACK EYE AFTER SURG (MISCELLANEOUS) ×2 IMPLANT
PACK OPTHALMIC (MISCELLANEOUS) ×2 IMPLANT
SYR 3ML LL SCALE MARK (SYRINGE) ×2 IMPLANT
SYR TB 1ML LUER SLIP (SYRINGE) ×2 IMPLANT
WATER STERILE IRR 250ML POUR (IV SOLUTION) ×2 IMPLANT
WIPE NON LINTING 3.25X3.25 (MISCELLANEOUS) ×2 IMPLANT

## 2020-07-28 NOTE — Anesthesia Postprocedure Evaluation (Signed)
Anesthesia Post Note  Patient: Bonnie Crawford  Procedure(s) Performed: CATARACT EXTRACTION PHACO AND INTRAOCULAR LENS PLACEMENT (IOC) LEFT PANOPTIX TORIC 4.25 00:30.9 (Left Eye)     Patient location during evaluation: PACU Anesthesia Type: MAC Level of consciousness: awake Pain management: pain level controlled Vital Signs Assessment: post-procedure vital signs reviewed and stable Respiratory status: respiratory function stable Cardiovascular status: stable Postop Assessment: no apparent nausea or vomiting Anesthetic complications: no   No complications documented.  Veda Canning

## 2020-07-28 NOTE — Anesthesia Preprocedure Evaluation (Addendum)
Anesthesia Evaluation  Patient identified by MRN, date of birth, ID band Patient awake    Reviewed: Allergy & Precautions, NPO status   Airway Mallampati: II  TM Distance: >3 FB     Dental   Pulmonary former smoker,  Lingering cough from PND related to resolving URI   breath sounds clear to auscultation       Cardiovascular hypertension, + dysrhythmias (paroxysmal atrial tachycardia)  Rhythm:Regular Rate:Normal  HLD   Neuro/Psych Anxiety    GI/Hepatic GERD  ,Inflammatory bowel disease   Endo/Other    Renal/GU Renal disease (stage 3 CKD)     Musculoskeletal  (+) Arthritis , Osteoporosis   Abdominal   Peds  Hematology MGUS Factor V Leiden   Anesthesia Other Findings   Reproductive/Obstetrics                            Anesthesia Physical Anesthesia Plan  ASA: III  Anesthesia Plan: MAC   Post-op Pain Management:    Induction: Intravenous  PONV Risk Score and Plan: TIVA and Treatment may vary due to age or medical condition  Airway Management Planned: Natural Airway and Nasal Cannula  Additional Equipment:   Intra-op Plan:   Post-operative Plan:   Informed Consent: I have reviewed the patients History and Physical, chart, labs and discussed the procedure including the risks, benefits and alternatives for the proposed anesthesia with the patient or authorized representative who has indicated his/her understanding and acceptance.       Plan Discussed with: CRNA  Anesthesia Plan Comments:         Anesthesia Quick Evaluation

## 2020-07-28 NOTE — Op Note (Signed)
OPERATIVE NOTE  Bonnie Crawford [id number] 07/28/2020   PREOPERATIVE DIAGNOSIS:  Nuclear sclerotic cataract left eye.  H25.12   POSTOPERATIVE DIAGNOSIS:    Nuclear sclerotic cataract left eye.     PROCEDURE:  Phacoemusification with posterior chamber intraocular lens placement of the left eye   LENS:   Implant Name Type Inv. Item Serial No. Manufacturer Lot No. LRB No. Used Action  LENS IOL PANOPTIX TORIC 24.5 - [id number]  LENS IOL PANOPTIX TORIC 24.5 [id number] ALCON  Left 1 Implanted      Procedure(s): CATARACT EXTRACTION PHACO AND INTRAOCULAR LENS PLACEMENT (IOC) LEFT PANOPTIX TORIC 4.25 00:30.9 (Left)  TFNT40 +24.5 Panoptix toric at 001 degree    ULTRASOUND TIME: 0 minutes 30 seconds.  CDE 4.25   SURGEON:  Benay Pillow, MD, MPH   ANESTHESIA:  Topical with tetracaine drops augmented with 1% preservative-free intracameral lidocaine.  ESTIMATED BLOOD LOSS: <1 mL   COMPLICATIONS:  None.   DESCRIPTION OF PROCEDURE:  The patient was identified in the holding room and transported to the operating room and placed in the supine position under the operating microscope.  The left eye was identified as the operative eye and it was prepped and draped in the usual sterile ophthalmic fashion.  The verion was registered without difficulty.   A 1.0 millimeter clear-corneal paracentesis was made at the 5:00 position. 0.5 ml of preservative-free 1% lidocaine with epinephrine was injected into the anterior chamber.  The anterior chamber was filled with Healon 5 viscoelastic.  A 2.4 millimeter keratome was used to make a near-clear corneal incision at the 2:00 position.  A curvilinear capsulorrhexis was made with a cystotome and capsulorrhexis forceps.  Balanced salt solution was used to hydrodissect and hydrodelineate the nucleus.   Phacoemulsification was then used in stop and chop fashion to remove the lens nucleus and epinucleus.  The remaining cortex was then removed using the  irrigation and aspiration handpiece. Healon was then placed into the capsular bag to distend it for lens placement.  A lens was then injected into the capsular bag.  The remaining viscoelastic was aspirated.  The lens was rotated to 001 degree with assistance from the [place].  The lens was well aligned and centered, and the viscoelastic from behind the lens had been aspirated and the eye was left with a lower than normal pressure.   Wounds were hydrated with balanced salt solution.  The anterior chamber was inflated to a physiologic pressure with balanced salt solution.  Intracameral vigamox 0.1 mL undiltued was injected into the eye and a drop placed onto the ocular surface.  No wound leaks were noted.  The patient was taken to the recovery room in stable condition without complications of anesthesia or surgery  Benay Pillow 07/28/2020, 10:51 AM

## 2020-07-28 NOTE — Anesthesia Procedure Notes (Signed)
Procedure Name: MAC Date/Time: 07/28/2020 10:31 AM Performed by: Cameron Ali, CRNA Pre-anesthesia Checklist: Patient identified, Emergency Drugs available, Suction available, Timeout performed and Patient being monitored Patient Re-evaluated:Patient Re-evaluated prior to induction Oxygen Delivery Method: Nasal cannula Placement Confirmation: positive ETCO2

## 2020-07-28 NOTE — H&P (Signed)
[facility]   Primary Care Physician:  Idelle Crouch, MD Ophthalmologist: Dr. Benay Pillow  Pre-Procedure History & Physical: HPI:  Bonnie Crawford is a 80 y.o. female here for cataract surgery.   Past Medical History:  Diagnosis Date  . Anxiety   . Arthritis   . Asthma    childhood  . Colon polyp   . Factor V Leiden ([place])   . GERD (gastroesophageal reflux disease)   . Herpes zoster   . Hyperlipidemia   . Hypertension   . Inflammatory bowel disease   . MGUS (monoclonal gammopathy of unknown significance)   . Osteoporosis   . PAT (paroxysmal atrial tachycardia) ([place])     Past Surgical History:  Procedure Laterality Date  . APPENDECTOMY    . BREAST CYST ASPIRATION Left    neg  . COLONOSCOPY      Prior to Admission medications   Medication Sig Start Date End Date Taking? Authorizing Provider  acetaminophen (TYLENOL) 500 MG tablet Take 500 mg by mouth every 6 (six) hours as needed.    Yes [provider]  aspirin EC 81 MG tablet Take 81 mg by mouth daily.   Yes [provider]  atenolol (TENORMIN) 25 MG tablet Take by mouth. 11/27/19  Yes [provider]  atorvastatin (LIPITOR) 20 MG tablet Take 20 mg by mouth daily.   Yes [provider]  Calcium Citrate-Vitamin D 315-250 MG-UNIT TABS Take by mouth.   Yes [provider]  carbamide peroxide (DEBROX) 6.5 % OTIC solution 5 drops as needed.   Yes [provider]  carboxymethylcellulose (REFRESH PLUS) 0.5 % SOLN 1 drop 2 (two) times daily as needed.   Yes [provider]  Cholecalciferol (VITAMIN D-1000 MAX ST) 25 MCG (1000 UT) tablet Take by mouth.   Yes [provider]  COLLAGEN PO Take by mouth daily.   Yes [provider]  conjugated estrogens (PREMARIN) vaginal cream 0.5g vaginally twice weekly 02/09/16  Yes [provider]  diphenhydrAMINE (BENADRYL) 25 MG tablet Take 25 mg by mouth every 6 (six) hours as needed.    Yes  [provider]  hydrochlorothiazide (HYDRODIURIL) 25 MG tablet 3 (three) times a week. M, W, F 04/23/20  Yes [provider]  Lifitegrast Shirley Friar) 5 % SOLN Apply to eye.   Yes [provider]  Magnesium 500 MG TABS Take by mouth 3 (three) times a week.   Yes [provider]  mirabegron ER (MYRBETRIQ) 50 MG TB24 tablet Take 1 tablet (50 mg total) by mouth daily. 04/06/19  Yes Billey Co, MD  Multiple Vitamin (MULTIVITAMIN) tablet Take by mouth.   Yes [provider]  neomycin-polymyxin-hydrocortisone (CORTISPORIN) OTIC solution Place 4 drops into both ears at bedtime.   Yes [provider]  Nutritional Supplements (KETO PO) Take by mouth in the morning and at bedtime. Keto Strong and Keto Strong Detox   Yes [provider]  Omega-3 Fatty Acids (OMEGA-3 PLUS PO) Take by mouth 3 (three) times a week.   Yes [provider]  omeprazole (PRILOSEC) 20 MG capsule Take 20 mg by mouth daily as needed.  11/07/18  Yes [provider]  predniSONE (STERAPRED UNI-PAK 21 TAB) 10 MG (21) TBPK tablet Take 6 tablets on the first day and decrease by 1 tablet each day until finished. 01/21/19  Yes Triplett, Cari B, FNP  psyllium (METAMUCIL) 58.6 % powder Take 1 packet by mouth daily as needed.    Yes  [provider]  Simethicone (GAS-X PO) Take by mouth as needed.   Yes [provider]  Turmeric 500 MG CAPS Take by mouth.   Yes [provider]  Ubiquinol 100 MG CAPS Take 100 mg by mouth daily.    Yes [provider]    Allergies as of 06/17/2020 - Review Complete 05/04/2020  Allergen Reaction Noted  . Metoprolol succinate  [metoprolol tartrate] Other (See Comments) 12/09/2010  . Other Rash 04/24/2013  . Dexamethasone Rash 01/23/2019    Family History  Problem Relation Age of Onset  . Lung cancer Sister   . Breast cancer Sister 28  . Factor V Leiden deficiency Brother   . Lung cancer Brother    . Kidney cancer Brother   . Factor V Leiden deficiency Brother   . Factor V Leiden deficiency Brother   . Stomach cancer Brother   . Vasculitis Sister     Social History   Socioeconomic History  . Marital status: Significant Other    Spouse name: Not on file  . Number of children: Not on file  . Years of education: Not on file  . Highest education level: Not on file  Occupational History  . Not on file  Tobacco Use  . Smoking status: Former Smoker    Quit date: 1997    Years since quitting: 25.2  . Smokeless tobacco: Never Used  Vaping Use  . Vaping Use: Never used  Substance and Sexual Activity  . Alcohol use: Not on file    Comment: once a year  . Drug use: Never  . Sexual activity: Not Currently    Birth control/protection: Post-menopausal  Other Topics Concern  . Not on file  Social History Narrative  . Not on file   Social Determinants of Health   Financial Resource Strain: Not on file  Food Insecurity: Not on file  Transportation Needs: Not on file  Physical Activity: Not on file  Stress: Not on file  Social Connections: Not on file  Intimate Partner Violence: Not on file    Review of Systems: See HPI, otherwise negative ROS  Physical Exam: BP (!) 153/80   Pulse (!) 58   Temp (!) 96.4 F (35.8 C) (Temporal)   Ht 5\' 2"  (1.575 m)   Wt 72.8 kg   SpO2 97%   BMI 29.36 kg/m  General:   Alert,  pleasant and cooperative in NAD Head:  Normocephalic and atraumatic. Respiratory:  Normal work of breathing.  Impression/Plan: Bonnie Crawford is here for cataract surgery.  Risks, benefits, limitations, and alternatives regarding cataract surgery have been reviewed with the patient.  Questions have been answered.  All parties agreeable.   Benay Pillow, MD  07/28/2020, 10:15 AM

## 2020-07-28 NOTE — Transfer of Care (Signed)
Immediate Anesthesia Transfer of Care Note  Patient: Bonnie Crawford  Procedure(s) Performed: CATARACT EXTRACTION PHACO AND INTRAOCULAR LENS PLACEMENT (IOC) LEFT PANOPTIX TORIC 4.25 00:30.9 (Left Eye)  Patient Location: PACU  Anesthesia Type: MAC  Level of Consciousness: awake, alert  and patient cooperative  Airway and Oxygen Therapy: Patient Spontanous Breathing and Patient connected to supplemental oxygen  Post-op Assessment: Post-op Vital signs reviewed, Patient's Cardiovascular Status Stable, Respiratory Function Stable, Patent Airway and No signs of Nausea or vomiting  Post-op Vital Signs: Reviewed and stable  Complications: No complications documented.

## 2020-07-30 ENCOUNTER — Encounter: Payer: Self-pay | Admitting: Ophthalmology

## 2020-08-06 ENCOUNTER — Telehealth (INDEPENDENT_AMBULATORY_CARE_PROVIDER_SITE_OTHER): Payer: Self-pay | Admitting: Vascular Surgery

## 2020-08-06 NOTE — Telephone Encounter (Signed)
I will speak with Dr Delana Meyer tomorrow when he is back in the office

## 2020-08-06 NOTE — Telephone Encounter (Signed)
Patient called in wanting Dr. Delana Meyer to review her CT scans she had of her spine she had done at Dr. Hal Morales request from another provider.  She states that Dr. Delana Meyer should be please with her results and will not need an MRI unless Dr. Delana Meyer deems other wise.  Patients next appointment will be 10/27/2020 with Dr. Delana Meyer.  She had a scan of her spine 07/21/2020.  Please advise.

## 2020-08-07 ENCOUNTER — Encounter: Payer: Self-pay | Admitting: Ophthalmology

## 2020-08-08 NOTE — Telephone Encounter (Signed)
Patient was made aware that Dr Delana Meyer was not able to see the CT in the system. Patient stated that she is wearing her compression stockings and using the lymph pump. The patient will bring a copy of the results to the office at a later date for Dr Delana Meyer to review.

## 2020-08-13 ENCOUNTER — Other Ambulatory Visit: Payer: Self-pay

## 2020-08-13 ENCOUNTER — Encounter: Payer: Self-pay | Admitting: Ophthalmology

## 2020-08-14 ENCOUNTER — Other Ambulatory Visit: Payer: Medicare PPO

## 2020-08-14 ENCOUNTER — Other Ambulatory Visit
Admission: RE | Admit: 2020-08-14 | Discharge: 2020-08-14 | Disposition: A | Discharge status: Home / Self Care | Payer: Medicare PPO | Source: Ambulatory Visit | Attending: Ophthalmology | Admitting: Ophthalmology

## 2020-08-14 DIAGNOSIS — Z01812 Encounter for preprocedural laboratory examination: Secondary | ICD-10-CM | POA: Insufficient documentation

## 2020-08-14 DIAGNOSIS — Z20822 Contact with and (suspected) exposure to covid-19: Secondary | ICD-10-CM | POA: Insufficient documentation

## 2020-08-14 LAB — SARS CORONAVIRUS 2 (TAT 6-24 HRS): SARS Coronavirus 2: NEGATIVE

## 2020-08-14 NOTE — Discharge Instructions (Signed)

## 2020-08-18 ENCOUNTER — Other Ambulatory Visit: Payer: Self-pay

## 2020-08-18 ENCOUNTER — Ambulatory Visit: Payer: Medicare PPO | Admitting: Anesthesiology

## 2020-08-18 ENCOUNTER — Ambulatory Visit
Admission: RE | Admit: 2020-08-18 | Discharge: 2020-08-18 | Disposition: A | Discharge status: Home / Self Care | Payer: Medicare PPO | Attending: Ophthalmology | Admitting: Ophthalmology

## 2020-08-18 ENCOUNTER — Encounter
Admission: RE | Disposition: A | Discharge status: Home / Self Care | Payer: Self-pay | Source: Home / Self Care | Attending: Ophthalmology

## 2020-08-18 ENCOUNTER — Encounter: Payer: Self-pay | Admitting: Ophthalmology

## 2020-08-18 DIAGNOSIS — H2511 Age-related nuclear cataract, right eye: Secondary | ICD-10-CM | POA: Diagnosis present

## 2020-08-18 DIAGNOSIS — Z87891 Personal history of nicotine dependence: Secondary | ICD-10-CM | POA: Diagnosis not present

## 2020-08-18 DIAGNOSIS — Z888 Allergy status to other drugs, medicaments and biological substances status: Secondary | ICD-10-CM | POA: Diagnosis not present

## 2020-08-18 DIAGNOSIS — E785 Hyperlipidemia, unspecified: Secondary | ICD-10-CM | POA: Insufficient documentation

## 2020-08-18 DIAGNOSIS — I129 Hypertensive chronic kidney disease with stage 1 through stage 4 chronic kidney disease, or unspecified chronic kidney disease: Secondary | ICD-10-CM | POA: Insufficient documentation

## 2020-08-18 DIAGNOSIS — Z79899 Other long term (current) drug therapy: Secondary | ICD-10-CM | POA: Diagnosis not present

## 2020-08-18 DIAGNOSIS — Z7982 Long term (current) use of aspirin: Secondary | ICD-10-CM | POA: Diagnosis not present

## 2020-08-18 DIAGNOSIS — N189 Chronic kidney disease, unspecified: Secondary | ICD-10-CM | POA: Insufficient documentation

## 2020-08-18 DIAGNOSIS — Z90710 Acquired absence of both cervix and uterus: Secondary | ICD-10-CM | POA: Insufficient documentation

## 2020-08-18 HISTORY — DX: Chronic kidney disease, unspecified: N18.9

## 2020-08-18 HISTORY — DX: Unspecified disorder of nose and nasal sinuses: J34.9

## 2020-08-18 HISTORY — DX: Acute upper respiratory infection, unspecified: J06.9

## 2020-08-18 HISTORY — PX: CATARACT EXTRACTION W/PHACO: SHX586

## 2020-08-18 HISTORY — DX: Diverticulosis of intestine, part unspecified, without perforation or abscess without bleeding: K57.90

## 2020-08-18 HISTORY — DX: Personal history of urinary calculi: Z87.442

## 2020-08-18 SURGERY — PHACOEMULSIFICATION, CATARACT, WITH IOL INSERTION
Anesthesia: Monitor Anesthesia Care | Site: Eye | Laterality: Right

## 2020-08-18 MED ORDER — MIDAZOLAM HCL 2 MG/2ML IJ SOLN
INTRAMUSCULAR | Status: DC | PRN
  Administered 2020-08-18: .5 mg via INTRAVENOUS
  Administered 2020-08-18: 1.5 mg via INTRAVENOUS

## 2020-08-18 MED ORDER — TETRACAINE HCL 0.5 % OP SOLN
1.0000 [drp] | OPHTHALMIC | Status: DC | PRN
  Administered 2020-08-18 (×3): 1 [drp] via OPHTHALMIC

## 2020-08-18 MED ORDER — SODIUM HYALURONATE 10 MG/ML IO SOLN
INTRAOCULAR | Status: DC | PRN
  Administered 2020-08-18: 0.55 mL via INTRAOCULAR

## 2020-08-18 MED ORDER — SODIUM HYALURONATE 23 MG/ML IO SOLN
INTRAOCULAR | Status: DC | PRN
  Administered 2020-08-18: 0.6 mL via INTRAOCULAR

## 2020-08-18 MED ORDER — ARMC OPHTHALMIC DILATING DROPS
1.0000 "application " | OPHTHALMIC | Status: DC | PRN
  Administered 2020-08-18 (×3): 1 via OPHTHALMIC

## 2020-08-18 MED ORDER — LIDOCAINE HCL (PF) 2 % IJ SOLN
INTRAOCULAR | Status: DC | PRN
  Administered 2020-08-18: 1 mL via INTRAOCULAR

## 2020-08-18 MED ORDER — EPINEPHRINE PF 1 MG/ML IJ SOLN
INTRAOCULAR | Status: DC | PRN
  Administered 2020-08-18: 60 mL via OPHTHALMIC

## 2020-08-18 MED ORDER — FENTANYL CITRATE (PF) 100 MCG/2ML IJ SOLN
INTRAMUSCULAR | Status: DC | PRN
  Administered 2020-08-18: 50 ug via INTRAVENOUS

## 2020-08-18 MED ORDER — MOXIFLOXACIN HCL 0.5 % OP SOLN
OPHTHALMIC | Status: DC | PRN
  Administered 2020-08-18: 0.2 mL via OPHTHALMIC

## 2020-08-18 SURGICAL SUPPLY — 19 items
CANNULA ANT/CHMB 27GA (MISCELLANEOUS) ×4 IMPLANT
DISSECTOR HYDRO NUCLEUS 50X22 (MISCELLANEOUS) ×2 IMPLANT
GLOVE PI ULTRA LF STRL 7.5 (GLOVE) ×1 IMPLANT
GLOVE PI ULTRA NON LATEX 7.5 (GLOVE) ×1
GLOVE SURG SYN 8.5  E (GLOVE) ×1
GLOVE SURG SYN 8.5 E (GLOVE) ×1 IMPLANT
GOWN STRL REUS W/ TWL LRG LVL3 (GOWN DISPOSABLE) ×2 IMPLANT
GOWN STRL REUS W/TWL LRG LVL3 (GOWN DISPOSABLE) ×4
LENS IOL IQ PAN TRC 40 24.0 ×1 IMPLANT
LENS IOL PANOP TORIC 40 24.0 ×1 IMPLANT
LENS IOL PANOPTIX TORIC 24.0 ×2 IMPLANT
MARKER SKIN DUAL TIP RULER LAB (MISCELLANEOUS) ×2 IMPLANT
PACK DR. KING ARMS (PACKS) ×2 IMPLANT
PACK EYE AFTER SURG (MISCELLANEOUS) ×2 IMPLANT
PACK OPTHALMIC (MISCELLANEOUS) ×2 IMPLANT
SYR 3ML LL SCALE MARK (SYRINGE) ×2 IMPLANT
SYR TB 1ML LUER SLIP (SYRINGE) ×2 IMPLANT
WATER STERILE IRR 250ML POUR (IV SOLUTION) ×2 IMPLANT
WIPE NON LINTING 3.25X3.25 (MISCELLANEOUS) ×2 IMPLANT

## 2020-08-18 NOTE — Op Note (Signed)
OPERATIVE NOTE  Bonnie Crawford [id number] 08/18/2020   PREOPERATIVE DIAGNOSIS:  Nuclear sclerotic cataract right eye.  H25.11   POSTOPERATIVE DIAGNOSIS:    Nuclear sclerotic cataract right eye.     PROCEDURE:  Phacoemusification with posterior chamber intraocular lens placement of the right eye   LENS:   Implant Name Type Inv. Item Serial No. Manufacturer Lot No. LRB No. Used Action  LENS IOL PANOPTIX TORIC 24.0 - [id number]  LENS IOL PANOPTIX TORIC 24.0 [id number] ALCON  Right 1 Implanted       Procedure(s): CATARACT EXTRACTION PHACO AND INTRAOCULAR LENS PLACEMENT (IOC) RIGHT PANOPTIX TORIC 2.31 00:20.0 (Right)  TFNT40 +24.0   ULTRASOUND TIME: 0 minutes 20 seconds.  CDE 2.31   SURGEON:  Benay Pillow, MD, MPH  ANESTHESIOLOGIST: Anesthesiologist: Page, Adele Barthel, MD CRNA: Jeannene Patella, CRNA; Silvana Newness, CRNA   ANESTHESIA:  Topical with tetracaine drops augmented with 1% preservative-free intracameral lidocaine.  ESTIMATED BLOOD LOSS: less than 1 mL.   COMPLICATIONS:  None.   DESCRIPTION OF PROCEDURE:  The patient was identified in the holding room and transported to the operating room and placed in the supine position under the operating microscope.  The right eye was identified as the operative eye and it was prepped and draped in the usual sterile ophthalmic fashion.  The verion was registered without difficulty.   A 1.0 millimeter clear-corneal paracentesis was made at the 10:30 position. 0.5 ml of preservative-free 1% lidocaine with epinephrine was injected into the anterior chamber.  The anterior chamber was filled with Healon 5 viscoelastic.  A 2.4 millimeter keratome was used to make a near-clear corneal incision at the 8:00 position.  A curvilinear capsulorrhexis was made with a cystotome and capsulorrhexis forceps.  Balanced salt solution was used to hydrodissect and hydrodelineate the nucleus.   Phacoemulsification was then used in stop and chop fashion  to remove the lens nucleus and epinucleus.  The remaining cortex was then removed using the irrigation and aspiration handpiece. Healon was then placed into the capsular bag to distend it for lens placement.  A lens was then injected into the capsular bag.  The remaining viscoelastic was aspirated.  The lens was rotated to 180 degrees with alignment assistance from the [place].   Wounds were hydrated with balanced salt solution.  The anterior chamber was inflated to a physiologic pressure with balanced salt solution.   Intracameral vigamox 0.1 mL undiluted was injected into the eye and a drop placed onto the ocular surface.  The lens was well centered.  No wound leaks were noted.  The patient was taken to the recovery room in stable condition without complications of anesthesia or surgery  Benay Pillow 08/18/2020, 11:21 AM

## 2020-08-18 NOTE — H&P (Signed)
[facility]   Primary Care Physician:  Idelle Crouch, MD Ophthalmologist: Dr. Benay Pillow  Pre-Procedure History & Physical: HPI:  Bonnie Crawford is a 80 y.o. female here for cataract surgery.   Past Medical History:  Diagnosis Date  . Anxiety   . Arthritis   . Asthma    childhood  . Chronic kidney disease    Renal Insufficiency  . Colon polyp   . Diverticulosis   . Factor V Leiden ([place])   . GERD (gastroesophageal reflux disease)   . Herpes zoster   . History of kidney stones   . Hyperlipidemia   . Hypertension   . Inflammatory bowel disease   . MGUS (monoclonal gammopathy of unknown significance)   . Osteoporosis   . PAT (paroxysmal atrial tachycardia) ([place])   . Sinus trouble   . URI (upper respiratory infection)    2 months ago    Past Surgical History:  Procedure Laterality Date  . ABDOMINAL HYSTERECTOMY    . APPENDECTOMY    . BLEPHAROPLASTY Bilateral   . BREAST CYST ASPIRATION Left    neg  . CARDIAC CATHETERIZATION    . CATARACT EXTRACTION W/PHACO Left 07/28/2020   Procedure: CATARACT EXTRACTION PHACO AND INTRAOCULAR LENS PLACEMENT (IOC) LEFT PANOPTIX TORIC 4.25 00:30.9;  Surgeon: Eulogio Bear, MD;  Location: [facility];  Service: Ophthalmology;  Laterality: Left;  . COLONOSCOPY    . KNEE SURGERY Left    2 meniscus tears  . NECK SURGERY     cosmetic    Prior to Admission medications   Medication Sig Start Date End Date Taking? Authorizing Provider  acetaminophen (TYLENOL) 500 MG tablet Take 500 mg by mouth every 6 (six) hours as needed.    Yes [provider]  aspirin EC 81 MG tablet Take 81 mg by mouth daily.   Yes [provider]  atenolol (TENORMIN) 25 MG tablet Take by mouth. 11/27/19  Yes [provider]  atorvastatin (LIPITOR) 20 MG tablet Take 20 mg by mouth daily.   Yes [provider]  Calcium Citrate-Vitamin D 315-250 MG-UNIT TABS Take by mouth.   Yes [provider]   carbamide peroxide (DEBROX) 6.5 % OTIC solution 5 drops as needed.   Yes [provider]  carboxymethylcellulose (REFRESH PLUS) 0.5 % SOLN 1 drop 2 (two) times daily as needed.   Yes [provider]  Cholecalciferol (VITAMIN D-1000 MAX ST) 25 MCG (1000 UT) tablet Take by mouth.   Yes [provider]  COLLAGEN PO Take by mouth daily.   Yes [provider]  conjugated estrogens (PREMARIN) vaginal cream 0.5g vaginally twice weekly 02/09/16  Yes [provider]  dextromethorphan-guaiFENesin (MUCINEX DM) 30-600 MG 12hr tablet Take 1 tablet by mouth 2 (two) times daily.   Yes [provider]  diphenhydrAMINE (BENADRYL) 25 MG tablet Take 25 mg by mouth every 6 (six) hours as needed.    Yes [provider]  hydrochlorothiazide (HYDRODIURIL) 25 MG tablet 3 (three) times a week. M, W, F 04/23/20  Yes [provider]  Magnesium 500 MG TABS Take by mouth 3 (three) times a week.   Yes [provider]  Multiple Vitamin (MULTIVITAMIN) tablet Take by mouth.   Yes [provider]  Nutritional Supplements (KETO PO) Take by mouth in the morning and at bedtime. Keto Strong and Keto Strong Detox   Yes [provider]  Omega-3 Fatty Acids (OMEGA-3 PLUS PO) Take by mouth 3 (three) times a  week.   Yes [provider]  omeprazole (PRILOSEC) 20 MG capsule Take 20 mg by mouth daily as needed.  11/07/18  Yes [provider]  psyllium (METAMUCIL) 58.6 % powder Take 1 packet by mouth daily as needed.    Yes [provider]  Simethicone (GAS-X PO) Take by mouth as needed.   Yes [provider]  Turmeric 500 MG CAPS Take by mouth.   Yes [provider]  Ubiquinol 100 MG CAPS Take 100 mg by mouth daily.    Yes [provider]  Lifitegrast Shirley Friar) 5 % SOLN Apply to eye.    [provider]  mirabegron ER (MYRBETRIQ) 50 MG TB24 tablet Take 1 tablet (50 mg total) by mouth  daily. Patient taking differently: Take 50 mg by mouth daily. Cain Saupe, Sat 04/06/19   Billey Co, MD  neomycin-polymyxin-hydrocortisone (CORTISPORIN) OTIC solution Place 4 drops into both ears at bedtime.    [provider]  predniSONE (STERAPRED UNI-PAK 21 TAB) 10 MG (21) TBPK tablet Take 6 tablets on the first day and decrease by 1 tablet each day until finished. 01/21/19   Sherrie George B, FNP    Allergies as of 07/31/2020 - Review Complete 07/28/2020  Allergen Reaction Noted  . Metoprolol succinate  [metoprolol tartrate] Other (See Comments) 12/09/2010  . Other Rash 04/24/2013  . Dexamethasone Rash 01/23/2019    Family History  Problem Relation Age of Onset  . Lung cancer Sister   . Breast cancer Sister 29  . Factor V Leiden deficiency Brother   . Lung cancer Brother   . Kidney cancer Brother   . Factor V Leiden deficiency Brother   . Factor V Leiden deficiency Brother   . Stomach cancer Brother   . Vasculitis Sister     Social History   Socioeconomic History  . Marital status: Significant Other    Spouse name: Not on file  . Number of children: Not on file  . Years of education: Not on file  . Highest education level: Not on file  Occupational History  . Not on file  Tobacco Use  . Smoking status: Former Smoker    Quit date: 1997    Years since quitting: 25.2  . Smokeless tobacco: Never Used  Vaping Use  . Vaping Use: Never used  Substance and Sexual Activity  . Alcohol use: Not on file    Comment: once a year  . Drug use: Never  . Sexual activity: Not Currently    Birth control/protection: Post-menopausal  Other Topics Concern  . Not on file  Social History Narrative  . Not on file   Social Determinants of Health   Financial Resource Strain: Not on file  Food Insecurity: Not on file  Transportation Needs: Not on file  Physical Activity: Not on file  Stress: Not on file  Social Connections: Not on file  Intimate Partner Violence:  Not on file    Review of Systems: See HPI, otherwise negative ROS  Physical Exam: BP (!) 152/68   Pulse (!) 50   Temp (!) 97.3 F (36.3 C) (Temporal)   Ht 5\' 2"  (1.575 m)   Wt 73.5 kg   SpO2 98%   BMI 29.63 kg/m  General:   Alert,  pleasant and cooperative in NAD Head:  Normocephalic and atraumatic. Respiratory:  Normal work of breathing. Cardiovascular:  RRR  Impression/Plan: Bonnie Crawford is here for cataract surgery.  Risks, benefits, limitations, and alternatives regarding cataract surgery have  been reviewed with the patient.  Questions have been answered.  All parties agreeable.   Benay Pillow, MD  08/18/2020, 10:50 AM

## 2020-08-18 NOTE — Transfer of Care (Signed)
Immediate Anesthesia Transfer of Care Note  Patient: Bonnie Crawford  Procedure(s) Performed: CATARACT EXTRACTION PHACO AND INTRAOCULAR LENS PLACEMENT (IOC) RIGHT PANOPTIX TORIC 2.31 00:20.0 (Right Eye)  Patient Location: PACU  Anesthesia Type: MAC  Level of Consciousness: awake, alert  and patient cooperative  Airway and Oxygen Therapy: Patient Spontanous Breathing and Patient connected to supplemental oxygen  Post-op Assessment: Post-op Vital signs reviewed, Patient's Cardiovascular Status Stable, Respiratory Function Stable, Patent Airway and No signs of Nausea or vomiting  Post-op Vital Signs: Reviewed and stable  Complications: No complications documented.

## 2020-08-18 NOTE — Anesthesia Procedure Notes (Signed)
Procedure Name: MAC Date/Time: 08/18/2020 11:02 AM Performed by: Silvana Newness, CRNA Pre-anesthesia Checklist: Patient identified, Emergency Drugs available, Suction available, Patient being monitored and Timeout performed Patient Re-evaluated:Patient Re-evaluated prior to induction Oxygen Delivery Method: Nasal cannula Preoxygenation: Pre-oxygenation with 100% oxygen

## 2020-08-18 NOTE — Anesthesia Preprocedure Evaluation (Signed)
Anesthesia Evaluation  Patient identified by MRN, date of birth, ID band Patient awake    Reviewed: Allergy & Precautions, NPO status   History of Anesthesia Complications Negative for: history of anesthetic complications  Airway Mallampati: I  TM Distance: >3 FB Neck ROM: Full    Dental no notable dental hx.    Pulmonary former smoker,  Lingering cough from PND related to resolving URI   breath sounds clear to auscultation       Cardiovascular Exercise Tolerance: Good hypertension, Pt. on home beta blockers and Pt. on medications + dysrhythmias (paroxysmal atrial tachycardia)  Rhythm:Regular Rate:Normal  HLD   Neuro/Psych Anxiety negative neurological ROS     GI/Hepatic GERD  ,Inflammatory bowel disease   Endo/Other    Renal/GU Renal disease (stage 3 CKD)     Musculoskeletal  (+) Arthritis , Osteoporosis   Abdominal   Peds  Hematology MGUS Factor V Leiden   Anesthesia Other Findings   Reproductive/Obstetrics                            Anesthesia Physical  Anesthesia Plan  ASA: III  Anesthesia Plan: MAC   Post-op Pain Management:    Induction: Intravenous  PONV Risk Score and Plan: 2 and TIVA and Treatment may vary due to age or medical condition  Airway Management Planned: Natural Airway and Nasal Cannula  Additional Equipment:   Intra-op Plan:   Post-operative Plan:   Informed Consent: I have reviewed the patients History and Physical, chart, labs and discussed the procedure including the risks, benefits and alternatives for the proposed anesthesia with the patient or authorized representative who has indicated his/her understanding and acceptance.       Plan Discussed with: CRNA  Anesthesia Plan Comments:        Anesthesia Quick Evaluation

## 2020-08-18 NOTE — Anesthesia Postprocedure Evaluation (Signed)
Anesthesia Post Note  Patient: Samayra Hebel  Procedure(s) Performed: CATARACT EXTRACTION PHACO AND INTRAOCULAR LENS PLACEMENT (IOC) RIGHT PANOPTIX TORIC 2.31 00:20.0 (Right Eye)     Patient location during evaluation: PACU Anesthesia Type: MAC Level of consciousness: awake and alert Pain management: pain level controlled Vital Signs Assessment: post-procedure vital signs reviewed and stable Respiratory status: spontaneous breathing Cardiovascular status: blood pressure returned to baseline Postop Assessment: no apparent nausea or vomiting, adequate PO intake and no headache Anesthetic complications: no   No complications documented.  Adele Barthel Kyrstal Monterrosa

## 2020-08-19 ENCOUNTER — Encounter: Payer: Self-pay | Admitting: Ophthalmology

## 2020-10-27 ENCOUNTER — Ambulatory Visit (INDEPENDENT_AMBULATORY_CARE_PROVIDER_SITE_OTHER): Payer: Medicare PPO | Admitting: Vascular Surgery

## 2020-10-30 ENCOUNTER — Other Ambulatory Visit: Payer: Self-pay | Admitting: Internal Medicine

## 2020-10-31 ENCOUNTER — Other Ambulatory Visit: Payer: Self-pay | Admitting: Internal Medicine

## 2020-10-31 DIAGNOSIS — N644 Mastodynia: Secondary | ICD-10-CM

## 2020-10-31 DIAGNOSIS — Z1231 Encounter for screening mammogram for malignant neoplasm of breast: Secondary | ICD-10-CM

## 2020-11-03 ENCOUNTER — Other Ambulatory Visit: Payer: Self-pay | Admitting: Internal Medicine

## 2020-11-03 DIAGNOSIS — N644 Mastodynia: Secondary | ICD-10-CM

## 2020-12-01 ENCOUNTER — Ambulatory Visit
Admission: RE | Admit: 2020-12-01 | Discharge: 2020-12-01 | Disposition: A | Discharge status: Home / Self Care | Payer: Medicare PPO | Source: Ambulatory Visit | Attending: Internal Medicine | Admitting: Internal Medicine

## 2020-12-01 ENCOUNTER — Other Ambulatory Visit: Payer: Self-pay

## 2020-12-01 DIAGNOSIS — N644 Mastodynia: Secondary | ICD-10-CM | POA: Insufficient documentation

## 2020-12-01 IMAGING — MG DIGITAL DIAGNOSTIC BILAT W/ TOMO W/ CAD
8 series · 8 of 24 positions shown · non-contrast
Comparison: Previous exam(s).

CLINICAL DATA: Nonfocal pain in the upper inner left breast for the
past year and nonfocal pain throughout the outer right breast for
the past 6 months. Sister diagnosed with breast cancer at age 70.

EXAM:
DIGITAL DIAGNOSTIC BILATERAL MAMMOGRAM WITH TOMOSYNTHESIS AND CAD
TECHNIQUE: Bilateral digital diagnostic mammography and breast tomosynthesis
was performed. The images were evaluated with computer-aided
detection.

[R CC synth-2D]
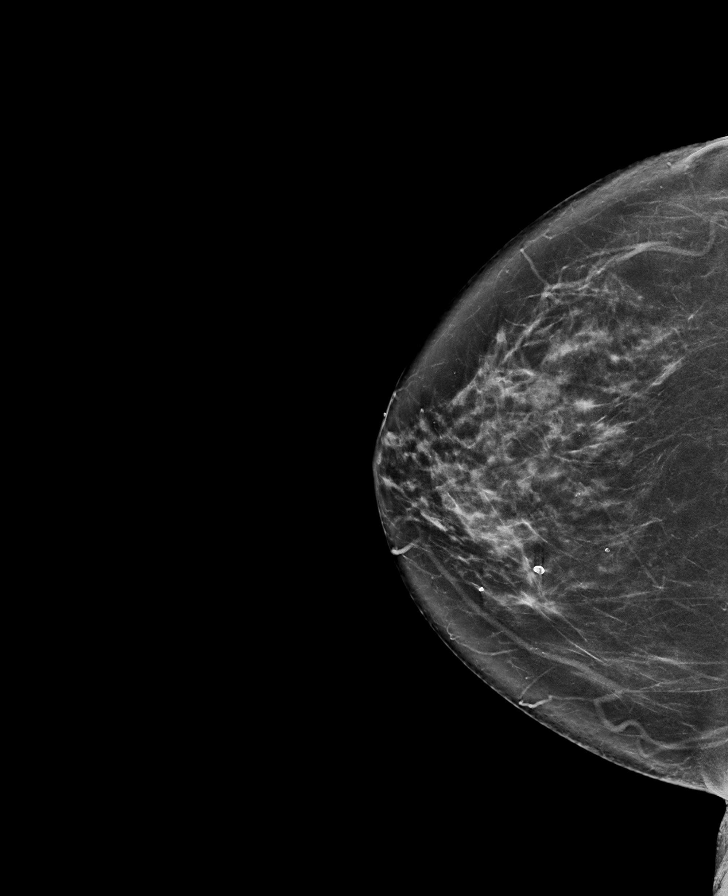

[R MLO synth-2D]
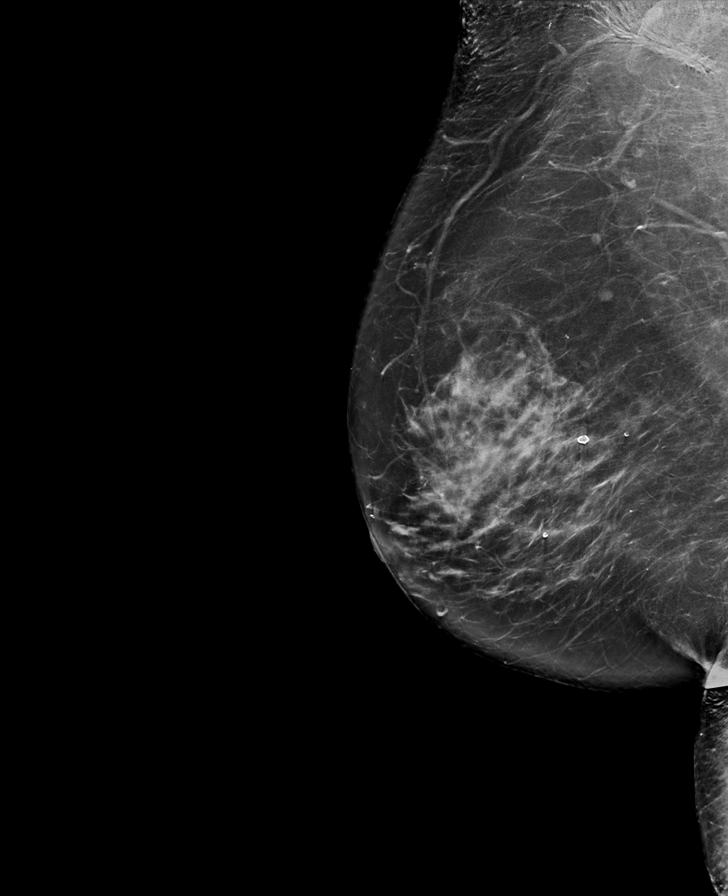

[L MLO synth-2D]
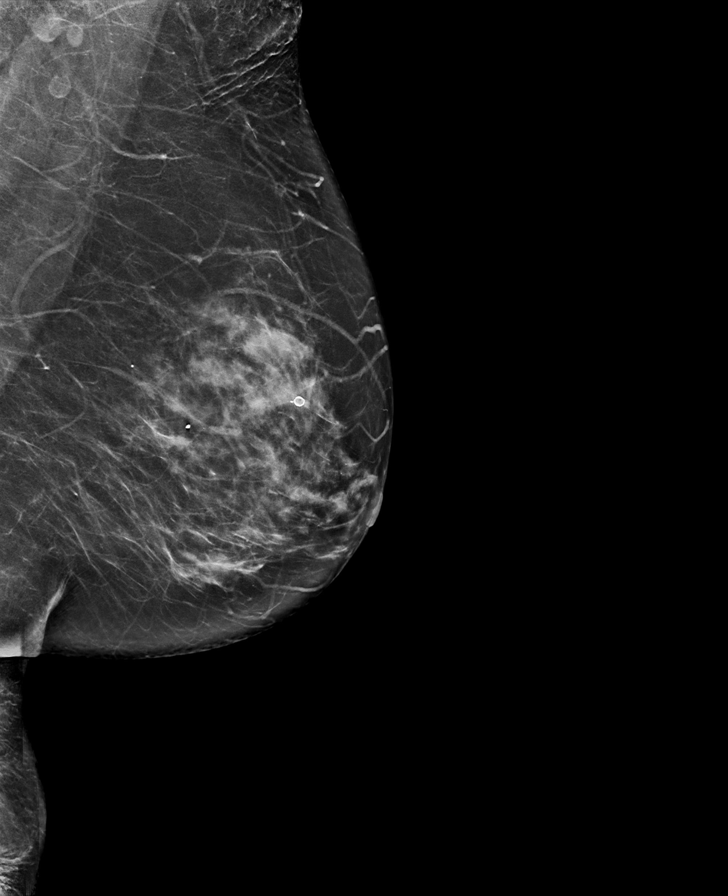

[L CC synth-2D]
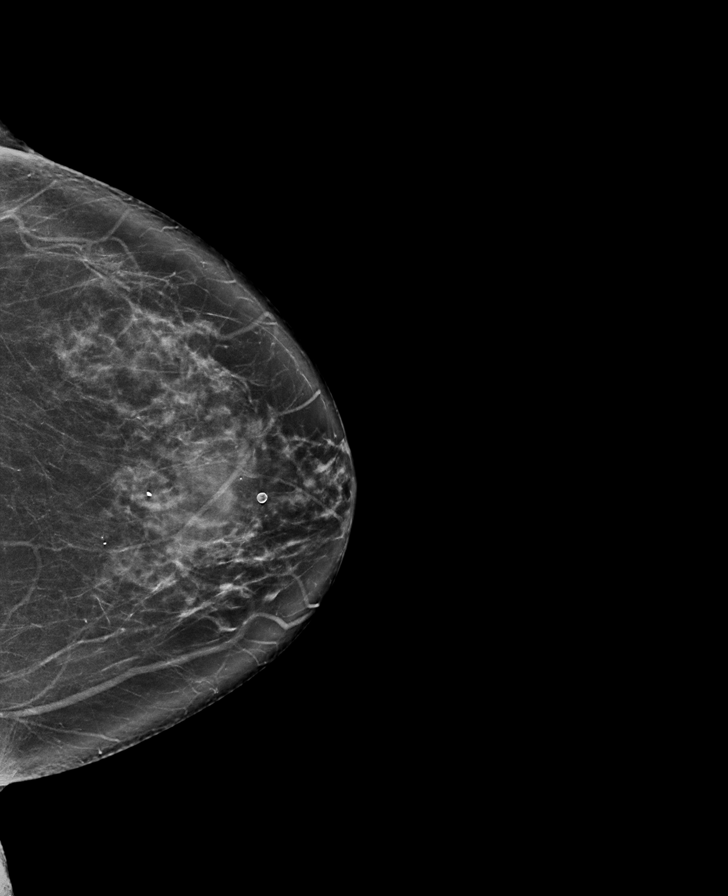

[R CC tomo · tomo slice 39/78.0]
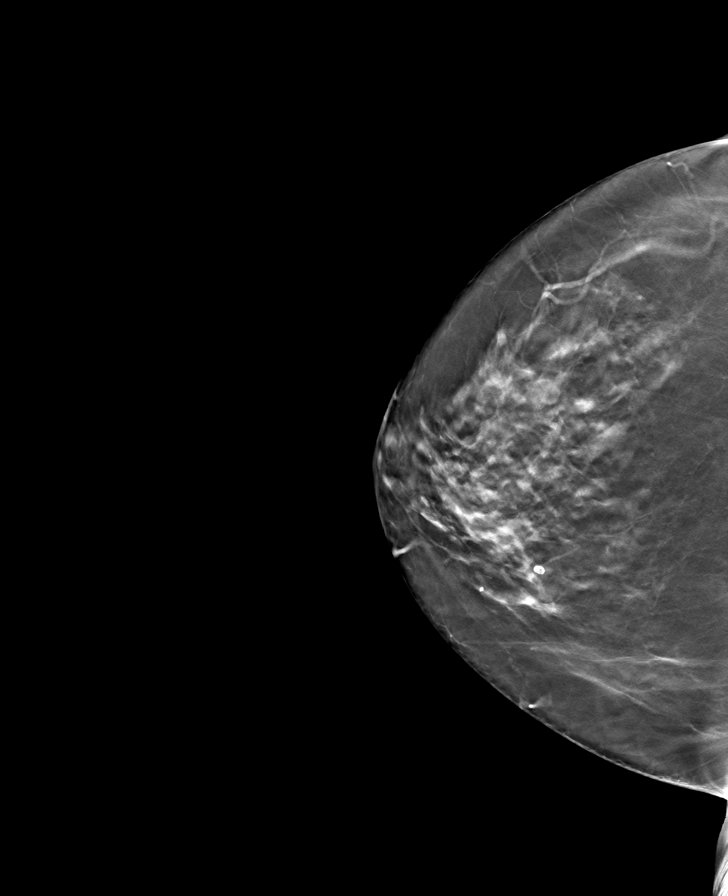

[L MLO tomo · tomo slice 39/77.0]
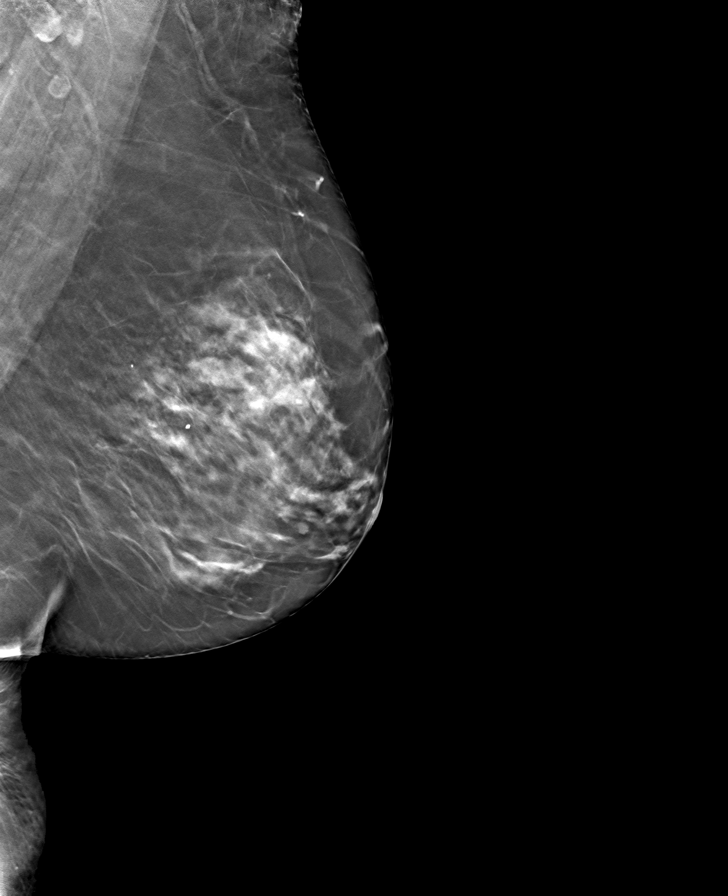

[R MLO tomo · tomo slice 43/84.0]
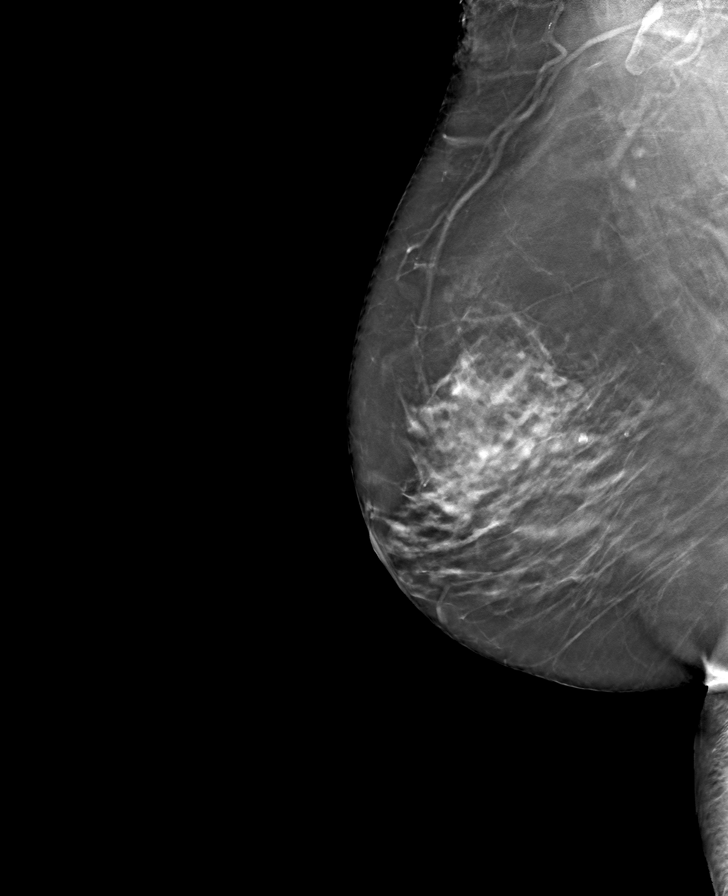

[L CC tomo · tomo slice 40/79.0]
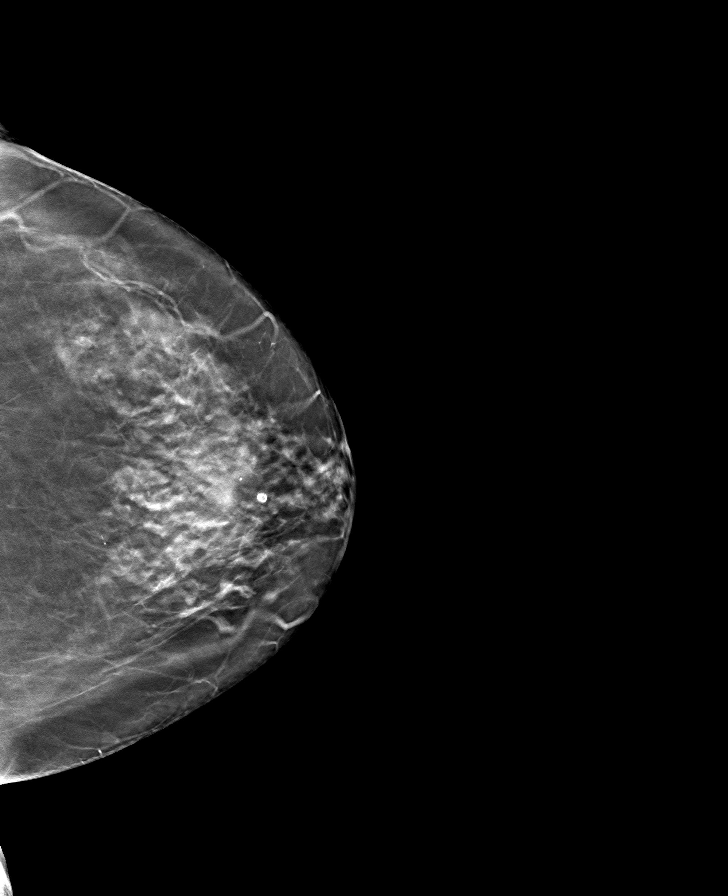

[8 of 24 positions shown; findings below may reference images not displayed]

ACR Breast Density Category c: The breast tissue is heterogeneously
dense, which may obscure small masses.
FINDINGS: Stable mammographic appearance of the breasts with no interval
findings suspicious for malignancy in either breast.
IMPRESSION: No evidence of malignancy.

RECOMMENDATION:
Bilateral screening mammogram in 1 year.

I have discussed the findings and recommendations with the patient.
If applicable, a reminder letter will be sent to the patient
regarding the next appointment.

BI-RADS CATEGORY  1: Negative.

## 2021-04-06 ENCOUNTER — Other Ambulatory Visit: Payer: Self-pay | Admitting: Internal Medicine

## 2021-04-06 DIAGNOSIS — I890 Lymphedema, not elsewhere classified: Secondary | ICD-10-CM

## 2021-04-06 DIAGNOSIS — R29898 Other symptoms and signs involving the musculoskeletal system: Secondary | ICD-10-CM

## 2021-04-06 DIAGNOSIS — M542 Cervicalgia: Secondary | ICD-10-CM

## 2021-04-23 ENCOUNTER — Ambulatory Visit: Payer: Medicare PPO

## 2021-04-23 ENCOUNTER — Ambulatory Visit
Admission: RE | Admit: 2021-04-23 | Discharge: 2021-04-23 | Disposition: A | Discharge status: Home / Self Care | Payer: Medicare PPO | Source: Ambulatory Visit | Attending: Internal Medicine | Admitting: Internal Medicine

## 2021-04-23 ENCOUNTER — Other Ambulatory Visit: Payer: Self-pay

## 2021-04-23 DIAGNOSIS — I890 Lymphedema, not elsewhere classified: Secondary | ICD-10-CM | POA: Insufficient documentation

## 2021-04-23 DIAGNOSIS — M542 Cervicalgia: Secondary | ICD-10-CM | POA: Diagnosis present

## 2021-04-23 DIAGNOSIS — R29898 Other symptoms and signs involving the musculoskeletal system: Secondary | ICD-10-CM | POA: Insufficient documentation

## 2021-04-23 IMAGING — MR MR CERVICAL SPINE W/O CM
5 series · 38 of 48 positions shown · non-contrast
Comparison: None.

CLINICAL DATA: Neck pain

EXAM:
MRI CERVICAL SPINE WITHOUT CONTRAST
TECHNIQUE: Multiplanar, multisequence MR imaging of the cervical spine was
performed. No intravenous contrast was administered.

[Series 5: T2 · sagittal · 3.0mm · 0.62mm/px · 8 of 15 slices shown (1 of 2)]
[im 1/15]
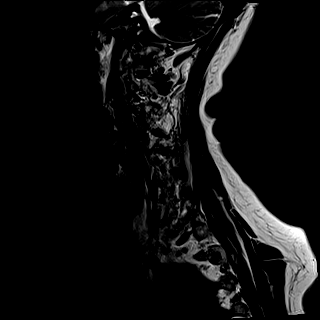
[im 3/15]
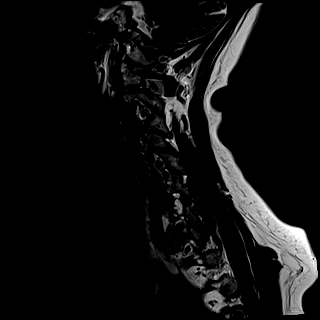
[im 5/15]
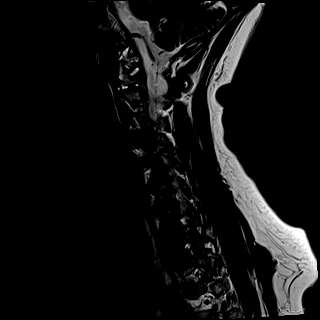
[im 7/15]
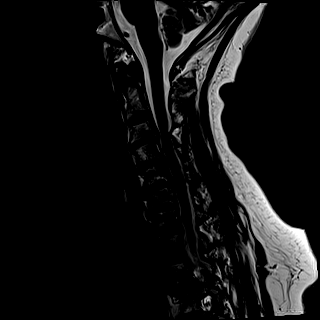
[im 9/15]
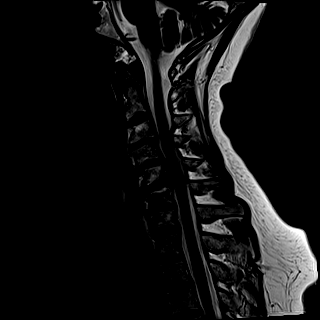
[im 11/15]
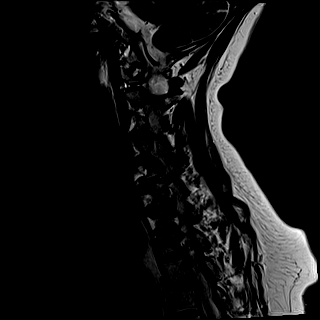
[im 13/15]
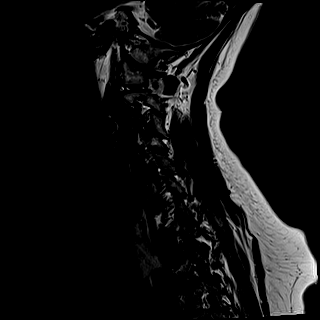
[im 15/15]
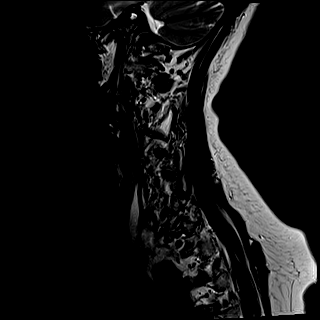

[Series 6: FLAIR · sagittal · 3.0mm · 0.78mm/px · 7 of 15 slices shown]
[im 1/15]
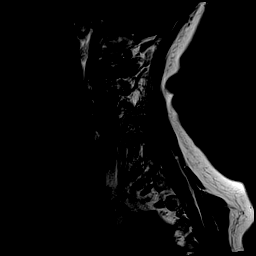
[im 3/15]
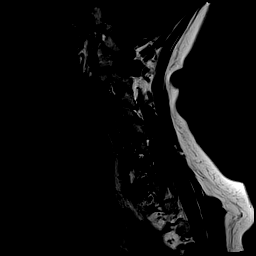
[im 5/15]
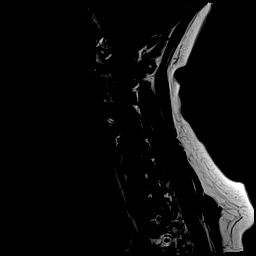
[im 8/15]
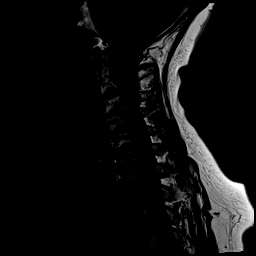
[im 10/15]
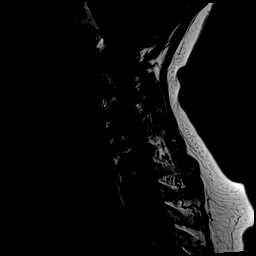
[im 12/15]
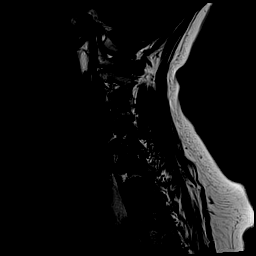
[im 15/15]
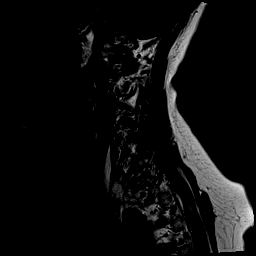

[Series 7: STIR · sagittal · 3.0mm · 0.62mm/px · 7 of 15 slices shown]
[im 1/15]
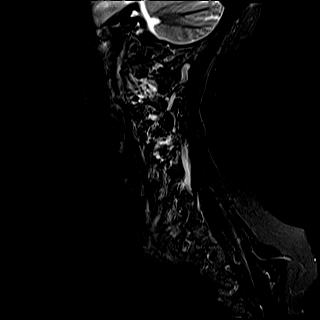
[im 3/15]
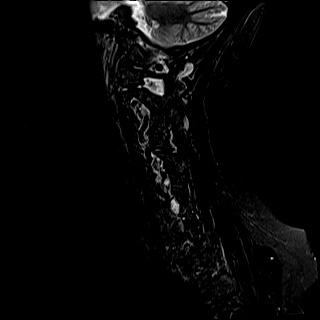
[im 5/15]
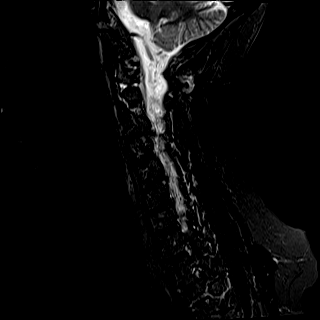
[im 8/15]
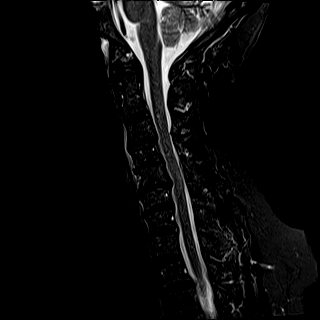
[im 10/15]
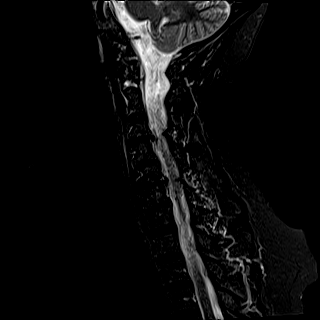
[im 12/15]
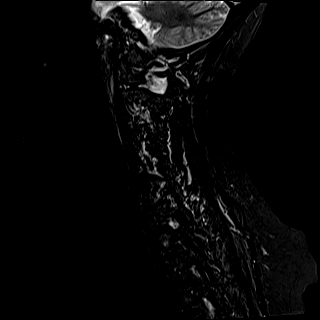
[im 15/15]
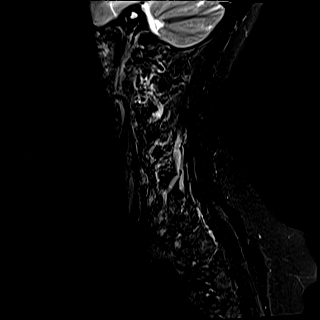

[Series 8: T2 · axial · 3.0mm · 0.70mm/px · z∈[-119,-30]mm · 9 of 26 slices shown (2 of 2)]
[im 1/26]
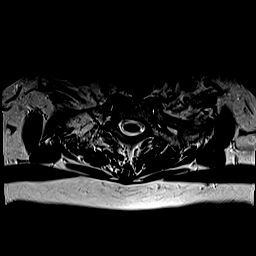
[im 5/26]
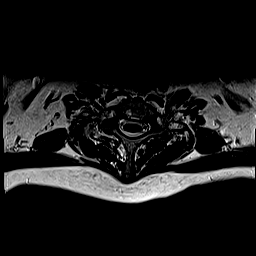
[im 9/26]
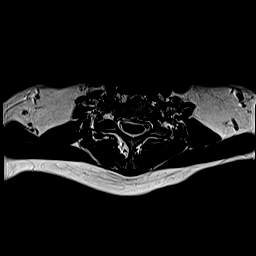
[im 11/26]
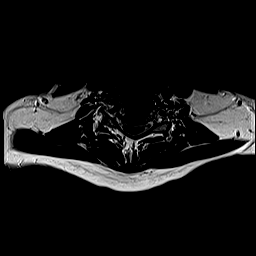
[im 13/26]
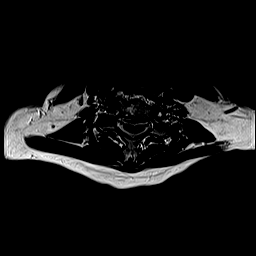
[im 15/26]
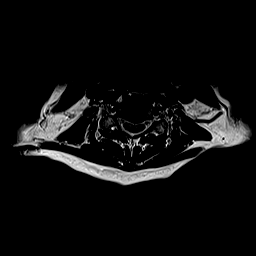
[im 17/26]
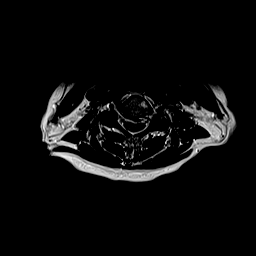
[im 21/26]
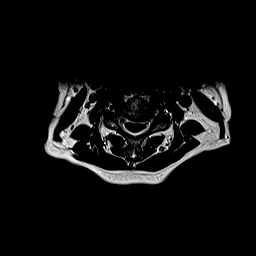
[im 26/26]
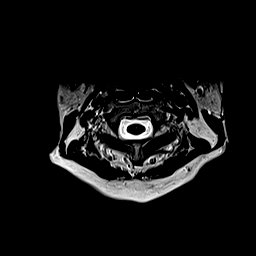

[Series 9: ax mpgr · axial · 3.0mm · 0.35mm/px · z∈[-119,-47]mm · 7 of 27 slices shown]
[im 1/27]
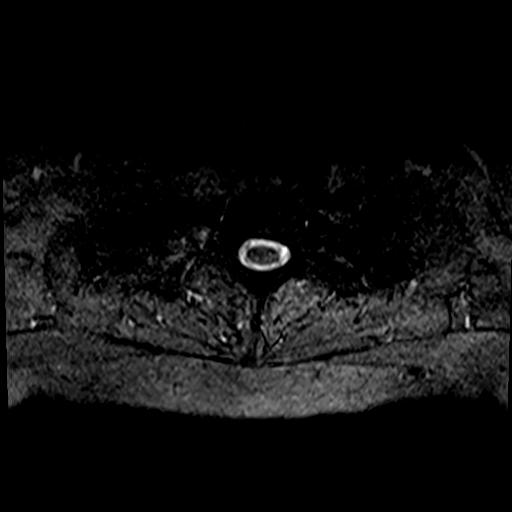
[im 5/27]
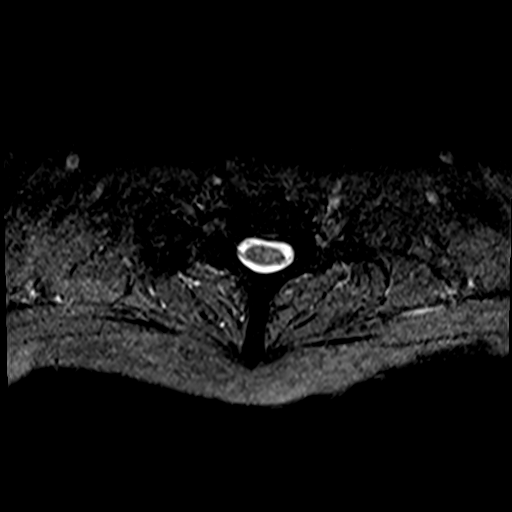
[im 9/27]
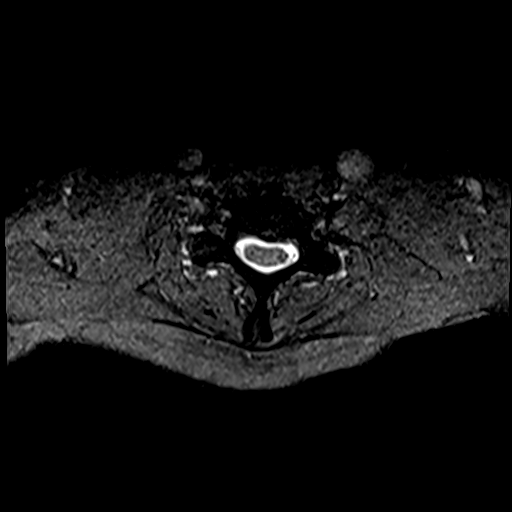
[im 11/27]
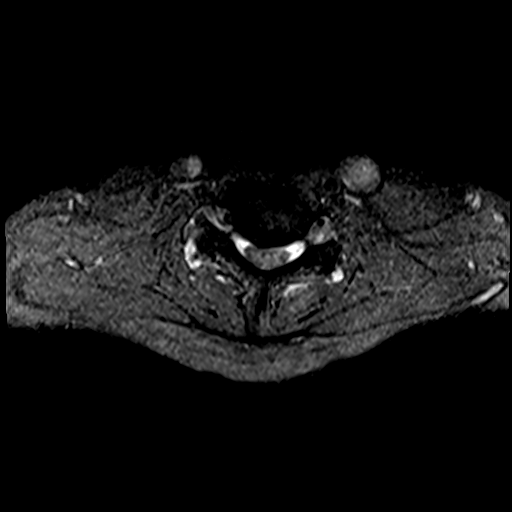
[im 16/27]
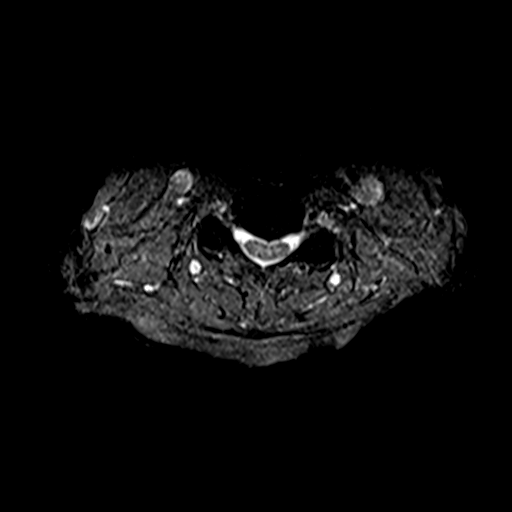
[im 18/27]
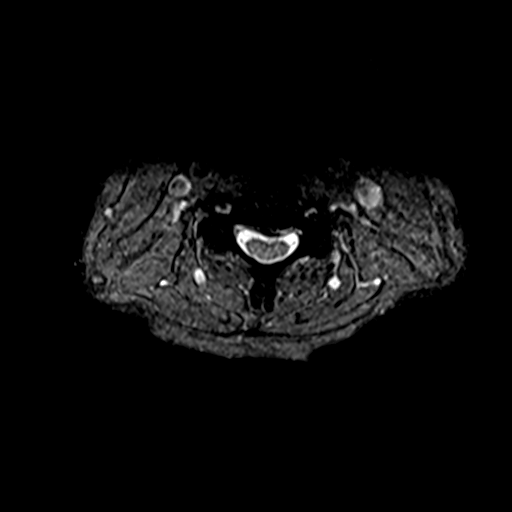
[im 22/27]
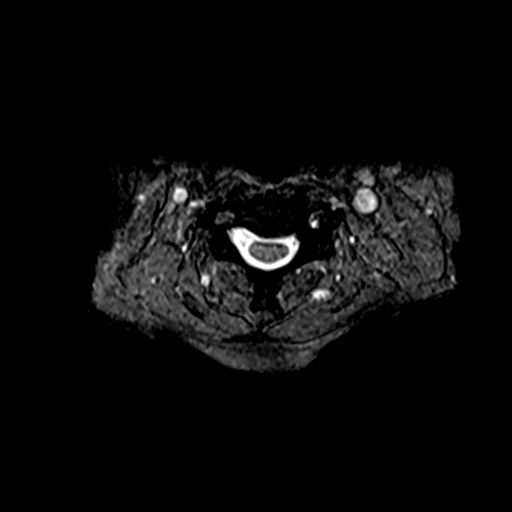

[38 of 48 positions shown; findings below may reference images not displayed]

FINDINGS: Alignment: Straightening of the normal cervical lordosis.

Vertebrae: No regional fracture. T1 and T2 dark focus anteriorly
within the T2 vertebral body measuring 5 mm in size, likely a benign
sclerotic focus.

Cord: No primary cord lesion.  See below regarding stenosis.

Posterior Fossa, vertebral arteries, paraspinal tissues: Negative

Disc levels:

Foramen magnum is widely patent. Ordinary mild osteoarthritis at the
C1-2 articulation without encroachment upon the neural spaces.

C2-3: Normal interspace.

C3-4: Chronic disc degeneration with endplate osteophytes and
bulging of the disc more prominent towards the left. Mild facet
hypertrophy. Narrowing of the spinal canal, with AP diameter in the
midline measuring 7.7 mm. No cord compression. Moderate bilateral
foraminal narrowing.

C4-5: Endplate osteophytes and bulging of the disc. No canal or
foraminal stenosis.

C5-6: Endplate osteophytes and bulging of the disc. Narrowing of the
canal with effacement of the subarachnoid space. AP diameter in the
midline 7 mm. Slight cord indentation. Foraminal narrowing on the
right.

C6-7: Minimal disc bulge.  No stenosis.

C7-T1: Minimal disc bulge.  No stenosis.
IMPRESSION: C3-4: Chronic spondylosis. Canal narrowing with AP diameter of
mm. No cord compression. Moderate bilateral foraminal narrowing.

C5-6: Chronic spondylosis. Canal narrowing with AP diameter of 7 mm.
Mild cord deformity but no cord edema. Foraminal narrowing on the
right.

## 2021-04-23 IMAGING — MR MR LUMBAR SPINE W/O CM
5 series · 30 of 48 positions shown · non-contrast
Comparison: None.

CLINICAL DATA: Weakness of both lower extremities.

EXAM:
MRI LUMBAR SPINE WITHOUT CONTRAST
TECHNIQUE: Multiplanar, multisequence MR imaging of the lumbar spine was
performed. No intravenous contrast was administered.

[Series 5: T2 · sagittal · 4.0mm · 0.81mm/px · 6 of 17 slices shown (1 of 2)]
[im 1/17]
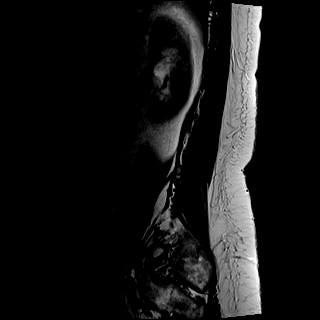
[im 4/17]
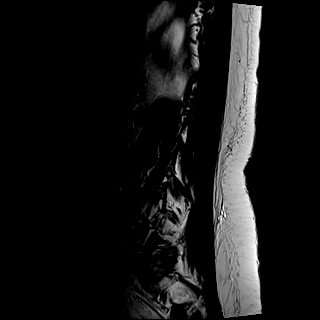
[im 7/17]
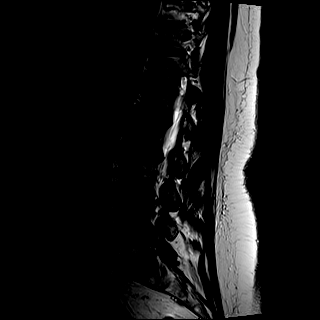
[im 10/17]
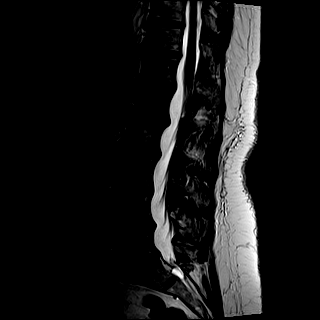
[im 13/17]
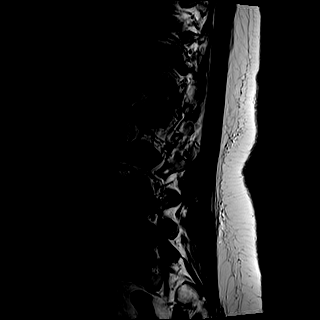
[im 17/17]
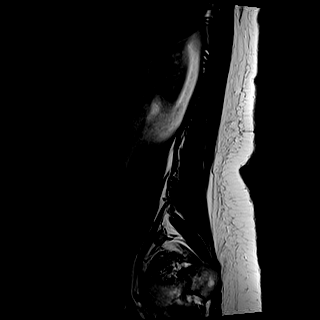

[Series 6: T1 · sagittal · 4.0mm · 0.81mm/px · 7 of 17 slices shown (1 of 2)]
[im 1/17]
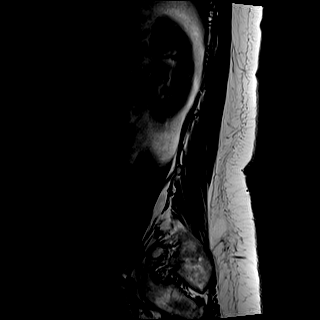
[im 3/17]
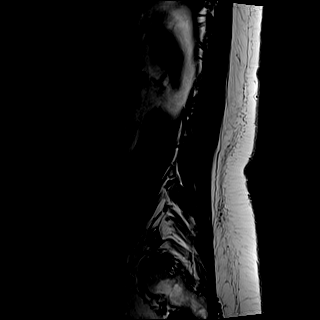
[im 6/17]
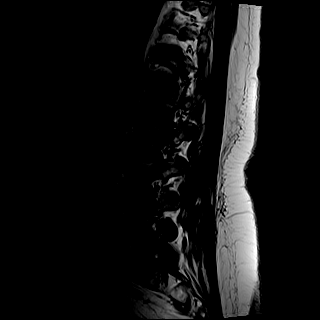
[im 9/17]
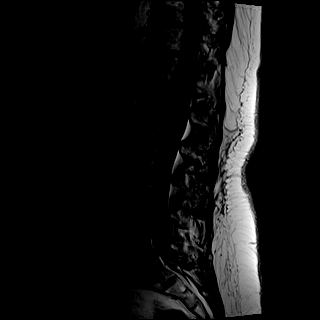
[im 11/17]
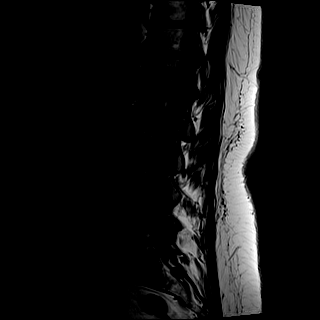
[im 14/17]
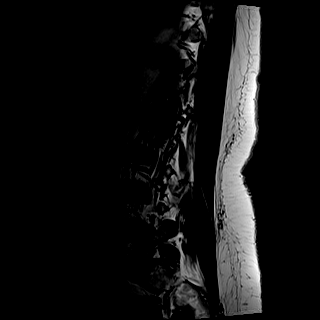
[im 17/17]
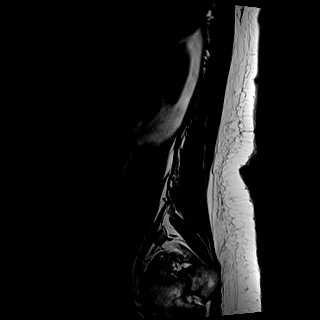

[Series 7: STIR · sagittal · 4.0mm · 0.41mm/px · 1 of 17 slices shown]
[im 1/17]
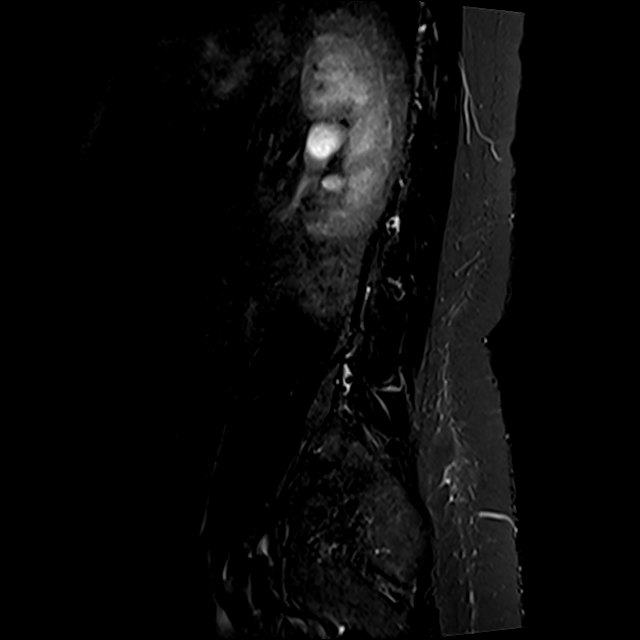

[Series 8: T2 · axial · 4.0mm · 0.78mm/px · z∈[-205,-11]mm · 8 of 34 slices shown (2 of 2)]
[im 1/34]
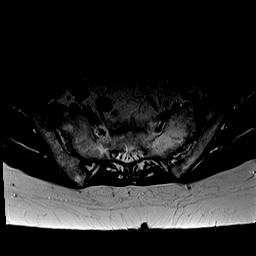
[im 6/34]
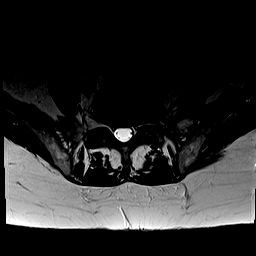
[im 11/34]
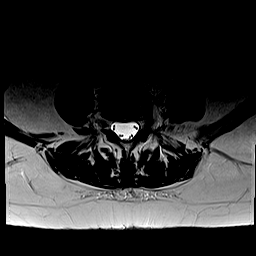
[im 16/34]
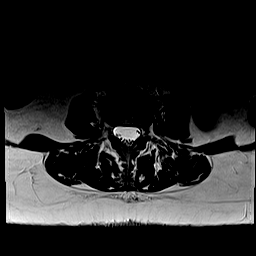
[im 18/34]
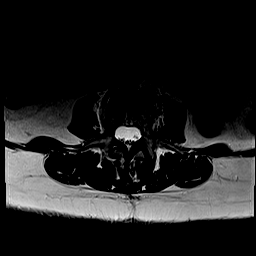
[im 23/34]
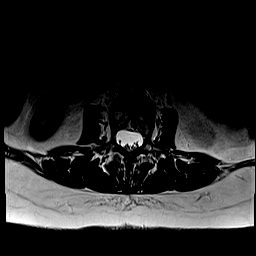
[im 28/34]
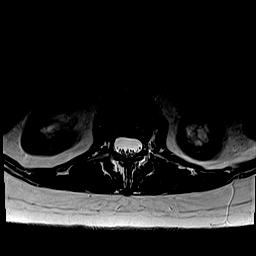
[im 34/34]
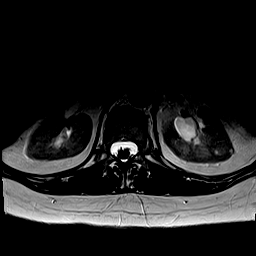

[Series 9: T1 · axial · 4.0mm · 0.39mm/px · z∈[-205,-11]mm · 8 of 34 slices shown (2 of 2)]
[im 1/34]
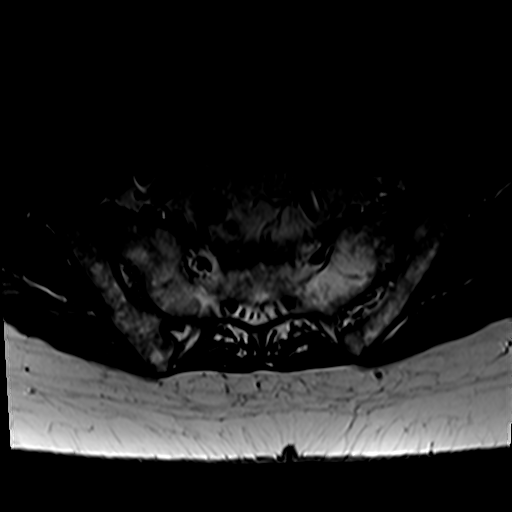
[im 6/34]
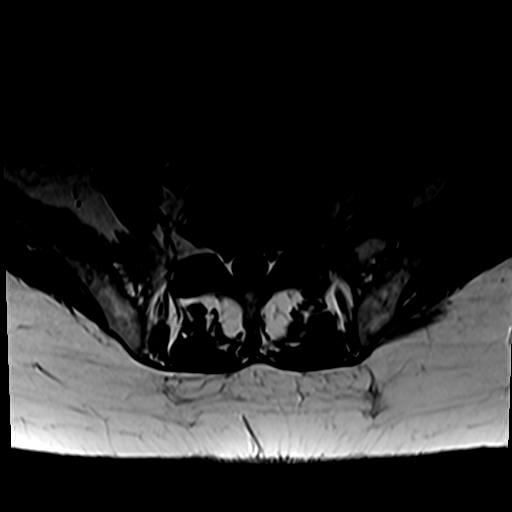
[im 11/34]
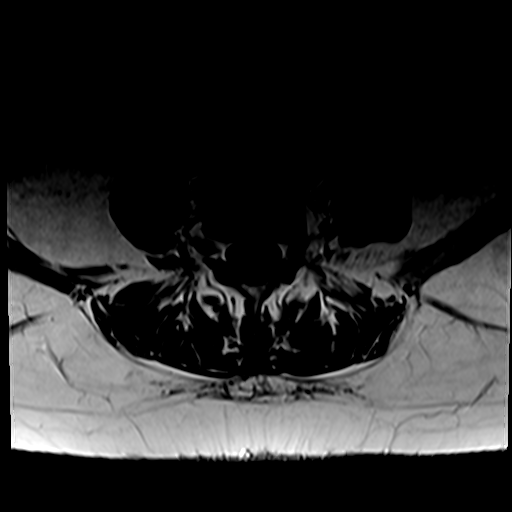
[im 16/34]
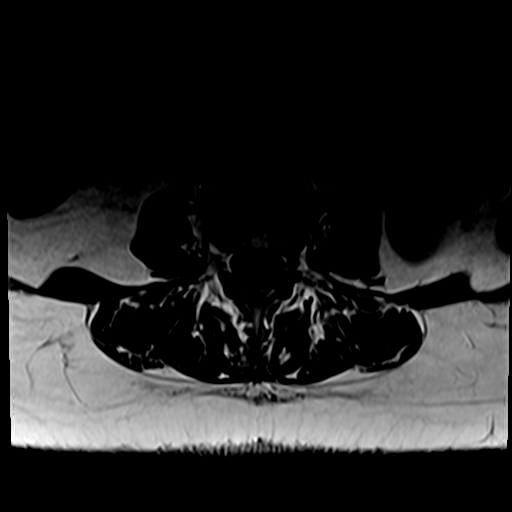
[im 18/34]
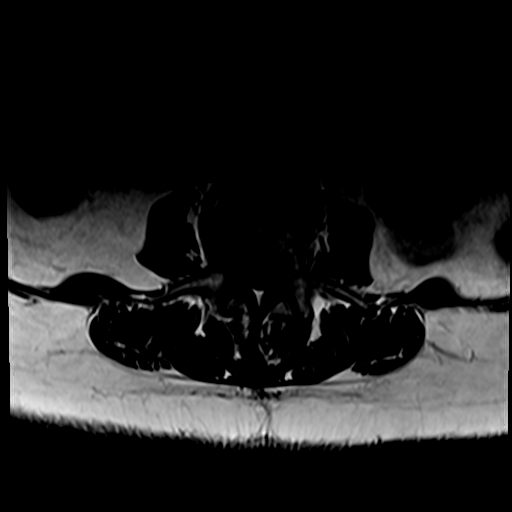
[im 23/34]
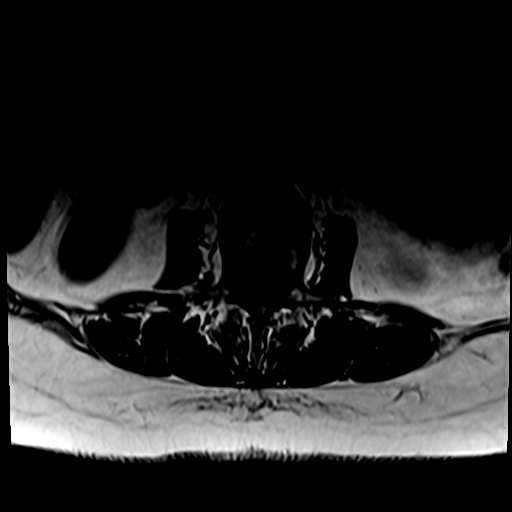
[im 28/34]
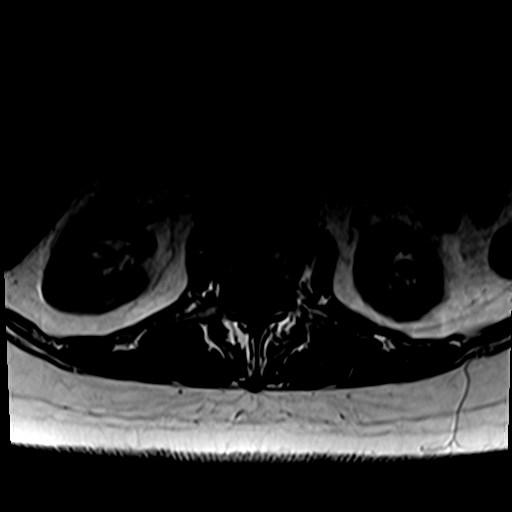
[im 34/34]
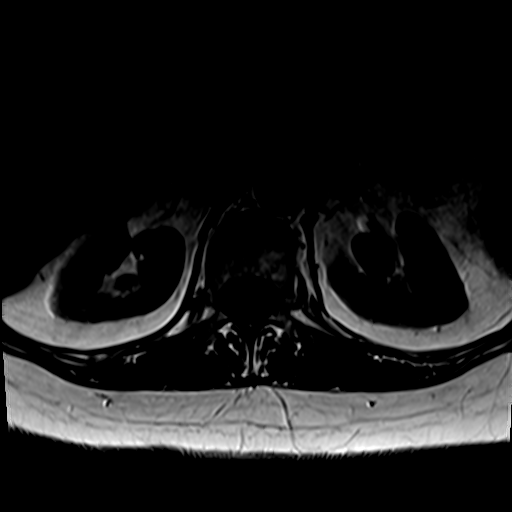

[30 of 48 positions shown; findings below may reference images not displayed]

FINDINGS: Segmentation:  5 lumbar type vertebral bodies.

Alignment:  Minimal scoliotic curvature convex to the left.

Vertebrae: T1 dark T2 bright focus within the left side of T10
measuring 12 mm in diameter. Smaller similar focus within the L2
vertebral body measuring 7 mm in size. Most likely diagnosis is
atypical hemangioma. One could not rule out myeloma or metastatic
disease based on this appearance. Mild discogenic endplate edema at
the superior endplate of L3. Old minor inferior endplate deformity
at L1.

Conus medullaris and cauda equina: Conus extends to the T12-L1
level. Conus and cauda equina appear normal.

Paraspinal and other soft tissues: Normal

Disc levels:

Mild non-compressive disc bulges from T10-11 through T12-L1.

L1-2: Old minor inferior endplate deformity at L1. Mild bulging of
the disc. No compressive stenosis.

L2-3: Mild bulging of the disc. No compressive stenosis. Superior
endplate Schmorl's node on the right at L3.

L3-4: Minimal disc bulge.  No stenosis.

L4-5: Disc degeneration with loss of height and mild circumferential
bulging. No compressive narrowing of the canal or foramina.

L5-S1: Bulging of the disc, focally prominent in the foraminal
region on the left. Foraminal narrowing on the left could possibly
affect the left L5 nerve.
IMPRESSION: T2 bright T1 dark foci within the T10 and L2 vertebral bodies as
described above. Most commonly, these represent atypical
hemangiomas. However, that could not be differentiated from
metastatic foci or myeloma.

Superior endplate Schmorl's noted L3 with some edema which could
relate to regional back pain.

L4-5 disc degeneration and disc bulge but no compressive stenosis.

L5-S1 disc degeneration disc bulge, focally prominent in the left
foraminal region, with some potential to affect the exiting left L5
nerve.

## 2021-05-25 DIAGNOSIS — R053 Chronic cough: Secondary | ICD-10-CM | POA: Diagnosis not present

## 2021-05-25 DIAGNOSIS — I471 Supraventricular tachycardia: Secondary | ICD-10-CM | POA: Diagnosis not present

## 2021-05-25 DIAGNOSIS — D472 Monoclonal gammopathy: Secondary | ICD-10-CM | POA: Diagnosis not present

## 2021-05-25 DIAGNOSIS — N189 Chronic kidney disease, unspecified: Secondary | ICD-10-CM | POA: Diagnosis not present

## 2021-05-25 DIAGNOSIS — Z0000 Encounter for general adult medical examination without abnormal findings: Secondary | ICD-10-CM | POA: Diagnosis not present

## 2021-05-25 DIAGNOSIS — Z03818 Encounter for observation for suspected exposure to other biological agents ruled out: Secondary | ICD-10-CM | POA: Diagnosis not present

## 2021-05-25 DIAGNOSIS — I129 Hypertensive chronic kidney disease with stage 1 through stage 4 chronic kidney disease, or unspecified chronic kidney disease: Secondary | ICD-10-CM | POA: Diagnosis not present

## 2021-05-25 DIAGNOSIS — E785 Hyperlipidemia, unspecified: Secondary | ICD-10-CM | POA: Diagnosis not present

## 2021-05-25 DIAGNOSIS — R059 Cough, unspecified: Secondary | ICD-10-CM | POA: Diagnosis not present

## 2021-05-25 DIAGNOSIS — I2510 Atherosclerotic heart disease of native coronary artery without angina pectoris: Secondary | ICD-10-CM | POA: Diagnosis not present

## 2021-05-26 DIAGNOSIS — G8929 Other chronic pain: Secondary | ICD-10-CM | POA: Diagnosis not present

## 2021-05-26 DIAGNOSIS — M25511 Pain in right shoulder: Secondary | ICD-10-CM | POA: Diagnosis not present

## 2021-05-26 DIAGNOSIS — M5412 Radiculopathy, cervical region: Secondary | ICD-10-CM | POA: Diagnosis not present

## 2021-06-04 DIAGNOSIS — M4802 Spinal stenosis, cervical region: Secondary | ICD-10-CM | POA: Diagnosis not present

## 2021-06-04 DIAGNOSIS — M5412 Radiculopathy, cervical region: Secondary | ICD-10-CM | POA: Diagnosis not present

## 2021-06-12 DIAGNOSIS — M79631 Pain in right forearm: Secondary | ICD-10-CM | POA: Diagnosis not present

## 2021-06-12 DIAGNOSIS — M7581 Other shoulder lesions, right shoulder: Secondary | ICD-10-CM | POA: Diagnosis not present

## 2021-06-12 DIAGNOSIS — D167 Benign neoplasm of ribs, sternum and clavicle: Secondary | ICD-10-CM | POA: Diagnosis not present

## 2021-06-12 DIAGNOSIS — M47812 Spondylosis without myelopathy or radiculopathy, cervical region: Secondary | ICD-10-CM | POA: Diagnosis not present

## 2021-06-16 ENCOUNTER — Other Ambulatory Visit: Payer: Self-pay | Admitting: Surgery

## 2021-06-16 ENCOUNTER — Other Ambulatory Visit (HOSPITAL_COMMUNITY): Payer: Self-pay | Admitting: Surgery

## 2021-06-16 DIAGNOSIS — D2120 Benign neoplasm of connective and other soft tissue of unspecified lower limb, including hip: Secondary | ICD-10-CM

## 2021-06-16 DIAGNOSIS — D167 Benign neoplasm of ribs, sternum and clavicle: Secondary | ICD-10-CM

## 2021-06-22 ENCOUNTER — Encounter: Payer: Self-pay | Admitting: Occupational Therapy

## 2021-06-22 ENCOUNTER — Other Ambulatory Visit: Payer: Self-pay

## 2021-06-22 ENCOUNTER — Ambulatory Visit: Payer: Medicare HMO | Attending: Emergency Medicine | Admitting: Occupational Therapy

## 2021-06-22 DIAGNOSIS — M25641 Stiffness of right hand, not elsewhere classified: Secondary | ICD-10-CM | POA: Diagnosis not present

## 2021-06-22 DIAGNOSIS — M79641 Pain in right hand: Secondary | ICD-10-CM | POA: Diagnosis not present

## 2021-06-22 NOTE — Therapy (Signed)
[facility] [address], [place], [address] Phone: [phone]   Fax:  [phone]  Occupational Therapy Evaluation  Patient Details  Name: Bonnie Crawford MRN: [id number] Date of Birth: 1940/10/14 Referring Provider (OT): Dr Roland Rack   Encounter Date: 06/22/2021   OT End of Session - 06/22/21 1055     Visit Number 1    Number of Visits 3    Date for OT Re-Evaluation 08/03/21    OT Start Time 0945    OT Stop Time 1032    OT Time Calculation (min) 47 min    Activity Tolerance Patient tolerated treatment well    Behavior During Therapy WFL for tasks assessed/performed             Past Medical History:  Diagnosis Date   Anxiety    Arthritis    Asthma    childhood   Chronic kidney disease    Renal Insufficiency   Colon polyp    Diverticulosis    Factor V Leiden ([place])    GERD (gastroesophageal reflux disease)    Herpes zoster    History of kidney stones    Hyperlipidemia    Hypertension    Inflammatory bowel disease    MGUS (monoclonal gammopathy of unknown significance)    Osteoporosis    PAT (paroxysmal atrial tachycardia) (HCC)    Sinus trouble    URI (upper respiratory infection)    2 months ago    Past Surgical History:  Procedure Laterality Date   ABDOMINAL HYSTERECTOMY     APPENDECTOMY     BLEPHAROPLASTY Bilateral    BREAST CYST ASPIRATION Left    neg   CARDIAC CATHETERIZATION     CATARACT EXTRACTION W/PHACO Left 07/28/2020   Procedure: CATARACT EXTRACTION PHACO AND INTRAOCULAR LENS PLACEMENT (IOC) LEFT PANOPTIX TORIC 4.25 00:30.9;  Surgeon: Eulogio Bear, MD;  Location: [facility];  Service: Ophthalmology;  Laterality: Left;   CATARACT EXTRACTION W/PHACO Right 08/18/2020   Procedure: CATARACT EXTRACTION PHACO AND INTRAOCULAR LENS PLACEMENT (IOC) RIGHT PANOPTIX TORIC 2.31 00:20.0;  Surgeon: Eulogio Bear, MD;  Location: [facility];  Service:  Ophthalmology;  Laterality: Right;   COLONOSCOPY     KNEE SURGERY Left    2 meniscus tears   NECK SURGERY     cosmetic    There were no vitals filed for this visit.   Subjective Assessment - 06/22/21 1737     Subjective  Pt report having pain now on and off  for 4 rys in R hand thumb  and down into her forearm - with gripping , vacuum , cooking - veins   will pop up and she has to lift her hand -or drop or let go of object    Pertinent History Ortho consult 06/12/21 with DR Roland Rack - She is having  intermittent stabbing pain to the thenar eminence of her right hand. These symptoms are aggravated by grasping or holding objects. In fact, she notes that she will occasionally have to let go of a pot or pan due to the pain. She denies any numbness or paresthesias to her hand, denies any specific injury to the area. However, she does recall an incident about 4 years ago which she underwent a cardiac catheterization utilizing the right radial artery as an access point, and wonders if this may have played a role in her present symptoms. Prior to leaving the facility, the pressure dressing was removed and she was  noted to have increased subcutaneous bleeding/leakage from the radial artery, so they had to re-apply pressure for a period of time. She also notes a painful focal area of subcutaneous fullness in the mid forearm region.Finally, regarding her right forearm pain, she will be referred to occupational therapy    Patient Stated Goals Wants the pain better in my thumb and forearm so I can do things around the house    Currently in Pain? Yes    Pain Score 5     Pain Location --   thumb thenar eminence   Pain Orientation Right    Pain Descriptors / Indicators Aching;Tender    Pain Type Chronic pain    Pain Onset More than a month ago    Pain Frequency Intermittent               [facility] OT Assessment - 06/22/21 0001       Assessment   Medical Diagnosis R thumb thenar and forearm pain     Referring Provider (OT) Dr Roland Rack    Onset Date/Surgical Date 06/17/17    Hand Dominance Right      Home  Environment   Lives With Spouse      Prior Function   Vocation Retired    Leisure house work, cooking , reading      AROM   Right Wrist Extension 60 Degrees   pain   Right Wrist Flexion 90 Degrees    Right Wrist Radial Deviation 25 Degrees   pain   Right Wrist Ulnar Deviation 30 Degrees      Strength   Right Hand Grip (lbs) 49    Right Hand Lateral Pinch 13 lbs    Right Hand 3 Point Pinch 11 lbs    Left Hand Grip (lbs) 50    Left Hand Lateral Pinch 13 lbs    Left Hand 3 Point Pinch 12 lbs      Right Hand AROM   R Thumb Opposition to Index --   opposition to base of 5th  -pain over thumb                     OT Treatments/Exercises (OP) - 06/22/21 0001       RUE Paraffin   Number Minutes Paraffin --    RUE Paraffin Location Hand   wrist   Comments decrease pain and decrease motion            decrease pain after paraffin - pt has unit at home  [facility] neoprene splint support CMC of thumb but also FPB - to assess if decrease pain with gripping  Pt to use larger joint and built up handles to decrease pain with thumb use          OT Education - 06/22/21 1055     Education Details Findings of eval and HEP    Person(s) Educated Patient    Methods Explanation;Demonstration;Tactile cues;Verbal cues;Handout    Comprehension Verbal cues required;Returned demonstration;Verbalized understanding                 OT Long Term Goals - 06/22/21 1753       OT LONG TERM GOAL #1   Title Pt to be independent in HEP to decrease pain less than 2/10 in thumb and volar wrist less than 3 days week    Baseline Pt has pain daily and with gripping , cooking , cleaning , laundry pain 1-10/10 -  but 5/10 with ROM - 10/10  when pushing or hitting thumb    Time 4    Period Weeks    Status New    Target Date 07/20/21      OT LONG TERM GOAL #2   Title Pt report  pain in thumb and wrist less than 2/10 with  vacuum, cooking, cleaning and laundry    Baseline pain 1-5/10 with use - when hitting thumb against something 10/10 - tender over thenar eminence -pain with thumb flexion to [facility]    Time 6    Period Weeks    Status New    Target Date 08/03/21                   Plan - 06/22/21 1745     Clinical Impression Statement Pt refer to OT with R thumb thenar and forearm pain - with gripping activities. Pt tender over volar thenar eminence at [facility] but negative for grinding test  and thumb PA and RA.  Pain with end rang wrist RD  and extention. Pain with thumb flexion to [facility] and opposition and resistance- with pull in volar wrist. ? OA of CMC or tendinitis of FPB. Pt ed on joint protection and modifiations of tasks to use larger joints or enlarge grips. Pt had less pain after paraffin and fitted with CMC neoprene splint to use with acitivities that causing pain but not night time or rest. Pt's grip and prehension strength at top range for her age and no pain. Pt to do HEP for 1-2 wks and follow up - pt limited in use of L hand in  ADL's and IADL's  because of pain - pt can benefit from skilled OT services    OT Occupational Profile and History Problem Focused Assessment - Including review of records relating to presenting problem    Occupational performance deficits (Please refer to evaluation for details): ADL's;IADL's;Play;Leisure    Body Structure / Function / Physical Skills ADL;Pain;UE functional use    Rehab Potential Fair    Clinical Decision Making Limited treatment options, no task modification necessary    Comorbidities Affecting Occupational Performance: None    Modification or Assistance to Complete Evaluation  No modification of tasks or assist necessary to complete eval             Patient will benefit from skilled therapeutic intervention in order to improve the following deficits and impairments:   Body Structure / Function / Physical  Skills: ADL, Pain, UE functional use       Visit Diagnosis: Pain in right hand - Plan: Ot plan of care cert/re-cert  Stiffness of right hand, not elsewhere classified - Plan: Ot plan of care cert/re-cert    Problem List Patient Active Problem List   Diagnosis Date Noted   Lymphedema 03/09/2020   Stage 3a chronic kidney disease ([place]) 12/04/2019   MGUS (monoclonal gammopathy of unknown significance) 01/23/2019   Osteoporosis 11/06/2018   PAT (paroxysmal atrial tachycardia) ([place]) 03/23/2018   Osteopenia with high risk of fracture 11/14/2017   History of gastric ulcer 11/03/2017   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, [place], [address] Phone: [phone]   Fax:  [phone]  Name: Ashland Osmer MRN: [id number] Date of Birth: Jan 31, 1941

## 2021-07-02 ENCOUNTER — Ambulatory Visit
Admission: RE | Admit: 2021-07-02 | Discharge: 2021-07-02 | Disposition: A | Discharge status: Home / Self Care | Payer: Medicare HMO | Source: Ambulatory Visit | Attending: Surgery | Admitting: Surgery

## 2021-07-02 ENCOUNTER — Other Ambulatory Visit: Payer: Self-pay

## 2021-07-02 DIAGNOSIS — M25461 Effusion, right knee: Secondary | ICD-10-CM | POA: Diagnosis not present

## 2021-07-02 DIAGNOSIS — M25462 Effusion, left knee: Secondary | ICD-10-CM | POA: Diagnosis not present

## 2021-07-02 DIAGNOSIS — D2120 Benign neoplasm of connective and other soft tissue of unspecified lower limb, including hip: Secondary | ICD-10-CM | POA: Diagnosis not present

## 2021-07-02 DIAGNOSIS — R222 Localized swelling, mass and lump, trunk: Secondary | ICD-10-CM | POA: Diagnosis not present

## 2021-07-02 DIAGNOSIS — M19011 Primary osteoarthritis, right shoulder: Secondary | ICD-10-CM | POA: Diagnosis not present

## 2021-07-02 DIAGNOSIS — D167 Benign neoplasm of ribs, sternum and clavicle: Secondary | ICD-10-CM | POA: Diagnosis not present

## 2021-07-02 IMAGING — MR MR [PERSON_NAME] LOW WO/W CM*L*
2 series · 31 of 40 positions shown · IV contrast (7ml Gadavist)
Comparison: None.
COMPARISON: None.

Addendum:
CLINICAL DATA: Bilateral lower leg burning and swelling for several
years.

EXAM:
MRI OF LOWER RIGHT EXTREMITY WITHOUT AND WITH CONTRAST
MRI OF LOWER LEFT EXTREMITY WITHOUT AND WITH CONTRAST
TECHNIQUE: Multiplanar, multisequence MR imaging of the right lower leg was
performed both before and after administration of intravenous
contrast.
Multiplanar, multisequence MR imaging of the left lower leg was
CONTRAST:  7mL GADAVIST GADOBUTROL 1 MMOL/ML IV SOLN

[Series 22: t1_tse_dixon_sag right_in · sagittal · B · 4.5mm · 0.83mm/px · 20 of 30 slices shown]
[im 1/30]
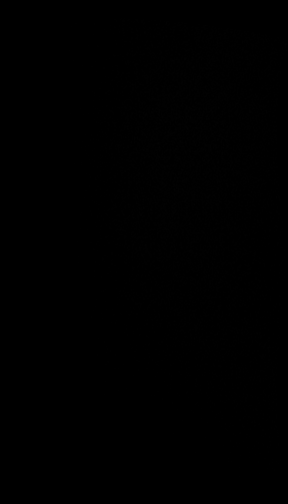
[im 2/30]
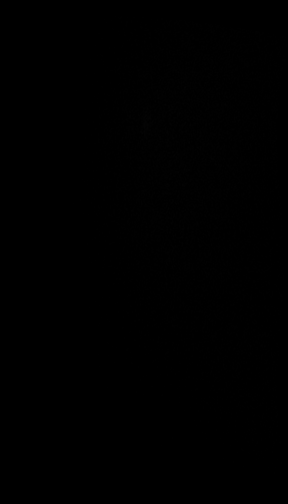
[im 4/30]
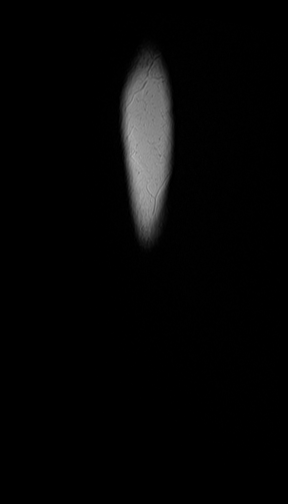
[im 5/30]
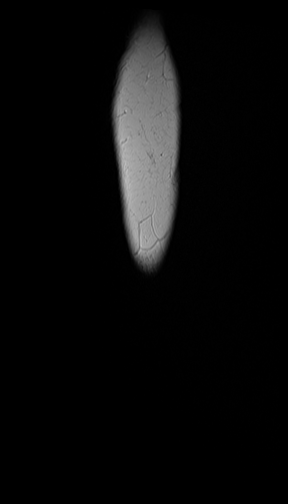
[im 7/30]
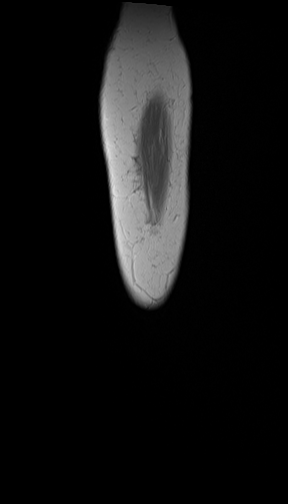
[im 8/30]
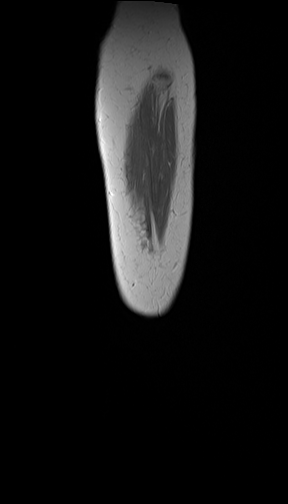
[im 10/30]
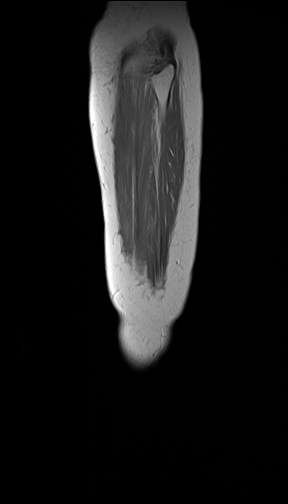
[im 11/30]
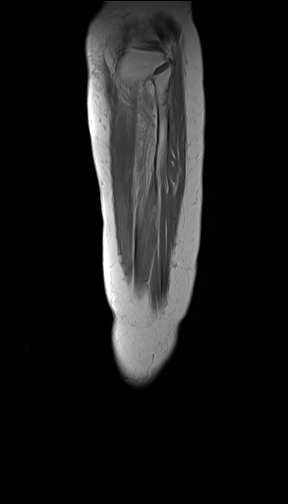
[im 13/30]
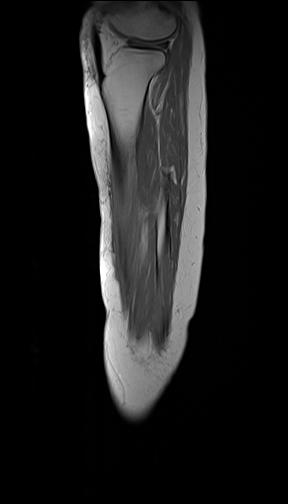
[im 14/30]
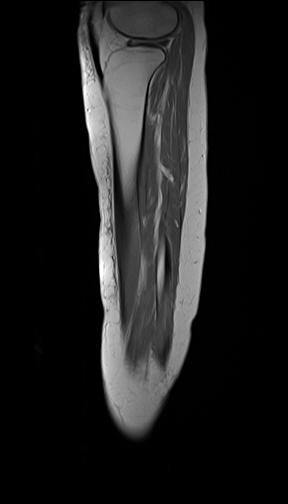
[im 16/30]
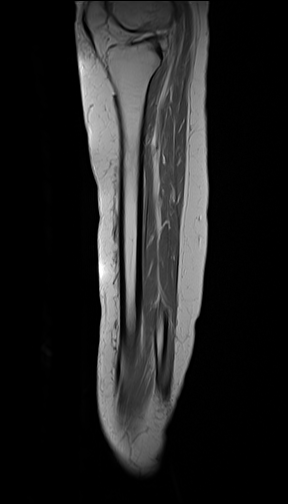
[im 17/30]
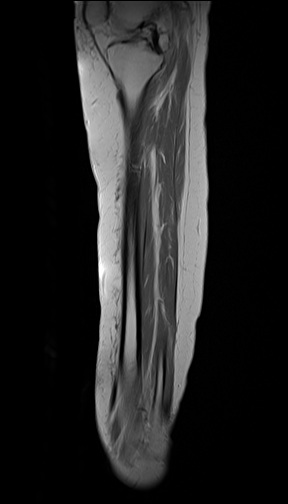
[im 19/30]
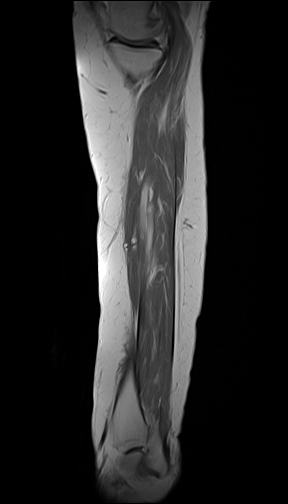
[im 20/30]
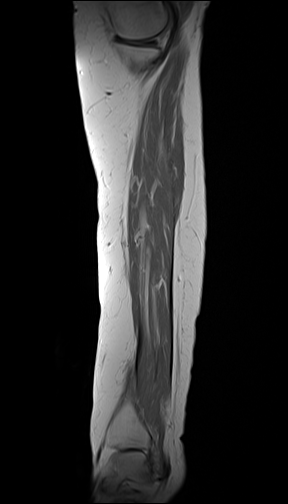
[im 22/30]
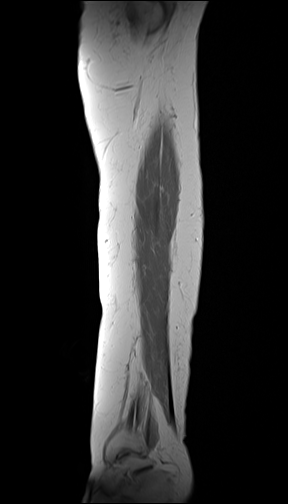
[im 23/30]
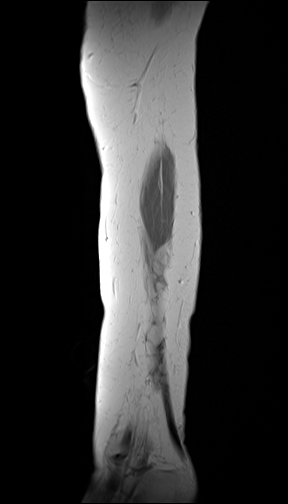
[im 25/30]
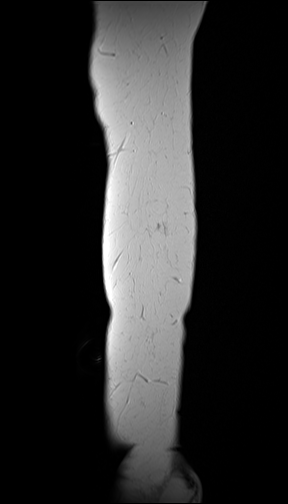
[im 26/30]
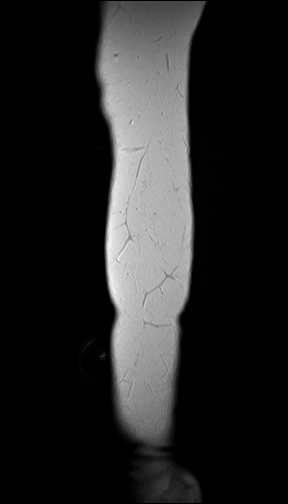
[im 28/30]
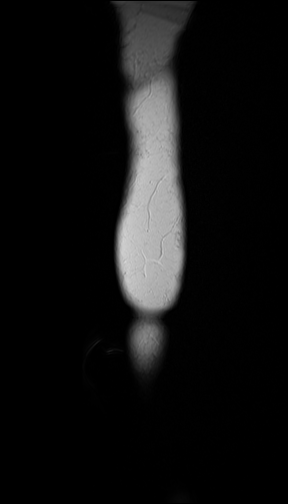
[im 30/30]
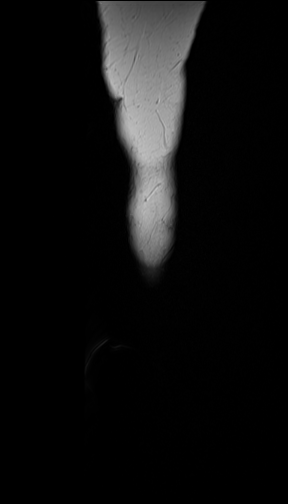

[Series 23: t1_tse_dixon_sag right_w · sagittal · B · 4.5mm · 0.83mm/px · 11 of 29 slices shown]
[im 1/29]
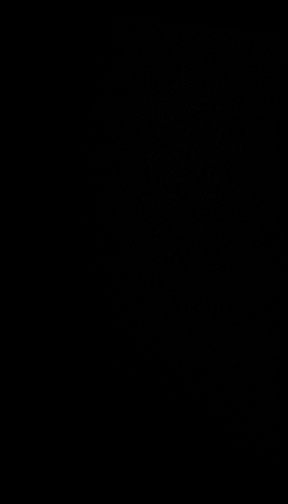
[im 2/29]
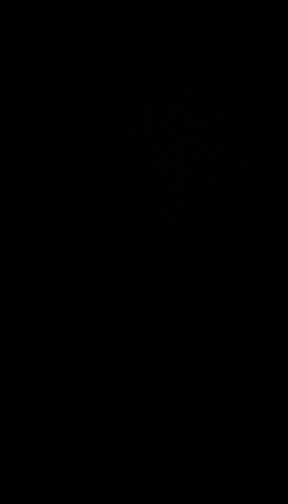
[im 3/29]
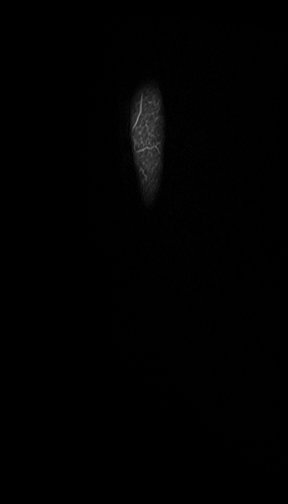
[im 5/29]
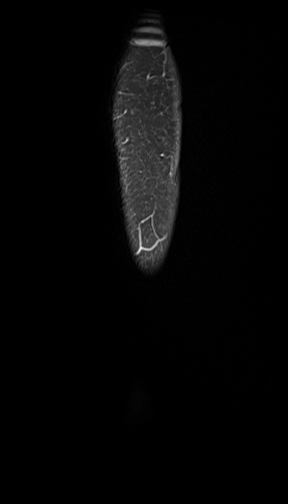
[im 9/29]
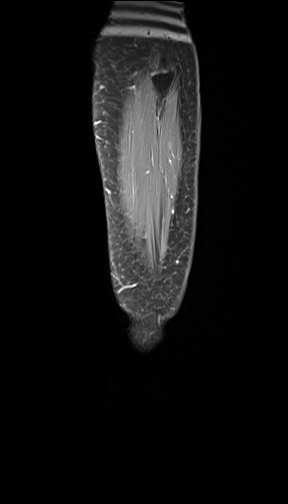
[im 12/29]
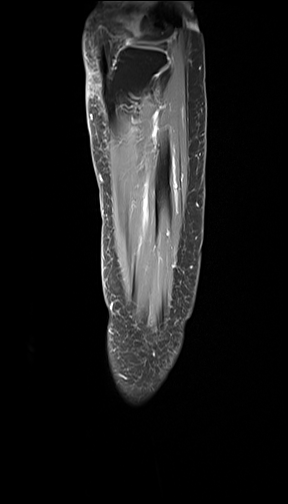
[im 15/29]
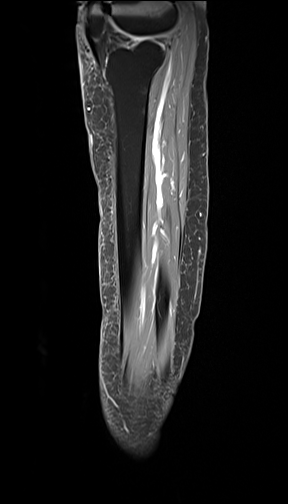
[im 17/29]
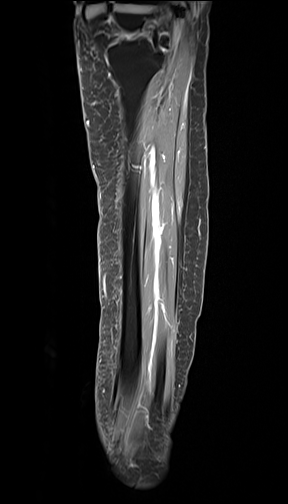
[im 20/29]
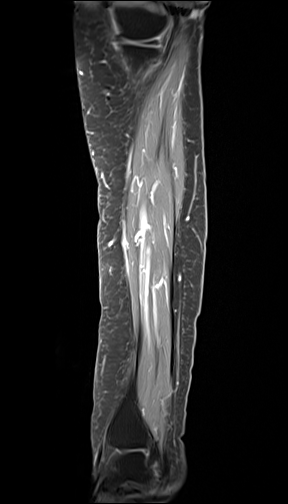
[im 24/29]
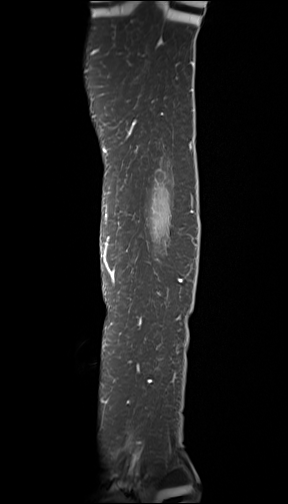
[im 27/29]
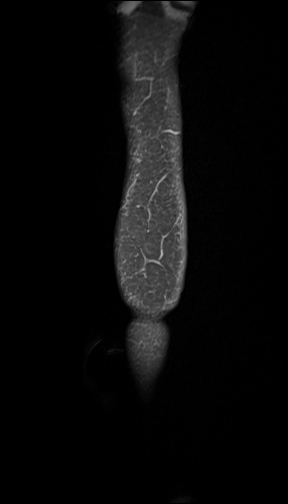

[31 of 40 positions shown; findings below may reference images not displayed]

FINDINGS: Bones/Joint/Cartilage

No fracture or dislocation. Normal alignment. Small bilateral knee
joint effusions. 10 mm loose body in the right knee posterior to the
PCL. Small right Baker's cyst. No marrow signal abnormality.

Suggestion of a tear of the anterior horn-body junction of the right
lateral meniscus.

High-grade partial-thickness cartilage loss of the left lateral
femorotibial compartment. Partially visualized tear of the anterior
horn-body junction of the left lateral meniscus.

Partial-thickness cartilage loss of the patellofemoral compartment
bilaterally.

Ligaments

Collateral ligaments are intact.  Lisfranc ligament is intact.

Muscles and Tendons

Flexor, peroneal and extensor compartment tendons are intact. Mild
atrophy of the inferior aspect of the left medial gastrocnemius
muscle. Muscles are otherwise normal. No muscle edema.

Soft tissue
No fluid collection or hematoma. No soft tissue mass. Normal
neurovascular bundles bilaterally.
IMPRESSION: 1.  No acute osseous injury of the right tibia and fibula.
2.  No acute osseous injury of the left tibia fibula.
3. No acute muscle abnormality or muscle edema of bilateral lower
legs.
4. Suggestion of a tear of the anterior horn-body junction of the
right lateral meniscus.
5. High-grade partial-thickness cartilage loss of the left lateral
femorotibial compartment.
6. Partially visualized tear of the anterior horn-body junction of
the left lateral meniscus.

ADDENDUM:
Typographical error in the original dictation. Under the findings in
the ligament section it should read as follows:

Collateral ligaments are intact.

*** End of Addendum ***
FINDINGS: Bones/Joint/Cartilage

No fracture or dislocation. Normal alignment. Small bilateral knee
joint effusions. 10 mm loose body in the right knee posterior to the
PCL. Small right Baker's cyst. No marrow signal abnormality.

Suggestion of a tear of the anterior horn-body junction of the right
lateral meniscus.

High-grade partial-thickness cartilage loss of the left lateral
femorotibial compartment. Partially visualized tear of the anterior
horn-body junction of the left lateral meniscus.

Partial-thickness cartilage loss of the patellofemoral compartment
bilaterally.

Ligaments

Collateral ligaments are intact.  Lisfranc ligament is intact.

Muscles and Tendons

Flexor, peroneal and extensor compartment tendons are intact. Mild
atrophy of the inferior aspect of the left medial gastrocnemius
muscle. Muscles are otherwise normal. No muscle edema.

Soft tissue
No fluid collection or hematoma. No soft tissue mass. Normal
neurovascular bundles bilaterally.
IMPRESSION: 1.  No acute osseous injury of the right tibia and fibula.
2.  No acute osseous injury of the left tibia fibula.
3. No acute muscle abnormality or muscle edema of bilateral lower
legs.
4. Suggestion of a tear of the anterior horn-body junction of the
right lateral meniscus.
5. High-grade partial-thickness cartilage loss of the left lateral
femorotibial compartment.
6. Partially visualized tear of the anterior horn-body junction of
the left lateral meniscus.

## 2021-07-02 IMAGING — MR MR STERNUM WO/W CM
20 series · 20 of 20 positions shown · IV contrast (gadavist)
Comparison: None.

CLINICAL DATA: No known injury, mass on the chest.

EXAM:
MR CHEST WITH AND WITHOUT CONTRAST
TECHNIQUE: Multiplanar, multisequence MR imaging of the sternum was performed
before and after the administration of intravenous contrast.
CONTRAST:  7mL GADAVIST GADOBUTROL 1 MMOL/ML IV SOLN

[Series 4: T2 · coronal · B · 6.0mm · 1.19mm/px · 1 of 15 slices shown (1 of 2)]
[im 1/15]
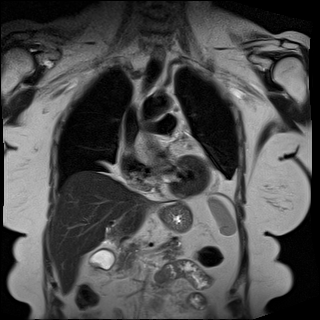

[Series 5: T2 · axial · B · 6.0mm · 1.19mm/px · 1 of 32 slices shown (2 of 2)]
[im 1/32]
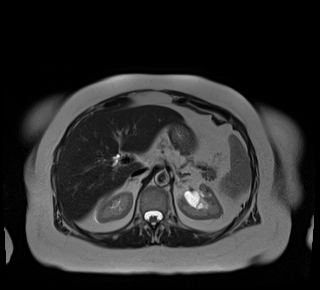

[Series 7: T2 fat-sat · axial · B · 6.0mm · 1.19mm/px · 1 of 34 slices shown]
[im 1/34]
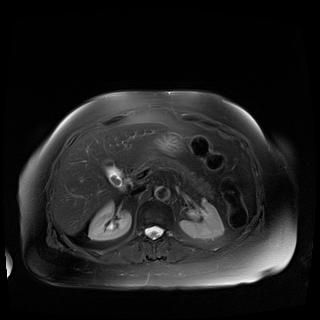

[Series 8: T1 · axial · B · 6.0mm · 0.74mm/px · 1 of 64 slices shown (1 of 2)]
[im 1/64]
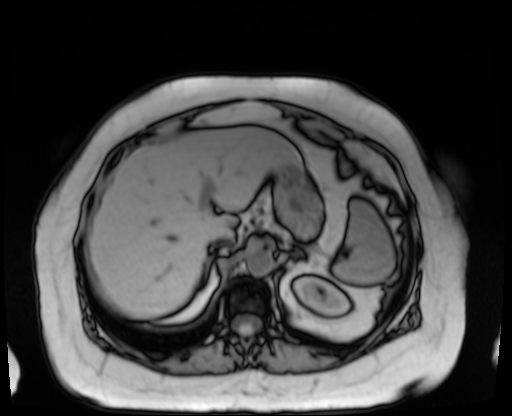

[Series 9: PD fat-sat · sagittal · B · 3.0mm · 0.78mm/px · 1 of 31 slices shown]
[im 1/31]
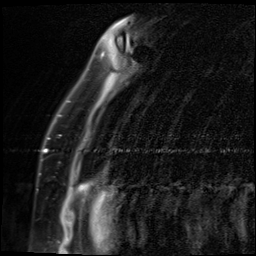

[Series 10: bSSFP · axial · B · 6.0mm · 0.74mm/px · 1 of 32 slices shown]
[im 1/32]
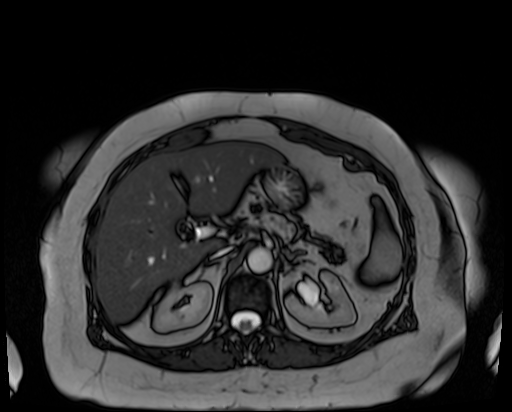

[Series 11: ax dwi_tracew · axial · B · 6.0mm · 1.42mm/px · 1 of 100 slices shown]
[im 1/100]
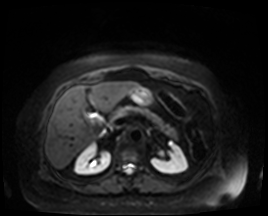

[Series 12: ax dwi_adc · axial · B · 6.0mm · 1.42mm/px · 1 of 34 slices shown]
[im 1/34]
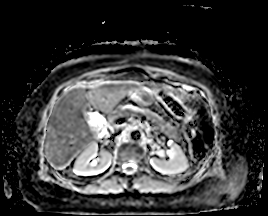

[Series 13: STIR · coronal · B · 3.0mm · 0.78mm/px · 1 of 20 slices shown]
[im 1/20]
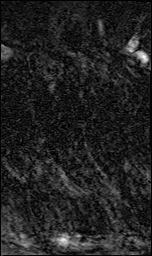

[Series 14: T1 dynamic fat-sat · axial · non-contrast · B · 3.0mm · 1.19mm/px · 1 of 80 slices shown (1 of 5)]
[im 1/80]
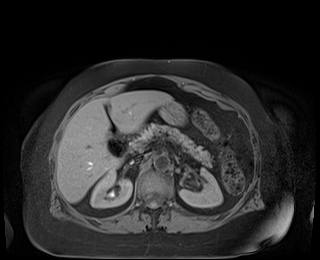

[Series 16: T1 · coronal · B · 3.0mm · 0.78mm/px · 1 of 20 slices shown (2 of 2)]
[im 1/20]
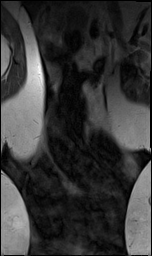

[Series 17: T1 dynamic fat-sat post-contrast · axial · B · 3.0mm · 1.19mm/px · 1 of 80 slices shown (1 of 4)]
[im 1/80]
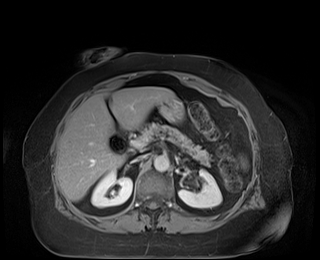

[Series 18: T1 dynamic fat-sat · axial · B · 3.0mm · 1.19mm/px · 1 of 80 slices shown (2 of 5)]
[im 1/80]
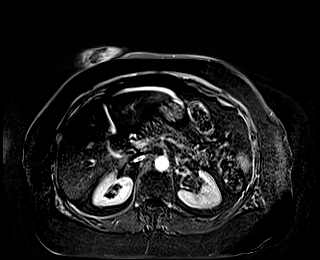

[Series 19: T1 dynamic fat-sat post-contrast · axial · B · 3.0mm · 1.19mm/px · 1 of 80 slices shown (2 of 4)]
[im 1/80]
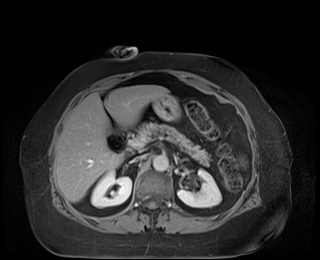

[Series 20: T1 dynamic fat-sat · axial · B · 3.0mm · 1.19mm/px · 1 of 80 slices shown (3 of 5)]
[im 1/80]
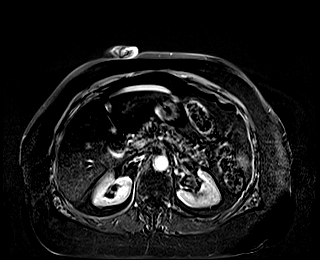

[Series 21: T1 dynamic fat-sat post-contrast · axial · B · 3.0mm · 1.19mm/px · 1 of 80 slices shown (3 of 4)]
[im 1/80]
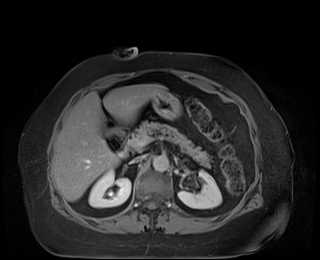

[Series 22: T1 dynamic fat-sat · axial · B · 3.0mm · 1.19mm/px · 1 of 80 slices shown (4 of 5)]
[im 1/80]
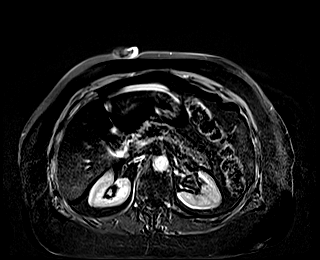

[Series 23: T1 dynamic post-contrast · coronal · B · 3.0mm · 1.31mm/px · 1 of 72 slices shown]
[im 1/72]
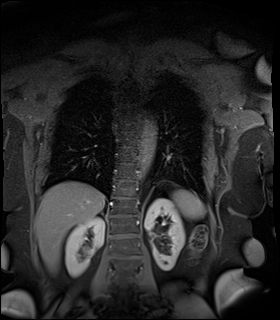

[Series 24: T1 dynamic fat-sat post-contrast · axial · B · 3.0mm · 1.19mm/px · 1 of 80 slices shown (4 of 4)]
[im 1/80]
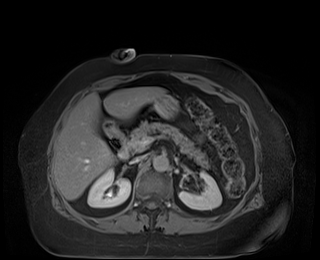

[Series 25: T1 dynamic fat-sat · axial · B · 3.0mm · 1.19mm/px · 1 of 80 slices shown (5 of 5)]
[im 1/80]
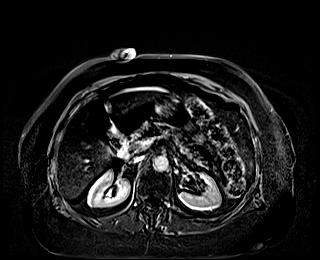

[20 of 20 positions shown; findings below may reference images not displayed]

FINDINGS: Bones/Joint/Cartilage

No fracture or dislocation. Normal alignment. No joint effusion. No
marrow signal abnormality. Mild arthropathy of the right
sternoclavicular joint. Visualized glenohumeral joints demonstrate
no focal abnormality.

Muscles and Tendons

Muscles are normal.

Soft tissue
No fluid collection or hematoma.  No soft tissue mass.
IMPRESSION: 1. No soft tissue mass or fluid collection along the anterior chest
wall
2. Mild arthropathy of the right sternoclavicular joint.

## 2021-07-02 IMAGING — MR MR [PERSON_NAME] LOW WO/W CM*R*
6 of 10 series · 30 of 40 positions shown · IV contrast (7ml Gadavist)
Comparison: None.
COMPARISON: None.

Addendum:
CLINICAL DATA: Bilateral lower leg burning and swelling for several
years.

EXAM:
MRI OF LOWER RIGHT EXTREMITY WITHOUT AND WITH CONTRAST
MRI OF LOWER LEFT EXTREMITY WITHOUT AND WITH CONTRAST
TECHNIQUE: Multiplanar, multisequence MR imaging of the right lower leg was
performed both before and after administration of intravenous
contrast.
Multiplanar, multisequence MR imaging of the left lower leg was
CONTRAST:  7mL GADAVIST GADOBUTROL 1 MMOL/ML IV SOLN

[Series 4: t1_tse_cor_480_bilat_ · coronal · B · 4.0mm · 0.41mm/px · 2 of 34 slices shown]
[im 1/34]
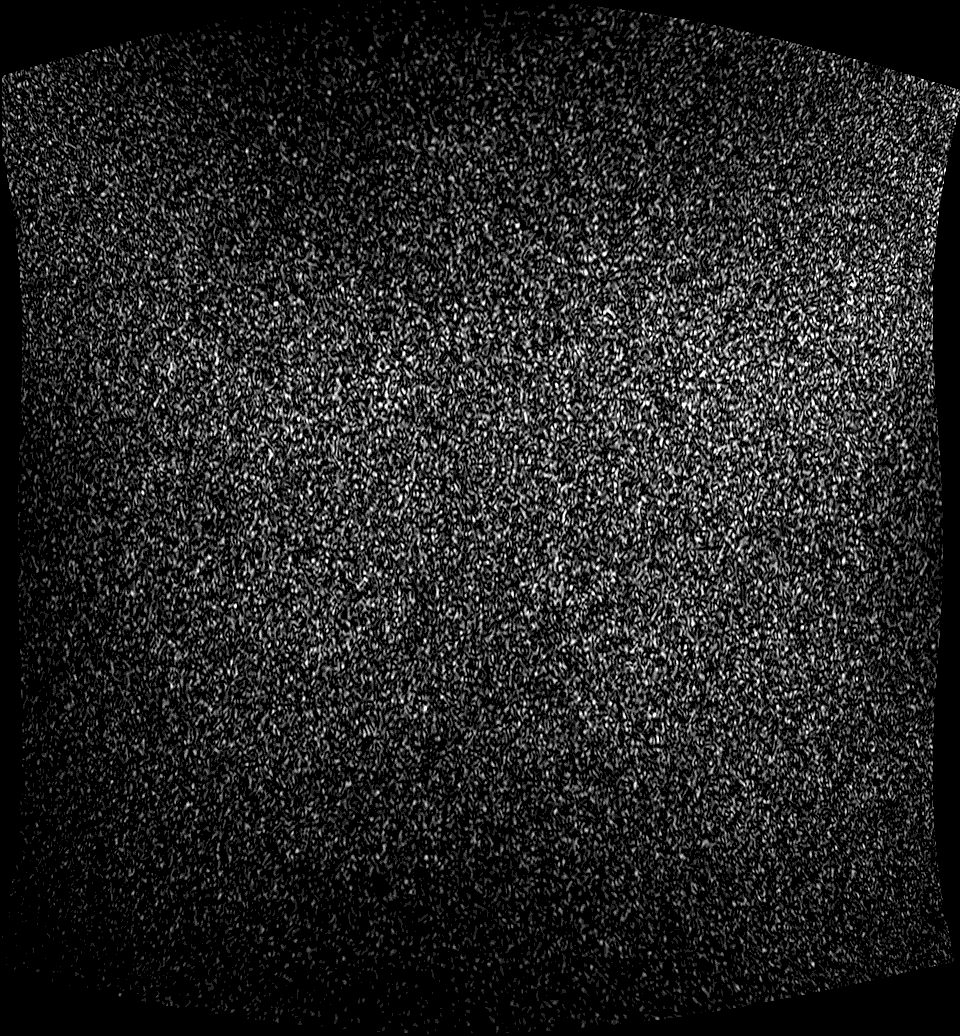
[im 34/34]
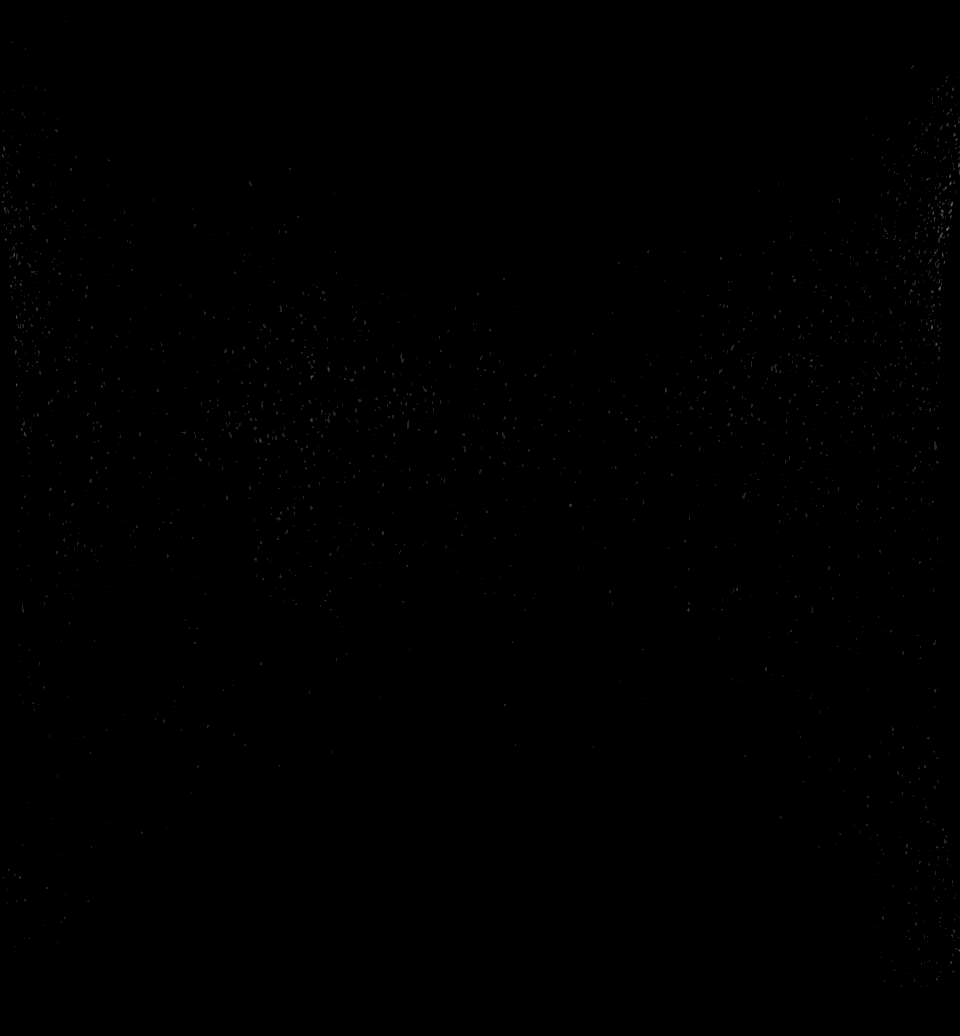

[Series 5: t2_tse_stir_cor_352_bilat_ · coronal · B · 4.0mm · 0.55mm/px · 2 of 32 slices shown]
[im 1/32]
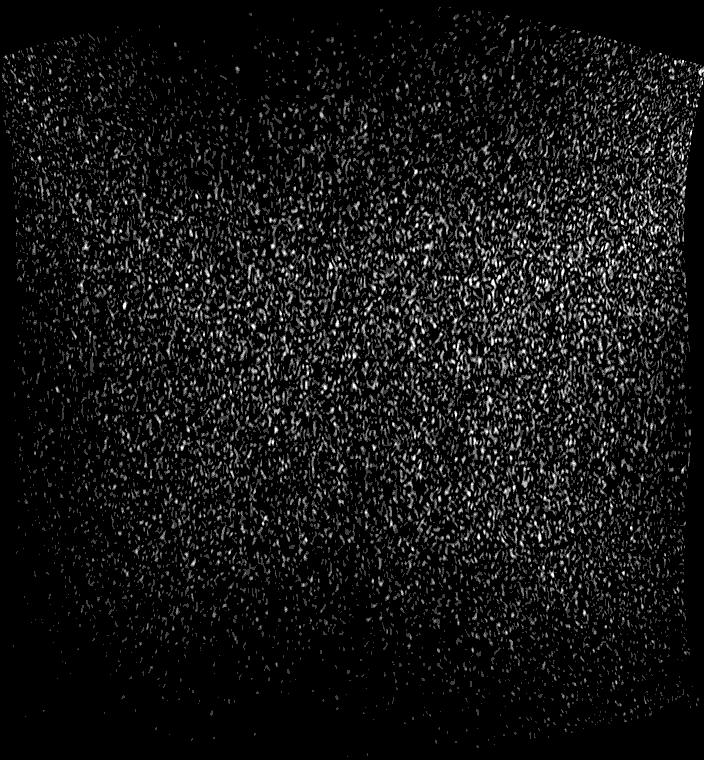
[im 32/32]
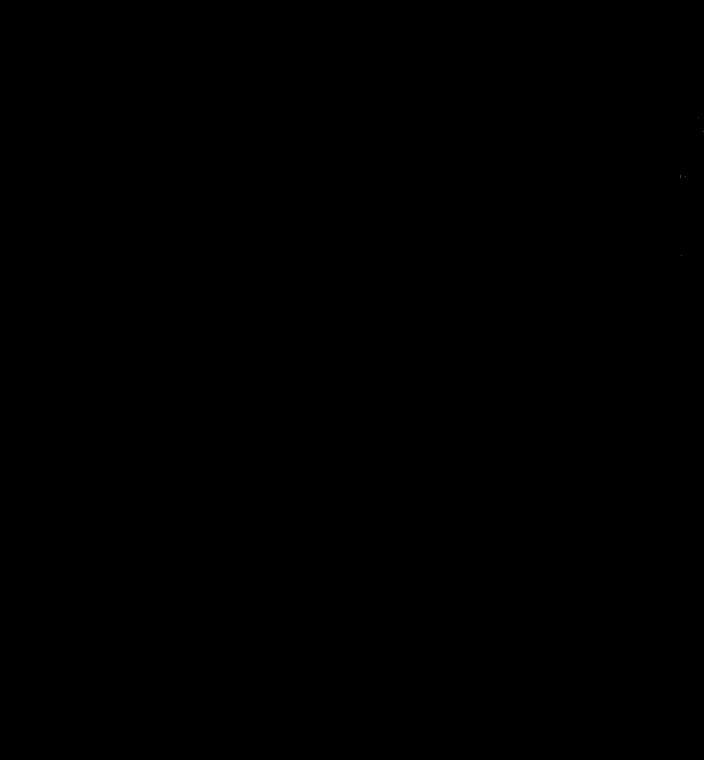

[Series 8: ax t1_comp_filt · axial · B · 4.0mm · 1.25mm/px · z∈[-288,+147]mm · 7 of 88 slices shown]
[im 1/88]
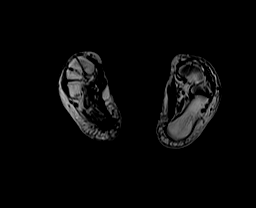
[im 15/88]
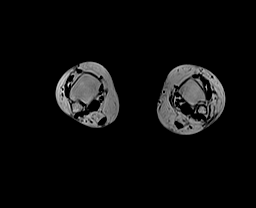
[im 30/88]
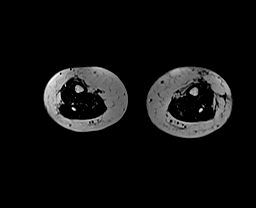
[im 44/88]
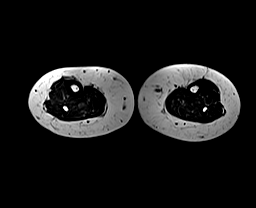
[im 59/88]
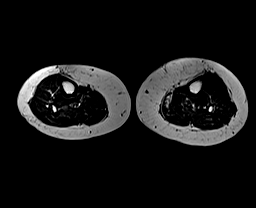
[im 73/88]
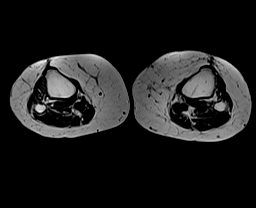
[im 88/88]
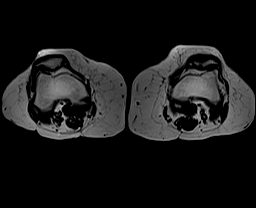

[Series 11: T2 · axial · B · 4.0mm · 1.18mm/px · z∈[-288,+2]mm · 5 of 88 slices shown]
[im 1/88]
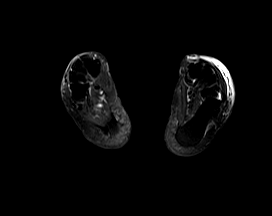
[im 15/88]
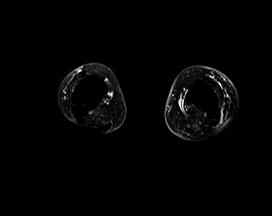
[im 30/88]
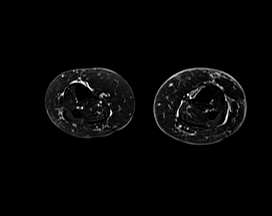
[im 44/88]
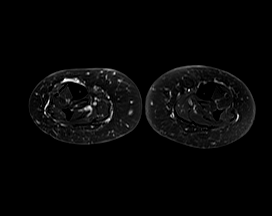
[im 59/88]
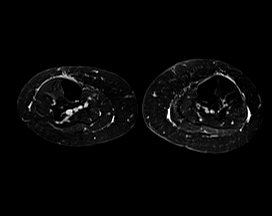

[Series 16: T1 fat-sat · axial · B · 4.0mm · 1.25mm/px · z∈[-288,+147]mm · 7 of 88 slices shown (1 of 2)]
[im 1/88]
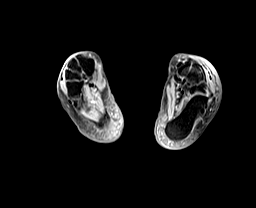
[im 15/88]
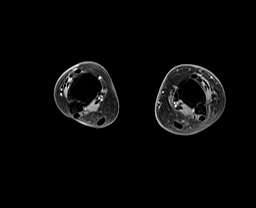
[im 30/88]
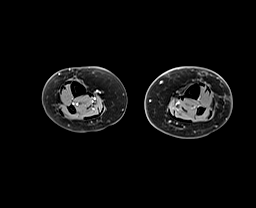
[im 44/88]
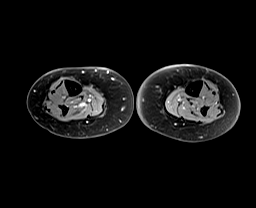
[im 59/88]
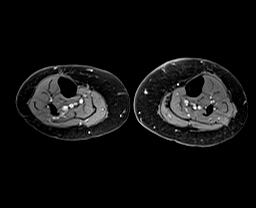
[im 73/88]
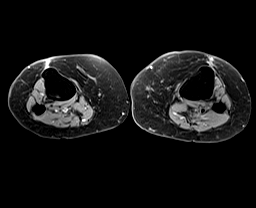
[im 88/88]
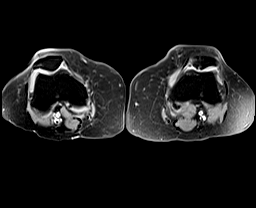

[Series 19: T1 fat-sat · axial · B · 4.0mm · 1.25mm/px · z∈[-288,+147]mm · 7 of 88 slices shown (2 of 2)]
[im 1/88]
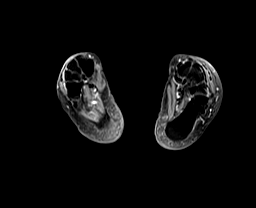
[im 15/88]
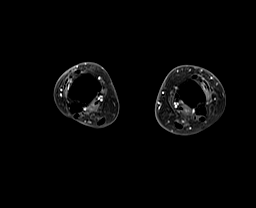
[im 30/88]
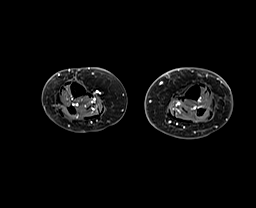
[im 44/88]
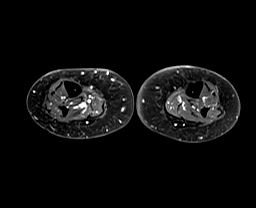
[im 59/88]
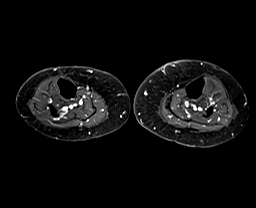
[im 73/88]
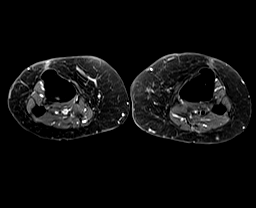
[im 88/88]
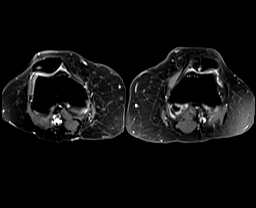

[30 of 40 positions shown; findings below may reference images not displayed]

FINDINGS: Bones/Joint/Cartilage

No fracture or dislocation. Normal alignment. Small bilateral knee
joint effusions. 10 mm loose body in the right knee posterior to the
PCL. Small right Baker's cyst. No marrow signal abnormality.

Suggestion of a tear of the anterior horn-body junction of the right
lateral meniscus.

High-grade partial-thickness cartilage loss of the left lateral
femorotibial compartment. Partially visualized tear of the anterior
horn-body junction of the left lateral meniscus.

Partial-thickness cartilage loss of the patellofemoral compartment
bilaterally.

Ligaments

Collateral ligaments are intact.  Lisfranc ligament is intact.

Muscles and Tendons

Flexor, peroneal and extensor compartment tendons are intact. Mild
atrophy of the inferior aspect of the left medial gastrocnemius
muscle. Muscles are otherwise normal. No muscle edema.

Soft tissue
No fluid collection or hematoma. No soft tissue mass. Normal
neurovascular bundles bilaterally.
IMPRESSION: 1.  No acute osseous injury of the right tibia and fibula.
2.  No acute osseous injury of the left tibia fibula.
3. No acute muscle abnormality or muscle edema of bilateral lower
legs.
4. Suggestion of a tear of the anterior horn-body junction of the
right lateral meniscus.
5. High-grade partial-thickness cartilage loss of the left lateral
femorotibial compartment.
6. Partially visualized tear of the anterior horn-body junction of
the left lateral meniscus.

ADDENDUM:
Typographical error in the original dictation. Under the findings in
the ligament section it should read as follows:

Collateral ligaments are intact.

*** End of Addendum ***
FINDINGS: Bones/Joint/Cartilage

No fracture or dislocation. Normal alignment. Small bilateral knee
joint effusions. 10 mm loose body in the right knee posterior to the
PCL. Small right Baker's cyst. No marrow signal abnormality.

Suggestion of a tear of the anterior horn-body junction of the right
lateral meniscus.

High-grade partial-thickness cartilage loss of the left lateral
femorotibial compartment. Partially visualized tear of the anterior
horn-body junction of the left lateral meniscus.

Partial-thickness cartilage loss of the patellofemoral compartment
bilaterally.

Ligaments

Collateral ligaments are intact.  Lisfranc ligament is intact.

Muscles and Tendons

Flexor, peroneal and extensor compartment tendons are intact. Mild
atrophy of the inferior aspect of the left medial gastrocnemius
muscle. Muscles are otherwise normal. No muscle edema.

Soft tissue
No fluid collection or hematoma. No soft tissue mass. Normal
neurovascular bundles bilaterally.
IMPRESSION: 1.  No acute osseous injury of the right tibia and fibula.
2.  No acute osseous injury of the left tibia fibula.
3. No acute muscle abnormality or muscle edema of bilateral lower
legs.
4. Suggestion of a tear of the anterior horn-body junction of the
right lateral meniscus.
5. High-grade partial-thickness cartilage loss of the left lateral
femorotibial compartment.
6. Partially visualized tear of the anterior horn-body junction of
the left lateral meniscus.

## 2021-07-02 MED ORDER — GADOBUTROL 1 MMOL/ML IV SOLN
7.0000 mL | Freq: Once | INTRAVENOUS | Status: AC | PRN
  Administered 2021-07-02: 7 mL via INTRAVENOUS

## 2021-07-06 ENCOUNTER — Other Ambulatory Visit: Payer: Self-pay

## 2021-07-06 ENCOUNTER — Ambulatory Visit: Payer: Medicare HMO | Admitting: Occupational Therapy

## 2021-07-06 DIAGNOSIS — M79641 Pain in right hand: Secondary | ICD-10-CM

## 2021-07-06 DIAGNOSIS — M25641 Stiffness of right hand, not elsewhere classified: Secondary | ICD-10-CM | POA: Diagnosis not present

## 2021-07-06 NOTE — Therapy (Signed)
[facility] [address], [place], [address] Phone: [phone]   Fax:  [phone]  Occupational Therapy Treatment/discharge:  Patient Details  Name: Bonnie Crawford MRN: [id number] Date of Birth: Jun 30, 1940 Referring Provider (OT): Dr Roland Rack   Encounter Date: 07/06/2021   OT End of Session - 07/06/21 1007     Visit Number 2    Number of Visits 2    Date for OT Re-Evaluation 07/06/21    OT Start Time 0945    OT Stop Time 1002    OT Time Calculation (min) 17 min    Activity Tolerance Patient tolerated treatment well    Behavior During Therapy WFL for tasks assessed/performed             Past Medical History:  Diagnosis Date   Anxiety    Arthritis    Asthma    childhood   Chronic kidney disease    Renal Insufficiency   Colon polyp    Diverticulosis    Factor V Leiden ([place])    GERD (gastroesophageal reflux disease)    Herpes zoster    History of kidney stones    Hyperlipidemia    Hypertension    Inflammatory bowel disease    MGUS (monoclonal gammopathy of unknown significance)    Osteoporosis    PAT (paroxysmal atrial tachycardia) (HCC)    Sinus trouble    URI (upper respiratory infection)    2 months ago    Past Surgical History:  Procedure Laterality Date   ABDOMINAL HYSTERECTOMY     APPENDECTOMY     BLEPHAROPLASTY Bilateral    BREAST CYST ASPIRATION Left    neg   CARDIAC CATHETERIZATION     CATARACT EXTRACTION W/PHACO Left 07/28/2020   Procedure: CATARACT EXTRACTION PHACO AND INTRAOCULAR LENS PLACEMENT (IOC) LEFT PANOPTIX TORIC 4.25 00:30.9;  Surgeon: Eulogio Bear, MD;  Location: [facility];  Service: Ophthalmology;  Laterality: Left;   CATARACT EXTRACTION W/PHACO Right 08/18/2020   Procedure: CATARACT EXTRACTION PHACO AND INTRAOCULAR LENS PLACEMENT (IOC) RIGHT PANOPTIX TORIC 2.31 00:20.0;  Surgeon: Eulogio Bear, MD;  Location: [facility];  Service:  Ophthalmology;  Laterality: Right;   COLONOSCOPY     KNEE SURGERY Left    2 meniscus tears   NECK SURGERY     cosmetic    There were no vitals filed for this visit.   Subjective Assessment - 07/06/21 1004     Subjective  My thumb and hand are doing great - no pain -I have done what you told me and feels great - I can use it now without pain - and it use to hurt to bad when I hit it    Pertinent History Ortho consult 06/12/21 with DR Roland Rack - She is having  intermittent stabbing pain to the thenar eminence of her right hand. These symptoms are aggravated by grasping or holding objects. In fact, she notes that she will occasionally have to let go of a pot or pan due to the pain. She denies any numbness or paresthesias to her hand, denies any specific injury to the area. However, she does recall an incident about 4 years ago which she underwent a cardiac catheterization utilizing the right radial artery as an access point, and wonders if this may have played a role in her present symptoms. Prior to leaving the facility, the pressure dressing was removed and she was noted to have increased subcutaneous bleeding/leakage from the radial artery,  so they had to re-apply pressure for a period of time. She also notes a painful focal area of subcutaneous fullness in the mid forearm region.Finally, regarding her right forearm pain, she will be referred to occupational therapy    Patient Stated Goals Wants the pain better in my thumb and forearm so I can do things around the house    Currently in Pain? No/denies                [facility] OT Assessment - 07/06/21 0001       AROM   Right Wrist Extension 70 Degrees    Right Wrist Flexion 90 Degrees    Right Wrist Radial Deviation 32 Degrees    Right Wrist Ulnar Deviation 35 Degrees      Strength   Right Hand Grip (lbs) 50    Right Hand Lateral Pinch 18 lbs    Right Hand 3 Point Pinch 14 lbs    Left Hand Grip (lbs) 50    Left Hand Lateral Pinch 16 lbs     Left Hand 3 Point Pinch 16 lbs      Right Hand AROM   R Thumb Opposition to Index --   Opposition to [facility] - no pain            measurements taken for AROM in bilateral wrist and hands  WNL and no pain -  Thumb PA and RA WNL - 5/5 strength and no pain Opposition to [facility] no pain and 5/5 strength to 5th Wrist strength in all planes 5/5 - no pain   Grip and prehension - no pain and above her range for age Pt discharge with HEP to maintain her motion and strength -     Cont as needed with joint protection principles = to use larger joint and built up handles to prevent pain with thumb with use                 OT Education - 07/06/21 1007     Education Details progress and discharge instructions    Person(s) Educated Patient    Methods Explanation;Demonstration;Tactile cues;Verbal cues;Handout    Comprehension Verbal cues required;Returned demonstration;Verbalized understanding                 OT Long Term Goals - 07/06/21 1011       OT LONG TERM GOAL #1   Title Pt to be independent in HEP to decrease pain less than 2/10 in thumb and volar wrist less than 3 days week    Baseline Pt has pain daily and with gripping , cooking , cleaning , laundry pain 1-10/10 -  but 5/10 with ROM - 10/10 when pushing or hitting thumb  - no pain and AROM /strength WNL    Status Achieved      OT LONG TERM GOAL #2   Title Pt report pain in thumb and wrist less than 2/10 with  vacuum, cooking, cleaning and laundry    Baseline pain 1-5/10 with use - when hitting thumb against something 10/10 - tender over thenar eminence -pain with thumb flexion to [facility] - NOW AROM and use WNL and no pain    Status Achieved                   Plan - 07/06/21 1007     Clinical Impression Statement Pt refer to OT with R thumb thenar and forearm pain - with gripping activities.  AT Eval  pt was tender  over volar thenar eminence at [facility] but negative for grinding test  and thumb PA and RA.   Pain with end rang wrist RD  and extention. Pain with thumb flexion to [facility] and opposition and resistance- with pull in volar wrist. ? OA of [facility] or tendinitis of FPB. Pt was educated on joint protection and modifiations of tasks to use larger joints or enlarge grips.  Pt return for follow up with reports of no pain with use, great strength and normal use of R dominant hand in ADL;s and iADL's - pt to cont with HEP , use paraffin if needed and cont with joint protection - her grip and prehension strength isabove range for her age and no pain. Pt  met all goals and is discharge from OT services.    OT Occupational Profile and History Problem Focused Assessment - Including review of records relating to presenting problem    Occupational performance deficits (Please refer to evaluation for details): ADL's;IADL's;Play;Leisure    Body Structure / Function / Physical Skills ADL;Pain;UE functional use    Rehab Potential Fair    Clinical Decision Making Limited treatment options, no task modification necessary    Comorbidities Affecting Occupational Performance: None    Modification or Assistance to Complete Evaluation  No modification of tasks or assist necessary to complete eval    OT Treatment/Interventions Paraffin;Manual Therapy;Patient/family education;Therapeutic exercise;DME and/or AE instruction    Consulted and Agree with Plan of Care Patient             Patient will benefit from skilled therapeutic intervention in order to improve the following deficits and impairments:   Body Structure / Function / Physical Skills: ADL, Pain, UE functional use       Visit Diagnosis: Pain in right hand  Stiffness of right hand, not elsewhere classified    Problem List Patient Active Problem List   Diagnosis Date Noted   Lymphedema 03/09/2020   Stage 3a chronic kidney disease ([place]) 12/04/2019   MGUS (monoclonal gammopathy of unknown significance) 01/23/2019   Osteoporosis 11/06/2018   PAT  (paroxysmal atrial tachycardia) ([place]) 03/23/2018   Osteopenia with high risk of fracture 11/14/2017   History of gastric ulcer 11/03/2017   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, [place], [address] Phone: [phone]   Fax:  [phone]  Name: Bonnie Crawford MRN: [id number] Date of Birth: 1940/12/14

## 2021-07-24 DIAGNOSIS — M79631 Pain in right forearm: Secondary | ICD-10-CM | POA: Diagnosis not present

## 2021-07-24 DIAGNOSIS — D2120 Benign neoplasm of connective and other soft tissue of unspecified lower limb, including hip: Secondary | ICD-10-CM | POA: Diagnosis not present

## 2021-07-24 DIAGNOSIS — M7581 Other shoulder lesions, right shoulder: Secondary | ICD-10-CM | POA: Diagnosis not present

## 2021-07-24 DIAGNOSIS — M19011 Primary osteoarthritis, right shoulder: Secondary | ICD-10-CM | POA: Diagnosis not present

## 2021-07-24 DIAGNOSIS — R600 Localized edema: Secondary | ICD-10-CM | POA: Diagnosis not present

## 2021-07-30 DIAGNOSIS — D2271 Melanocytic nevi of right lower limb, including hip: Secondary | ICD-10-CM | POA: Diagnosis not present

## 2021-07-30 DIAGNOSIS — D2272 Melanocytic nevi of left lower limb, including hip: Secondary | ICD-10-CM | POA: Diagnosis not present

## 2021-07-30 DIAGNOSIS — D2262 Melanocytic nevi of left upper limb, including shoulder: Secondary | ICD-10-CM | POA: Diagnosis not present

## 2021-07-30 DIAGNOSIS — L821 Other seborrheic keratosis: Secondary | ICD-10-CM | POA: Diagnosis not present

## 2021-07-30 DIAGNOSIS — D485 Neoplasm of uncertain behavior of skin: Secondary | ICD-10-CM | POA: Diagnosis not present

## 2021-07-30 DIAGNOSIS — L820 Inflamed seborrheic keratosis: Secondary | ICD-10-CM | POA: Diagnosis not present

## 2021-07-30 DIAGNOSIS — D2261 Melanocytic nevi of right upper limb, including shoulder: Secondary | ICD-10-CM | POA: Diagnosis not present

## 2021-07-30 DIAGNOSIS — D225 Melanocytic nevi of trunk: Secondary | ICD-10-CM | POA: Diagnosis not present

## 2021-08-31 ENCOUNTER — Other Ambulatory Visit: Payer: Self-pay | Admitting: Internal Medicine

## 2021-08-31 DIAGNOSIS — Z79899 Other long term (current) drug therapy: Secondary | ICD-10-CM | POA: Diagnosis not present

## 2021-08-31 DIAGNOSIS — E785 Hyperlipidemia, unspecified: Secondary | ICD-10-CM | POA: Diagnosis not present

## 2021-08-31 DIAGNOSIS — R600 Localized edema: Secondary | ICD-10-CM | POA: Diagnosis not present

## 2021-08-31 DIAGNOSIS — Z1231 Encounter for screening mammogram for malignant neoplasm of breast: Secondary | ICD-10-CM

## 2021-08-31 DIAGNOSIS — I471 Supraventricular tachycardia: Secondary | ICD-10-CM | POA: Diagnosis not present

## 2021-08-31 DIAGNOSIS — N1831 Chronic kidney disease, stage 3a: Secondary | ICD-10-CM | POA: Diagnosis not present

## 2021-08-31 DIAGNOSIS — Z87891 Personal history of nicotine dependence: Secondary | ICD-10-CM | POA: Diagnosis not present

## 2021-08-31 DIAGNOSIS — I129 Hypertensive chronic kidney disease with stage 1 through stage 4 chronic kidney disease, or unspecified chronic kidney disease: Secondary | ICD-10-CM | POA: Diagnosis not present

## 2021-09-14 DIAGNOSIS — Z79899 Other long term (current) drug therapy: Secondary | ICD-10-CM | POA: Diagnosis not present

## 2021-09-14 DIAGNOSIS — E785 Hyperlipidemia, unspecified: Secondary | ICD-10-CM | POA: Diagnosis not present

## 2021-09-14 DIAGNOSIS — I10 Essential (primary) hypertension: Secondary | ICD-10-CM | POA: Diagnosis not present

## 2021-09-27 NOTE — Progress Notes (Signed)
MRN : [id number]  Bonnie Crawford is a 81 y.o. (February 12, 1941) female who presents with chief complaint of legs swell.  History of Present Illness:   Patient is seen for evaluation of leg pain and leg swelling. The patient first noticed the swelling remotely. The swelling is associated with pain and discoloration. The pain and swelling worsens with prolonged dependency and improves with elevation. The pain is unrelated to activity.  The patient notes that in the morning the legs are improved but they steadily worsened throughout the course of the day. The patient also notes a steady worsening of the discoloration in the ankle and shin area.   The patient denies claudication symptoms.  The patient denies symptoms consistent with rest pain.  The patient denies and extensive history of DJD and LS spine disease.  The patient has no had any past angiography, interventions or vascular surgery.  Elevation makes the leg symptoms better, dependency makes them much worse. There is no history of ulcerations. The patient denies any recent changes in medications.  The patient has been wearing graduated compression.  The patient denies a history of PE. There is no prior history of phlebitis.  + history of malignancies with Hx of MGUS. No history of trauma or groin or pelvic surgery. There is no history of radiation treatment to the groin or pelvis  The patient denies amaurosis fugax or recent TIA symptoms. There are no recent neurological changes noted. The patient denies recent episodes of angina or shortness of breath   No outpatient medications have been marked as taking for the 09/28/21 encounter (Appointment) with Delana Meyer, Dolores Lory, MD.    Past Medical History:  Diagnosis Date   Anxiety    Arthritis    Asthma    childhood   Chronic kidney disease    Renal Insufficiency   Colon polyp    Diverticulosis    Factor V Leiden ([place])    GERD (gastroesophageal reflux disease)    Herpes  zoster    History of kidney stones    Hyperlipidemia    Hypertension    Inflammatory bowel disease    MGUS (monoclonal gammopathy of unknown significance)    Osteoporosis    PAT (paroxysmal atrial tachycardia) (HCC)    Sinus trouble    URI (upper respiratory infection)    2 months ago    Past Surgical History:  Procedure Laterality Date   ABDOMINAL HYSTERECTOMY     APPENDECTOMY     BLEPHAROPLASTY Bilateral    BREAST CYST ASPIRATION Left    neg   CARDIAC CATHETERIZATION     CATARACT EXTRACTION W/PHACO Left 07/28/2020   Procedure: CATARACT EXTRACTION PHACO AND INTRAOCULAR LENS PLACEMENT (IOC) LEFT PANOPTIX TORIC 4.25 00:30.9;  Surgeon: Eulogio Bear, MD;  Location: [place] [facility];  Service: Ophthalmology;  Laterality: Left;   CATARACT EXTRACTION W/PHACO Right 08/18/2020   Procedure: CATARACT EXTRACTION PHACO AND INTRAOCULAR LENS PLACEMENT (IOC) RIGHT PANOPTIX TORIC 2.31 00:20.0;  Surgeon: Eulogio Bear, MD;  Location: [place];  Service: Ophthalmology;  Laterality: Right;   COLONOSCOPY     KNEE SURGERY Left    2 meniscus tears   NECK SURGERY     cosmetic    Social History Social History   Tobacco Use   Smoking status: Former    Types: Cigarettes    Quit date: 1997    Years since quitting: 26.3   Smokeless tobacco: Never  Vaping Use   Vaping Use: Never used  Substance Use Topics   Drug use: Never    Family History Family History  Problem Relation Age of Onset   Lung cancer Sister    Breast cancer Sister 14   Factor V Leiden deficiency Brother    Lung cancer Brother    Kidney cancer Brother    Factor V Leiden deficiency Brother    Factor V Leiden deficiency Brother    Stomach cancer Brother    Vasculitis Sister     Allergies  Allergen Reactions   Metoprolol Succinate  [Metoprolol Tartrate] Other (See Comments)    headache   Other Rash    "Dexatone"-kidney medication   Dexamethasone Rash     REVIEW OF SYSTEMS (Negative unless  checked)  Constitutional: '[]'$ Weight loss  '[]'$ Fever  '[]'$ Chills Cardiac: '[]'$ Chest pain   '[]'$ Chest pressure   '[]'$ Palpitations   '[]'$ Shortness of breath when laying flat   '[]'$ Shortness of breath with exertion. Vascular:  '[]'$ Pain in legs with walking   '[x]'$ Pain in legs with standing  '[]'$ History of DVT   '[]'$ Phlebitis   '[x]'$ Swelling in legs   '[]'$ Varicose veins   '[]'$ Non-healing ulcers Pulmonary:   '[]'$ Uses home oxygen   '[]'$ Productive cough   '[]'$ Hemoptysis   '[]'$ Wheeze  '[]'$ COPD   '[]'$ Asthma Neurologic:  '[]'$ Dizziness   '[]'$ Seizures   '[]'$ History of stroke   '[]'$ History of TIA  '[]'$ Aphasia   '[]'$ Vissual changes   '[]'$ Weakness or numbness in arm   '[]'$ Weakness or numbness in leg Musculoskeletal:   '[]'$ Joint swelling   '[]'$ Joint pain   '[]'$ Low back pain Hematologic:  '[]'$ Easy bruising  '[]'$ Easy bleeding   '[x]'$ Hypercoagulable state   '[]'$ Anemic Gastrointestinal:  '[]'$ Diarrhea   '[]'$ Vomiting  '[x]'$ Gastroesophageal reflux/heartburn   '[]'$ Difficulty swallowing. Genitourinary:  '[]'$ Chronic kidney disease   '[]'$ Difficult urination  '[]'$ Frequent urination   '[]'$ Blood in urine Skin:  '[]'$ Rashes   '[]'$ Ulcers  Psychological:  '[]'$ History of anxiety   '[]'$  History of major depression.  Physical Examination  There were no vitals filed for this visit. There is no height or weight on file to calculate BMI. Gen: WD/WN, NAD Head: [place]/AT, No temporalis wasting.  Ear/Nose/Throat: Hearing grossly intact, nares w/o erythema or drainage, pinna without lesions Eyes: PER, EOMI, sclera nonicteric.  Neck: Supple, no gross masses.  No JVD.  Pulmonary:  Good air movement, no audible wheezing, no use of accessory muscles.  Cardiac: RRR, precordium not hyperdynamic. Vascular:  scattered varicosities present bilaterally.  Moderate venous stasis changes to the legs bilaterally.  3-4+ soft pitting edema  Vessel Right Left  Radial Palpable Palpable  Gastrointestinal: soft, non-distended. No guarding/no peritoneal signs.  Musculoskeletal: M/S 5/5 throughout.  No deformity.  Neurologic: CN 2-12 intact. Pain  and light touch intact in extremities.  Symmetrical.  Speech is fluent. Motor exam as listed above. Psychiatric: Judgment intact, Mood & affect appropriate for pt's clinical situation. Dermatologic: Venous rashes no ulcers noted.  No changes consistent with cellulitis. Lymph : No lichenification or skin changes of chronic lymphedema.  CBC No results found for: WBC, HGB, HCT, MCV, PLT  BMET No results found for: NA, K, CL, CO2, GLUCOSE, BUN, CREATININE, CALCIUM, GFRNONAA, GFRAA CrCl cannot be calculated (No successful lab value found.).  COAG No results found for: INR, PROTIME  Radiology No results found.   Assessment/Plan 1. Lymphedema Recommend:  No surgery or intervention at this point in time.    I have reviewed my discussion with the patient regarding lymphedema and why it  causes symptoms.  Patient will continue wearing graduated compression on a daily basis.  In particular we discussed Compression Wraps.  The patient should put the compression on first thing in the morning and removing them in the evening. The patient should not sleep in the compression.   In addition, behavioral modification throughout the day will be continued.  This will include frequent elevation (such as in a recliner), use of over the counter pain medications as needed and exercise such as walking.  The systemic causes for chronic edema such as liver, kidney and cardiac etiologies does not appear to have significant changed over the past year.    The patient will continue aggressive use of the  lymph pump.  We discussed increasing the usage.  This will continue to improve the edema control and prevent sequela such as ulcers and infections.   The patient will follow-up with me on a as needed basis.     A total of 45 minutes was spent with this patient and greater than 50% was spent in counseling and coordination of care with the patient.  Discussion included the treatment options for vascular disease  including indications for surgery and intervention.  Also discussed is the appropriate timing of treatment.  In addition medical therapy was discussed.   2. Atherosclerosis of native coronary artery of native heart with stable angina pectoris ([place]) Continue cardiac and antihypertensive medications as already ordered and reviewed, no changes at this time.  Continue statin as ordered and reviewed, no changes at this time  Nitrates PRN for chest pain   3. Essential hypertension Continue antihypertensive medications as already ordered, these medications have been reviewed and there are no changes at this time.   4. Hyperlipidemia LDL goal <70 Continue statin as ordered and reviewed, no changes at this time     Hortencia Pilar, MD  09/27/2021 12:55 PM

## 2021-09-28 ENCOUNTER — Encounter (INDEPENDENT_AMBULATORY_CARE_PROVIDER_SITE_OTHER): Payer: Self-pay | Admitting: Vascular Surgery

## 2021-09-28 ENCOUNTER — Ambulatory Visit (INDEPENDENT_AMBULATORY_CARE_PROVIDER_SITE_OTHER): Payer: Medicare HMO | Admitting: Vascular Surgery

## 2021-09-28 VITALS — BP 135/89 | HR 60 | Resp 16 | Wt 167.0 lb

## 2021-09-28 DIAGNOSIS — E785 Hyperlipidemia, unspecified: Secondary | ICD-10-CM

## 2021-09-28 DIAGNOSIS — I890 Lymphedema, not elsewhere classified: Secondary | ICD-10-CM

## 2021-09-28 DIAGNOSIS — I25118 Atherosclerotic heart disease of native coronary artery with other forms of angina pectoris: Secondary | ICD-10-CM

## 2021-09-28 DIAGNOSIS — I10 Essential (primary) hypertension: Secondary | ICD-10-CM | POA: Diagnosis not present

## 2021-10-04 ENCOUNTER — Encounter (INDEPENDENT_AMBULATORY_CARE_PROVIDER_SITE_OTHER): Payer: Self-pay | Admitting: Vascular Surgery

## 2021-10-08 DIAGNOSIS — H43813 Vitreous degeneration, bilateral: Secondary | ICD-10-CM | POA: Diagnosis not present

## 2021-11-04 DIAGNOSIS — I493 Ventricular premature depolarization: Secondary | ICD-10-CM | POA: Diagnosis not present

## 2021-11-04 DIAGNOSIS — I2510 Atherosclerotic heart disease of native coronary artery without angina pectoris: Secondary | ICD-10-CM | POA: Diagnosis not present

## 2021-11-04 DIAGNOSIS — E785 Hyperlipidemia, unspecified: Secondary | ICD-10-CM | POA: Diagnosis not present

## 2021-11-04 DIAGNOSIS — I10 Essential (primary) hypertension: Secondary | ICD-10-CM | POA: Diagnosis not present

## 2021-11-04 DIAGNOSIS — I471 Supraventricular tachycardia: Secondary | ICD-10-CM | POA: Diagnosis not present

## 2021-11-04 DIAGNOSIS — I890 Lymphedema, not elsewhere classified: Secondary | ICD-10-CM | POA: Diagnosis not present

## 2021-12-01 DIAGNOSIS — E785 Hyperlipidemia, unspecified: Secondary | ICD-10-CM | POA: Diagnosis not present

## 2021-12-01 DIAGNOSIS — I129 Hypertensive chronic kidney disease with stage 1 through stage 4 chronic kidney disease, or unspecified chronic kidney disease: Secondary | ICD-10-CM | POA: Diagnosis not present

## 2021-12-01 DIAGNOSIS — R002 Palpitations: Secondary | ICD-10-CM | POA: Diagnosis not present

## 2021-12-01 DIAGNOSIS — I471 Supraventricular tachycardia: Secondary | ICD-10-CM | POA: Diagnosis not present

## 2021-12-01 DIAGNOSIS — Z87891 Personal history of nicotine dependence: Secondary | ICD-10-CM | POA: Diagnosis not present

## 2021-12-01 DIAGNOSIS — Z79899 Other long term (current) drug therapy: Secondary | ICD-10-CM | POA: Diagnosis not present

## 2021-12-01 DIAGNOSIS — N1831 Chronic kidney disease, stage 3a: Secondary | ICD-10-CM | POA: Diagnosis not present

## 2021-12-01 DIAGNOSIS — R0609 Other forms of dyspnea: Secondary | ICD-10-CM | POA: Diagnosis not present

## 2021-12-01 DIAGNOSIS — I2510 Atherosclerotic heart disease of native coronary artery without angina pectoris: Secondary | ICD-10-CM | POA: Diagnosis not present

## 2021-12-02 ENCOUNTER — Ambulatory Visit
Admission: RE | Admit: 2021-12-02 | Discharge: 2021-12-02 | Disposition: A | Discharge status: Home / Self Care | Payer: Medicare HMO | Source: Ambulatory Visit | Attending: Internal Medicine | Admitting: Internal Medicine

## 2021-12-02 DIAGNOSIS — Z1231 Encounter for screening mammogram for malignant neoplasm of breast: Secondary | ICD-10-CM | POA: Diagnosis not present

## 2021-12-23 DIAGNOSIS — R0609 Other forms of dyspnea: Secondary | ICD-10-CM | POA: Diagnosis not present

## 2021-12-23 DIAGNOSIS — I5189 Other ill-defined heart diseases: Secondary | ICD-10-CM

## 2021-12-23 DIAGNOSIS — R002 Palpitations: Secondary | ICD-10-CM | POA: Diagnosis not present

## 2021-12-23 HISTORY — DX: Other ill-defined heart diseases: I51.89

## 2022-01-04 DIAGNOSIS — R944 Abnormal results of kidney function studies: Secondary | ICD-10-CM | POA: Diagnosis not present

## 2022-01-26 DIAGNOSIS — I10 Essential (primary) hypertension: Secondary | ICD-10-CM | POA: Diagnosis not present

## 2022-01-26 DIAGNOSIS — N1831 Chronic kidney disease, stage 3a: Secondary | ICD-10-CM | POA: Diagnosis not present

## 2022-01-27 ENCOUNTER — Other Ambulatory Visit: Payer: Self-pay | Admitting: Nephrology

## 2022-01-27 DIAGNOSIS — N1831 Chronic kidney disease, stage 3a: Secondary | ICD-10-CM

## 2022-02-01 ENCOUNTER — Encounter (HOSPITAL_COMMUNITY): Payer: Medicare HMO

## 2022-02-01 ENCOUNTER — Ambulatory Visit
Admission: RE | Admit: 2022-02-01 | Discharge: 2022-02-01 | Disposition: A | Discharge status: Home / Self Care | Payer: Medicare HMO | Source: Ambulatory Visit | Attending: Nephrology | Admitting: Nephrology

## 2022-02-01 ENCOUNTER — Encounter: Payer: Medicare HMO | Admitting: Surgery

## 2022-02-01 DIAGNOSIS — N189 Chronic kidney disease, unspecified: Secondary | ICD-10-CM | POA: Diagnosis not present

## 2022-02-01 DIAGNOSIS — N1831 Chronic kidney disease, stage 3a: Secondary | ICD-10-CM | POA: Diagnosis not present

## 2022-02-01 DIAGNOSIS — N281 Cyst of kidney, acquired: Secondary | ICD-10-CM | POA: Diagnosis not present

## 2022-02-11 ENCOUNTER — Other Ambulatory Visit: Payer: Self-pay

## 2022-02-11 DIAGNOSIS — I890 Lymphedema, not elsewhere classified: Secondary | ICD-10-CM

## 2022-02-14 DIAGNOSIS — I4891 Unspecified atrial fibrillation: Secondary | ICD-10-CM

## 2022-02-14 HISTORY — DX: Unspecified atrial fibrillation: I48.91

## 2022-02-16 ENCOUNTER — Ambulatory Visit (HOSPITAL_COMMUNITY)
Admission: RE | Admit: 2022-02-16 | Discharge: 2022-02-16 | Disposition: A | Discharge status: Home / Self Care | Payer: Medicare HMO | Source: Ambulatory Visit | Attending: Cardiology | Admitting: Cardiology

## 2022-02-16 ENCOUNTER — Other Ambulatory Visit: Payer: Self-pay

## 2022-02-16 DIAGNOSIS — R2242 Localized swelling, mass and lump, left lower limb: Secondary | ICD-10-CM | POA: Diagnosis not present

## 2022-02-16 DIAGNOSIS — I890 Lymphedema, not elsewhere classified: Secondary | ICD-10-CM | POA: Diagnosis not present

## 2022-02-16 NOTE — Progress Notes (Signed)
Error

## 2022-02-17 ENCOUNTER — Encounter: Payer: Self-pay | Admitting: Vascular Surgery

## 2022-02-17 ENCOUNTER — Ambulatory Visit: Payer: Medicare HMO | Admitting: Vascular Surgery

## 2022-02-17 VITALS — BP 139/66 | HR 50 | Temp 98.1°F | Resp 18 | Ht 62.5 in | Wt 168.7 lb

## 2022-02-17 DIAGNOSIS — I890 Lymphedema, not elsewhere classified: Secondary | ICD-10-CM

## 2022-02-17 NOTE — Progress Notes (Signed)
ASSESSMENT & PLAN   LYMPHEDEMA: This patient has a history of idiopathic lymphedema.  We have discussed the importance of intermittent leg elevation and the proper positioning for this.  I encouraged her to get refitted for compression stockings as the one she has currently or not working well.  She does state that she has a pair at home that fit better and she will start using those.  I have encouraged her to avoid prolonged sitting and standing.  We have discussed the importance of exercise specifically walking and water aerobics.  We also discussed the importance of maintaining a healthy weight as central obesity especially increases lower extremity venous pressure.  I have encouraged her to use her lymphedema pump daily.  I reassured her that her venous study of the left lower extremity did not show any significant venous disease that would be contributing to her swelling.  I will be happy to see her back at any time if her swelling worsens.  REASON FOR CONSULT:    Lymphedema. Referred by Dr. Fulton Reek.   HPI:   Bonnie Crawford is a 81 y.o. female who is referred with leg swelling.  I have reviewed the records from the referring office.  Patient also has a history of essential hypertension, paroxysmal atrial tachycardia, hyper lipidemia, multinodular goiter, factor V Leiden mutation, and stage III chronic kidney disease.  On my history the patient developed a gradual onset of swelling in both lower legs when she was in her 51s and 30s.  This has been relatively stable although it seems to have progressed some recently.  She has been previously evaluated and diagnosed with lymphedema.  She has been wearing some knee-high stockings although these do not fit well.  She also has a bio tab lymphedema pump which she uses 3 times a week.  She denies any previous history of DVT.  She has undergone previous laser ablation of both great saphenous veins elsewhere.  I do not get any history of  claudication or rest pain.  Past Medical History:  Diagnosis Date   Anxiety    Arthritis    Asthma    childhood   Chronic kidney disease    Renal Insufficiency   Colon polyp    Diverticulosis    Factor V Leiden (HCC)    GERD (gastroesophageal reflux disease)    Herpes zoster    History of kidney stones    Hyperlipidemia    Hypertension    Inflammatory bowel disease    MGUS (monoclonal gammopathy of unknown significance)    Osteoporosis    PAT (paroxysmal atrial tachycardia)    Sinus trouble    URI (upper respiratory infection)    2 months ago    Family History  Problem Relation Age of Onset   Lung cancer Sister    Breast cancer Sister 57   Factor V Leiden deficiency Brother    Lung cancer Brother    Kidney cancer Brother    Factor V Leiden deficiency Brother    Factor V Leiden deficiency Brother    Stomach cancer Brother    Vasculitis Sister     SOCIAL HISTORY: Social History   Tobacco Use   Smoking status: Former    Types: Cigarettes    Quit date: 1997    Years since quitting: 26.7   Smokeless tobacco: Never  Substance Use Topics   Alcohol use: Not on file    Comment: once a year    Allergies  Allergen Reactions  Metoprolol Succinate  [Metoprolol Tartrate] Other (See Comments)    headache   Other Rash    "Dexatone"-kidney medication   Dexamethasone Rash    Current Outpatient Medications  Medication Sig Dispense Refill   acetaminophen (TYLENOL) 500 MG tablet Take 500 mg by mouth every 6 (six) hours as needed.      aspirin EC 81 MG tablet Take 81 mg by mouth daily.     atenolol (TENORMIN) 25 MG tablet Take by mouth.     atorvastatin (LIPITOR) 20 MG tablet Take 20 mg by mouth daily.     Calcium Citrate-Vitamin D 315-250 MG-UNIT TABS Take by mouth.     carboxymethylcellulose (REFRESH PLUS) 0.5 % SOLN 1 drop 2 (two) times daily as needed.     cetirizine (ZYRTEC) 5 MG tablet Take 5 mg by mouth daily.     diphenhydrAMINE (BENADRYL) 25 MG tablet Take  25 mg by mouth every 6 (six) hours as needed.      neomycin-polymyxin-hydrocortisone (CORTISPORIN) OTIC solution Place 4 drops into both ears at bedtime.     Omega-3 Fatty Acids (OMEGA-3 PLUS PO) Take by mouth 3 (three) times a week.     omeprazole (PRILOSEC) 20 MG capsule Take 20 mg by mouth daily as needed.      psyllium (METAMUCIL) 58.6 % powder Take 1 packet by mouth daily as needed.      Simethicone (GAS-X PO) Take by mouth as needed.     Ubiquinol 100 MG CAPS Take 100 mg by mouth daily.      carbamide peroxide (DEBROX) 6.5 % OTIC solution 5 drops as needed.     Cholecalciferol (VITAMIN D-1000 MAX ST) 25 MCG (1000 UT) tablet Take by mouth.     COLLAGEN PO Take by mouth daily.     conjugated estrogens (PREMARIN) vaginal cream 0.5g vaginally twice weekly     dextromethorphan-guaiFENesin (MUCINEX DM) 30-600 MG 12hr tablet Take 1 tablet by mouth 2 (two) times daily.     hydrochlorothiazide (HYDRODIURIL) 25 MG tablet 3 (three) times a week. M, W, F (Patient not taking: Reported on 02/17/2022)     Lifitegrast (XIIDRA) 5 % SOLN Apply to eye.     Magnesium 500 MG TABS Take by mouth 3 (three) times a week. (Patient not taking: Reported on 09/28/2021)     mirabegron ER (MYRBETRIQ) 50 MG TB24 tablet Take 1 tablet (50 mg total) by mouth daily. (Patient taking differently: Take 50 mg by mouth daily. Tues, Thurs, Sat) 90 tablet 3   Multiple Vitamin (MULTIVITAMIN) tablet Take by mouth. (Patient not taking: Reported on 09/28/2021)     Nutritional Supplements (KETO PO) Take by mouth in the morning and at bedtime. Keto Strong and Keto Strong Detox     predniSONE (STERAPRED UNI-PAK 21 TAB) 10 MG (21) TBPK tablet Take 6 tablets on the first day and decrease by 1 tablet each day until finished. (Patient not taking: Reported on 09/28/2021) 21 tablet 0   Turmeric 500 MG CAPS Take by mouth.     No current facility-administered medications for this visit.    REVIEW OF SYSTEMS:  '[X]'$  denotes positive finding, '[ ]'$   denotes negative finding Cardiac  Comments:  Chest pain or chest pressure:    Shortness of breath upon exertion:    Short of breath when lying flat:    Irregular heart rhythm: x       Vascular    Pain in calf, thigh, or hip brought on by ambulation:    Pain  in feet at night that wakes you up from your sleep:     Blood clot in your veins:    Leg swelling:  x       Pulmonary    Oxygen at home:    Productive cough:     Wheezing:         Neurologic    Sudden weakness in arms or legs:     Sudden numbness in arms or legs:     Sudden onset of difficulty speaking or slurred speech:    Temporary loss of vision in one eye:     Problems with dizziness:         Gastrointestinal    Blood in stool:     Vomited blood:         Genitourinary    Burning when urinating:     Blood in urine:        Psychiatric    Major depression:         Hematologic    Bleeding problems:    Problems with blood clotting too easily:        Skin    Rashes or ulcers:        Constitutional    Fever or chills:    -  PHYSICAL EXAM:   Vitals:   02/17/22 1005  BP: 139/66  Pulse: (!) 50  Resp: 18  Temp: 98.1 F (36.7 C)  TempSrc: Temporal  SpO2: 98%  Weight: 168 lb 11.2 oz (76.5 kg)  Height: 5' 2.5" (1.588 m)   Body mass index is 30.36 kg/m. GENERAL: The patient is a well-nourished female, in no acute distress. The vital signs are documented above. CARDIAC: There is a regular rate and rhythm.  VASCULAR: I do not detect carotid bruits. She has a brisk dorsalis pedis and posterior tibial signal bilaterally with the Doppler. She has bilateral lower extremity swelling which is more significant on the left side.  She has nonpitting edema consistent with lymphedema.   PULMONARY: There is good air exchange bilaterally without wheezing or rales. ABDOMEN: Soft and non-tender with normal pitched bowel sounds.  MUSCULOSKELETAL: There are no major deformities. NEUROLOGIC: No focal weakness or  paresthesias are detected. SKIN: There are no ulcers or rashes noted. PSYCHIATRIC: The patient has a normal affect.  DATA:    VENOUS DUPLEX: I have reviewed the venous duplex scan that was done yesterday.  This was of the left lower extremity.  There is no evidence of DVT.  There was no significant deep venous reflux.  There was no significant superficial venous reflux.  The left great saphenous vein had been previously ablated.  Deitra [facility]

## 2022-02-24 DIAGNOSIS — N1831 Chronic kidney disease, stage 3a: Secondary | ICD-10-CM | POA: Diagnosis not present

## 2022-02-24 DIAGNOSIS — I10 Essential (primary) hypertension: Secondary | ICD-10-CM | POA: Diagnosis not present

## 2022-03-03 DIAGNOSIS — N1831 Chronic kidney disease, stage 3a: Secondary | ICD-10-CM | POA: Diagnosis not present

## 2022-03-03 DIAGNOSIS — R600 Localized edema: Secondary | ICD-10-CM | POA: Diagnosis not present

## 2022-03-03 DIAGNOSIS — I10 Essential (primary) hypertension: Secondary | ICD-10-CM | POA: Diagnosis not present

## 2022-03-07 ENCOUNTER — Observation Stay: Payer: Medicare HMO

## 2022-03-07 ENCOUNTER — Other Ambulatory Visit: Payer: Self-pay

## 2022-03-07 ENCOUNTER — Emergency Department: Payer: Medicare HMO

## 2022-03-07 ENCOUNTER — Observation Stay
Admission: EM | Admit: 2022-03-07 | Discharge: 2022-03-08 | Disposition: A | Discharge status: Home / Self Care | Payer: Medicare HMO | Attending: Internal Medicine | Admitting: Internal Medicine

## 2022-03-07 ENCOUNTER — Encounter: Payer: Self-pay | Admitting: Internal Medicine

## 2022-03-07 DIAGNOSIS — E669 Obesity, unspecified: Secondary | ICD-10-CM | POA: Diagnosis present

## 2022-03-07 DIAGNOSIS — N1831 Chronic kidney disease, stage 3a: Secondary | ICD-10-CM | POA: Diagnosis not present

## 2022-03-07 DIAGNOSIS — I259 Chronic ischemic heart disease, unspecified: Secondary | ICD-10-CM | POA: Diagnosis not present

## 2022-03-07 DIAGNOSIS — I890 Lymphedema, not elsewhere classified: Secondary | ICD-10-CM | POA: Diagnosis not present

## 2022-03-07 DIAGNOSIS — E785 Hyperlipidemia, unspecified: Secondary | ICD-10-CM | POA: Diagnosis not present

## 2022-03-07 DIAGNOSIS — R197 Diarrhea, unspecified: Secondary | ICD-10-CM | POA: Diagnosis not present

## 2022-03-07 DIAGNOSIS — Z79899 Other long term (current) drug therapy: Secondary | ICD-10-CM | POA: Insufficient documentation

## 2022-03-07 DIAGNOSIS — R079 Chest pain, unspecified: Secondary | ICD-10-CM | POA: Diagnosis not present

## 2022-03-07 DIAGNOSIS — R0789 Other chest pain: Secondary | ICD-10-CM | POA: Diagnosis not present

## 2022-03-07 DIAGNOSIS — Z1152 Encounter for screening for COVID-19: Secondary | ICD-10-CM | POA: Insufficient documentation

## 2022-03-07 DIAGNOSIS — J45909 Unspecified asthma, uncomplicated: Secondary | ICD-10-CM | POA: Diagnosis not present

## 2022-03-07 DIAGNOSIS — Z86718 Personal history of other venous thrombosis and embolism: Secondary | ICD-10-CM | POA: Insufficient documentation

## 2022-03-07 DIAGNOSIS — I4891 Unspecified atrial fibrillation: Secondary | ICD-10-CM | POA: Diagnosis present

## 2022-03-07 DIAGNOSIS — Z87891 Personal history of nicotine dependence: Secondary | ICD-10-CM | POA: Insufficient documentation

## 2022-03-07 DIAGNOSIS — I2510 Atherosclerotic heart disease of native coronary artery without angina pectoris: Secondary | ICD-10-CM | POA: Diagnosis present

## 2022-03-07 DIAGNOSIS — Z7982 Long term (current) use of aspirin: Secondary | ICD-10-CM | POA: Insufficient documentation

## 2022-03-07 DIAGNOSIS — Z6830 Body mass index (BMI) 30.0-30.9, adult: Secondary | ICD-10-CM | POA: Insufficient documentation

## 2022-03-07 DIAGNOSIS — J4520 Mild intermittent asthma, uncomplicated: Secondary | ICD-10-CM

## 2022-03-07 DIAGNOSIS — I129 Hypertensive chronic kidney disease with stage 1 through stage 4 chronic kidney disease, or unspecified chronic kidney disease: Secondary | ICD-10-CM | POA: Insufficient documentation

## 2022-03-07 DIAGNOSIS — I10 Essential (primary) hypertension: Secondary | ICD-10-CM | POA: Diagnosis present

## 2022-03-07 LAB — TROPONIN I (HIGH SENSITIVITY)
Troponin I (High Sensitivity): 7 ng/L (ref <18)
Troponin I (High Sensitivity): 7 ng/L (ref <18)
Troponin I (High Sensitivity): 6 ng/L (ref <18)
Troponin I (High Sensitivity): 7 ng/L (ref <18)

## 2022-03-07 LAB — HEMOGLOBIN A1C
Hgb A1c MFr Bld: 5.7 % — ABNORMAL HIGH (ref 4.8–5.6)
Mean Plasma Glucose: 116.89 mg/dL

## 2022-03-07 LAB — HEPATIC FUNCTION PANEL
ALT: 22 U/L (ref 0–44)
AST: 28 U/L (ref 15–41)
Albumin: 4 g/dL (ref 3.5–5.0)
Alkaline Phosphatase: 59 U/L (ref 38–126)
Bilirubin, Direct: 0.1 mg/dL (ref 0.0–0.2)
Indirect Bilirubin: 0.9 mg/dL (ref 0.3–0.9)
Total Bilirubin: 1 mg/dL (ref 0.3–1.2)
Total Protein: 7.5 g/dL (ref 6.5–8.1)

## 2022-03-07 LAB — PROTIME-INR
INR: 1.2 (ref 0.8–1.2)
Prothrombin Time: 15.2 seconds (ref 11.4–15.2)

## 2022-03-07 LAB — APTT
aPTT: 26 seconds (ref 24–36)
aPTT: >200 seconds (ref 24–36)
aPTT: 191 seconds (ref 24–36)

## 2022-03-07 LAB — BASIC METABOLIC PANEL
Anion gap: 5 (ref 5–15)
BUN: 14 mg/dL (ref 8–23)
CO2: 25 mmol/L (ref 22–32)
Calcium: 9.6 mg/dL (ref 8.9–10.3)
Chloride: 110 mmol/L (ref 98–111)
Creatinine, Ser: 1.1 mg/dL — ABNORMAL HIGH (ref 0.44–1.00)
GFR, Estimated: 51 mL/min — ABNORMAL LOW (ref >60)
Glucose, Bld: 110 mg/dL — ABNORMAL HIGH (ref 70–99)
Potassium: 4.3 mmol/L (ref 3.5–5.1)
Sodium: 140 mmol/L (ref 135–145)

## 2022-03-07 LAB — CBC
HCT: 51.4 % — ABNORMAL HIGH (ref 36.0–46.0)
Hemoglobin: 16.4 g/dL — ABNORMAL HIGH (ref 12.0–15.0)
MCH: 28.7 pg (ref 26.0–34.0)
MCHC: 31.9 g/dL (ref 30.0–36.0)
MCV: 89.9 fL (ref 80.0–100.0)
Platelets: 349 10*3/uL (ref 150–400)
RBC: 5.72 MIL/uL — ABNORMAL HIGH (ref 3.87–5.11)
RDW: 13.2 % (ref 11.5–15.5)
WBC: 8.1 10*3/uL (ref 4.0–10.5)
nRBC: 0 % (ref 0.0–0.2)

## 2022-03-07 LAB — TSH: TSH: 3.758 u[IU]/mL (ref 0.350–4.500)

## 2022-03-07 LAB — SARS CORONAVIRUS 2 BY RT PCR: SARS Coronavirus 2 by RT PCR: NEGATIVE

## 2022-03-07 LAB — HEPARIN LEVEL (UNFRACTIONATED): Heparin Unfractionated: 1.07 IU/mL — ABNORMAL HIGH (ref 0.30–0.70)

## 2022-03-07 MED ORDER — OYSTER SHELL CALCIUM/D3 500-5 MG-MCG PO TABS
1.0000 | ORAL_TABLET | Freq: Every day | ORAL | Status: DC
  Administered 2022-03-07 – 2022-03-08 (×2): 1 via ORAL
  Filled 2022-03-07 (×2): qty 1

## 2022-03-07 MED ORDER — HEPARIN (PORCINE) 25000 UT/250ML-% IV SOLN
900.0000 [IU]/h | INTRAVENOUS | Status: DC
  Administered 2022-03-07: 1100 [IU]/h via INTRAVENOUS
  Filled 2022-03-07: qty 250

## 2022-03-07 MED ORDER — ALBUTEROL SULFATE (2.5 MG/3ML) 0.083% IN NEBU: 3.0000 mL | INHALATION_SOLUTION | RESPIRATORY_TRACT | Status: DC | PRN

## 2022-03-07 MED ORDER — AMIODARONE HCL 200 MG PO TABS: 200.0000 mg | ORAL_TABLET | Freq: Two times a day (BID) | ORAL | Status: DC

## 2022-03-07 MED ORDER — IOHEXOL 350 MG/ML SOLN
75.0000 mL | Freq: Once | INTRAVENOUS | Status: AC | PRN
  Administered 2022-03-07: 75 mL via INTRAVENOUS

## 2022-03-07 MED ORDER — SODIUM CHLORIDE 0.9 % IV SOLN: Freq: Once | INTRAVENOUS | Status: AC

## 2022-03-07 MED ORDER — HYDRALAZINE HCL 20 MG/ML IJ SOLN: 5.0000 mg | INTRAMUSCULAR | Status: DC | PRN

## 2022-03-07 MED ORDER — ACETAMINOPHEN 325 MG PO TABS: 650.0000 mg | ORAL_TABLET | Freq: Four times a day (QID) | ORAL | Status: DC | PRN

## 2022-03-07 MED ORDER — MORPHINE SULFATE (PF) 2 MG/ML IV SOLN: 0.5000 mg | INTRAVENOUS | Status: DC | PRN

## 2022-03-07 MED ORDER — ASPIRIN 81 MG PO CHEW
81.0000 mg | CHEWABLE_TABLET | Freq: Every day | ORAL | Status: DC
  Administered 2022-03-08: 81 mg via ORAL
  Filled 2022-03-07: qty 1

## 2022-03-07 MED ORDER — ATENOLOL 25 MG PO TABS
25.0000 mg | ORAL_TABLET | Freq: Every day | ORAL | Status: DC
  Administered 2022-03-07 – 2022-03-08 (×2): 25 mg via ORAL
  Filled 2022-03-07 (×2): qty 1

## 2022-03-07 MED ORDER — OMEGA-3-ACID ETHYL ESTERS 1 G PO CAPS: 1.0000 | ORAL_CAPSULE | ORAL | Status: DC

## 2022-03-07 MED ORDER — HEPARIN BOLUS VIA INFUSION
3300.0000 [IU] | Freq: Once | INTRAVENOUS | Status: AC
  Administered 2022-03-07: 3300 [IU] via INTRAVENOUS
  Filled 2022-03-07: qty 3300

## 2022-03-07 MED ORDER — ADULT MULTIVITAMIN W/MINERALS CH
1.0000 | ORAL_TABLET | Freq: Every day | ORAL | Status: DC
  Administered 2022-03-08: 1 via ORAL
  Filled 2022-03-07: qty 1

## 2022-03-07 MED ORDER — PANTOPRAZOLE SODIUM 20 MG PO TBEC
20.0000 mg | DELAYED_RELEASE_TABLET | Freq: Every day | ORAL | Status: DC
  Administered 2022-03-08: 20 mg via ORAL
  Filled 2022-03-07: qty 1

## 2022-03-07 MED ORDER — NITROGLYCERIN 0.4 MG SL SUBL: 0.4000 mg | SUBLINGUAL_TABLET | SUBLINGUAL | Status: DC | PRN

## 2022-03-07 MED ORDER — SPIRONOLACTONE 25 MG PO TABS
25.0000 mg | ORAL_TABLET | Freq: Every day | ORAL | Status: DC
  Administered 2022-03-08: 25 mg via ORAL
  Filled 2022-03-07: qty 1

## 2022-03-07 MED ORDER — ASPIRIN 81 MG PO CHEW
324.0000 mg | CHEWABLE_TABLET | Freq: Once | ORAL | Status: AC
  Administered 2022-03-07: 324 mg via ORAL
  Filled 2022-03-07: qty 4

## 2022-03-07 MED ORDER — VITAMIN D 25 MCG (1000 UNIT) PO TABS
1000.0000 [IU] | ORAL_TABLET | Freq: Every day | ORAL | Status: DC
  Administered 2022-03-07 – 2022-03-08 (×2): 1000 [IU] via ORAL
  Filled 2022-03-07 (×2): qty 1

## 2022-03-07 MED ORDER — ONDANSETRON HCL 4 MG/2ML IJ SOLN: 4.0000 mg | Freq: Three times a day (TID) | INTRAMUSCULAR | Status: DC | PRN

## 2022-03-07 MED ORDER — GUAIFENESIN 100 MG/5ML PO LIQD: 200.0000 mg | ORAL | Status: DC | PRN

## 2022-03-07 MED ORDER — AMIODARONE HCL 200 MG PO TABS
200.0000 mg | ORAL_TABLET | Freq: Two times a day (BID) | ORAL | Status: DC
  Administered 2022-03-07: 200 mg via ORAL
  Filled 2022-03-07: qty 1

## 2022-03-07 MED ORDER — ASPIRIN 81 MG PO TBEC: 81.0000 mg | DELAYED_RELEASE_TABLET | Freq: Every day | ORAL | Status: DC

## 2022-03-07 MED ORDER — ATORVASTATIN CALCIUM 20 MG PO TABS
20.0000 mg | ORAL_TABLET | Freq: Every evening | ORAL | Status: DC
  Administered 2022-03-07: 20 mg via ORAL
  Filled 2022-03-07: qty 1

## 2022-03-07 NOTE — Consult Note (Signed)
ANTICOAGULATION CONSULT NOTE - Initial Consult  Pharmacy Consult for heparin infusion Indication: atrial fibrillation  Allergies  Allergen Reactions   Metoprolol Succinate  [Metoprolol Tartrate] Other (See Comments)    headache   Other Rash    "Dexatone"-kidney medication   Dexamethasone Rash    Patient Measurements: Height: '5\' 2"'$  (157.5 cm) Weight: 74.8 kg (165 lb) IBW/kg (Calculated) : 50.1 Heparin Dosing Weight: 66.3kg  Vital Signs: Temp: 98.5 F (36.9 C) (10/22 1801) Temp Source: Oral (10/22 1801) BP: 114/79 (10/22 2230) Pulse Rate: 83 (10/22 2230)  Labs: Recent Labs    03/07/22 1324 03/07/22 1426 03/07/22 1613 03/07/22 2021 03/07/22 2218  HGB  --  16.4*  --   --   --   HCT  --  51.4*  --   --   --   PLT  --  349  --   --   --   APTT  --   --  26 >200* 191*  LABPROT  --   --   --  15.2  --   INR  --   --   --  1.2  --   HEPARINUNFRC  --   --   --   --  1.07*  CREATININE 1.10*  --   --   --   --   TROPONINIHS 7  --  '7 6 7     '$ Estimated Creatinine Clearance: 38.6 mL/min (A) (by C-G formula based on SCr of 1.1 mg/dL (H)).   Medical History: Past Medical History:  Diagnosis Date   Anxiety    Arthritis    Asthma    childhood   Chronic kidney disease    Renal Insufficiency   Colon polyp    Diverticulosis    Factor V Leiden (HCC)    GERD (gastroesophageal reflux disease)    Herpes zoster    History of kidney stones    Hyperlipidemia    Hypertension    Inflammatory bowel disease    MGUS (monoclonal gammopathy of unknown significance)    Osteoporosis    PAT (paroxysmal atrial tachycardia)    Sinus trouble    URI (upper respiratory infection)    2 months ago    Medications:  PTA: N/A Inpatient: Heparin infusion (10/22 > )  Allergies: No AC/APT related allergies  Assessment: 81 year old female with history of hypertension, HLD, and paroxysmal atrial tachycardia presents to ED with chest tightness. Abnormal EKG in ED notes atrial fibrillation.  TSH WNL. Pharmacy consulted for management of heparin infusion in the setting of atrial fibrillation.  Goal of Therapy:  Heparin level 0.3-0.7 units/ml Monitor platelets by anticoagulation protocol: Yes  Date Time aPTT/HL Rate/Comment 10/22 2021 aPTT > 200 MD stopped drip, RN to redraw labs 10/22 2218 191 / 1.07 Supratherapeutic  Plan:  Spoke with RN, heparin still stopped as of 2340 Will restart heparin infusion at 900 units/hr Recheck HL in 8 hrs after restart CBC daily while on heparin  Renda Rolls, PharmD, [facility] 03/07/2022 11:47 PM

## 2022-03-07 NOTE — Consult Note (Signed)
Bonnie Crawford is a 81 y.o. female  [id number]  Primary Cardiologist: Miquel Dunn. MD Reason for Consultation: new onset atrial fibrillation  HPI: Bonnie Crawford is a 81 y.o. female who reports that for the last two and 1/2 days she has not felt like herself.  She is feeling tired.  She is having some chest tightness intermittently it is like a band around her chest under her breast.  Occasionally she will get some tightness in her neck or shoulders.  She has some heaviness in her arms and tingling in her fingers as well, left greater than right. All the symptoms get worse with exertion. Patient also reports having a headache prior to coming to ED, relieved with tylenol.   EKG revealed new onset atrial fibrillation.    Review of Systems: patient reports feeling generalized weakness, chest pressure radiating to her her back below her shoulder blades. Denies shortness of breath.    Past Medical History:  Diagnosis Date   Anxiety    Arthritis    Asthma    childhood   Chronic kidney disease    Renal Insufficiency   Colon polyp    Diverticulosis    Factor V Leiden (HCC)    GERD (gastroesophageal reflux disease)    Herpes zoster    History of kidney stones    Hyperlipidemia    Hypertension    Inflammatory bowel disease    MGUS (monoclonal gammopathy of unknown significance)    Osteoporosis    PAT (paroxysmal atrial tachycardia)    Sinus trouble    URI (upper respiratory infection)    2 months ago    (Not in a hospital admission)     aspirin  324 mg Oral Once   heparin  3,300 Units Intravenous Once    Infusions:  sodium chloride     heparin      Allergies  Allergen Reactions   Metoprolol Succinate  [Metoprolol Tartrate] Other (See Comments)    headache   Other Rash    "Dexatone"-kidney medication   Dexamethasone Rash    Social History   Socioeconomic History   Marital status: Significant Other    Spouse name: Not on file   Number of children: Not  on file   Years of education: Not on file   Highest education level: Not on file  Occupational History   Not on file  Tobacco Use   Smoking status: Former    Types: Cigarettes    Quit date: 29    Years since quitting: 26.8   Smokeless tobacco: Never  Vaping Use   Vaping Use: Never used  Substance and Sexual Activity   Alcohol use: Yes    Comment: once a year   Drug use: Never   Sexual activity: Not Currently    Birth control/protection: Post-menopausal  Other Topics Concern   Not on file  Social History Narrative   Not on file   Social Determinants of Health   Financial Resource Strain: Not on file  Food Insecurity: Not on file  Transportation Needs: Not on file  Physical Activity: Not on file  Stress: Not on file  Social Connections: Not on file  Intimate Partner Violence: Not on file    Family History  Problem Relation Age of Onset   Lung cancer Sister    Breast cancer Sister 45   Factor V Leiden deficiency Brother    Lung cancer Brother    Kidney cancer Brother    Factor V Leiden deficiency  Brother    Factor V Leiden deficiency Brother    Stomach cancer Brother    Vasculitis Sister     PHYSICAL EXAM: Vitals:   03/07/22 1316  BP: (!) 115/93  Pulse: 66  Resp: 16  Temp: 98.5 F (36.9 C)  SpO2: 96%    No intake or output data in the 24 hours ending 03/07/22 1612  General:  Well appearing. No respiratory difficulty HEENT: normal Neck: supple. no JVD. Carotids 2+ bilat; no bruits. No lymphadenopathy or thryomegaly appreciated. Cor: PMI nondisplaced. Regular rate & rhythm. No rubs, gallops or murmurs. Lungs: clear Abdomen: soft, nontender, nondistended. No hepatosplenomegaly. No bruits or masses. Good bowel sounds. Extremities: no cyanosis, clubbing, rash, edema Neuro: alert & oriented x 3, cranial nerves grossly intact. moves all 4 extremities w/o difficulty. Affect pleasant.  ECG: atrial fibrillation, HR 90 bpm, non-specific ST changes  Results  for orders placed or performed during the hospital encounter of 03/07/22 (from the past 24 hour(s))  TSH     Status: None   Collection Time: 03/07/22  1:23 PM  Result Value Ref Range   TSH 3.758 0.350 - 4.500 uIU/mL  Basic metabolic panel     Status: Abnormal   Collection Time: 03/07/22  1:24 PM  Result Value Ref Range   Sodium 140 135 - 145 mmol/L   Potassium 4.3 3.5 - 5.1 mmol/L   Chloride 110 98 - 111 mmol/L   CO2 25 22 - 32 mmol/L   Glucose, Bld 110 (H) 70 - 99 mg/dL   BUN 14 8 - 23 mg/dL   Creatinine, Ser 1.10 (H) 0.44 - 1.00 mg/dL   Calcium 9.6 8.9 - 10.3 mg/dL   GFR, Estimated 51 (L) >60 mL/min   Anion gap 5 5 - 15  Troponin I (High Sensitivity)     Status: None   Collection Time: 03/07/22  1:24 PM  Result Value Ref Range   Troponin I (High Sensitivity) 7 <18 ng/L  SARS Coronavirus 2 by RT PCR (hospital order, performed in [place] hospital lab) *cepheid single result test* Anterior Nasal Swab     Status: None   Collection Time: 03/07/22  1:24 PM   Specimen: Anterior Nasal Swab  Result Value Ref Range   SARS Coronavirus 2 by RT PCR NEGATIVE NEGATIVE  CBC     Status: Abnormal   Collection Time: 03/07/22  2:26 PM  Result Value Ref Range   WBC 8.1 4.0 - 10.5 K/uL   RBC 5.72 (H) 3.87 - 5.11 MIL/uL   Hemoglobin 16.4 (H) 12.0 - 15.0 g/dL   HCT 51.4 (H) 36.0 - 46.0 %   MCV 89.9 80.0 - 100.0 fL   MCH 28.7 26.0 - 34.0 pg   MCHC 31.9 30.0 - 36.0 g/dL   RDW 13.2 11.5 - 15.5 %   Platelets 349 150 - 400 K/uL   nRBC 0.0 0.0 - 0.2 %  Hepatic function panel     Status: None   Collection Time: 03/07/22  2:26 PM  Result Value Ref Range   Total Protein 7.5 6.5 - 8.1 g/dL   Albumin 4.0 3.5 - 5.0 g/dL   AST 28 15 - 41 U/L   ALT 22 0 - 44 U/L   Alkaline Phosphatase 59 38 - 126 U/L   Total Bilirubin 1.0 0.3 - 1.2 mg/dL   Bilirubin, Direct 0.1 0.0 - 0.2 mg/dL   Indirect Bilirubin 0.9 0.3 - 0.9 mg/dL   DG Chest 2 View  Result Date:  03/07/2022 CLINICAL DATA:  Chest pain. EXAM:  CHEST - 2 VIEW COMPARISON:  None Available. FINDINGS: The heart size and mediastinal contours are within normal limits. Both lungs are clear. The visualized skeletal structures are unremarkable. IMPRESSION: No active cardiopulmonary disease. Electronically Signed   By: Marijo Conception M.D.   On: 03/07/2022 13:54     ASSESSMENT AND PLAN: Patient came to ED today complaining of weakness, chest pressure radiating to her back, arm heaviness. New onset atrial fibrillation on EKG. Normal coronary anatomy cardiac cath 03/2018. Heparin started. Will start amiodarone 200 mg twice daily. Trend troponin levels. Will continue to follow.   Engineer, drilling FNP-C

## 2022-03-07 NOTE — ED Provider Notes (Signed)
[facility] Provider Note    Event Date/Time   First MD Initiated Contact with Patient 03/07/22 1454     (approximate)   History   Chest Pain   HPI  Bonnie Crawford is a 81 y.o. female who reports that for the last two and 1/2 days she has not felt like herself.  She is feeling tired.  She is having some chest tightness intermittently it is like a band around her chest under her breast.  Occasionally she will get some tightness in her neck or shoulders.  She has some heaviness in her arms and tingling in her fingers as well.  All the symptoms get worse with exertion.  Additionally patient reports crampy abdominal pain and some diarrhea for the last couple days.  On arrival here, patient saw a wheelchair which was then used and sat in it.  Unfortunately it was another patient's wheelchair and the bedbugs that were on it quickly came up onto her.      Physical Exam   Triage Vital Signs: ED Triage Vitals  Enc Vitals Group     BP 03/07/22 1316 (!) 115/93     Pulse Rate 03/07/22 1316 66     Resp 03/07/22 1316 16     Temp 03/07/22 1316 98.5 F (36.9 C)     Temp Source 03/07/22 1316 Oral     SpO2 03/07/22 1316 96 %     Weight 03/07/22 1314 165 lb (74.8 kg)     Height 03/07/22 1314 '5\' 2"'$  (1.575 m)     Head Circumference --      Peak Flow --      Pain Score 03/07/22 1314 5     Pain Loc --      Pain Edu? --      Excl. in [place]? --     Most recent vital signs: Vitals:   03/07/22 1316  BP: (!) 115/93  Pulse: 66  Resp: 16  Temp: 98.5 F (36.9 C)  SpO2: 96%     General: Awake, no distress.  CV:  Good peripheral perfusion.  Heart irregularly irregular rhythm occasionally tacky Resp:  Normal effort.  Lungs are clear Abd:  No distention.  Soft and nontender patient reports she has cramps and no pain on palpation. Extremities: Trace edema patient reports this is old.   ED Results / Procedures / Treatments   Labs (all labs ordered are listed, but  only abnormal results are displayed) Labs Reviewed  BASIC METABOLIC PANEL - Abnormal; Notable for the following components:      Result Value   Glucose, Bld 110 (*)    Creatinine, Ser 1.10 (*)    GFR, Estimated 51 (*)    All other components within normal limits  CBC - Abnormal; Notable for the following components:   RBC 5.72 (*)    Hemoglobin 16.4 (*)    HCT 51.4 (*)    All other components within normal limits  SARS CORONAVIRUS 2 BY RT PCR  GASTROINTESTINAL PANEL BY PCR, STOOL (REPLACES STOOL CULTURE)  C DIFFICILE QUICK SCREEN W PCR REFLEX    TSH  HEPATIC FUNCTION PANEL  APTT  TROPONIN I (HIGH SENSITIVITY)  TROPONIN I (HIGH SENSITIVITY)     EKG  EKG read interpreted by me shows what appears to be a flutter at a rate of 90 normal axis no apparent ST T changes that are acute.   RADIOLOGY Chest x-ray read by radiology reviewed and interpreted by me as  negative  PROCEDURES:  Critical Care performed: Critical care time 20 minutes.  This includes speaking to the patient and her husband and reviewing her old records and her new studies.  Additionally I spoke initially with 1 hospitalist who then went off duty and I spoke with the second hospitalist and the cardiologist who I texted with several times.  Procedures   MEDICATIONS ORDERED IN ED: Medications  0.9 %  sodium chloride infusion (has no administration in time range)  aspirin chewable tablet 324 mg (has no administration in time range)  heparin bolus via infusion 3,300 Units (has no administration in time range)  heparin ADULT infusion 100 units/mL (25000 units/25m) (has no administration in time range)     IMPRESSION / MDM / ASSESSMENT AND PLAN / ED COURSE  I reviewed the triage vital signs and the nursing notes. Patient with chest heaviness radiation to the neck and arms worse with exertion along with shortness of breath is worse with exertion.  There is no real pleuritic chest pain although occasionally she  will have a sharp pain that is very brief in the middle of her chest.  She has new onset atrial fibrillation and her symptoms are suggestive of angina.  She reports she usually has a heart rate in the high 40s or low 50s so her heart rate of 90 is considerably fast for her.  Differential diagnosis includes, but is not limited to, TSH is normal so this is not likely to be caused by hyperthyroidism.  She does not drink alcohol and her husband agrees.  And she has had 2 cups of coffee a day for years and today had decaf.  She may just be having angina or possibly a PE.  Patient's presentation is most consistent with acute presentation with potential threat to life or bodily function.  The patient is on the cardiac monitor to evaluate for evidence of arrhythmia and/or significant heart rate changes.  A-fib has been seen a flutter has been seen is possible that the A-fib is just due to the P waves being too small to be picked up consistently by the monitor. We will admit the patient for new onset A-fib with possible angina.  I will give the patient some fluids as she likely is dehydrated from her diarrhea.  We will wait for the stool specimens to come back.   FINAL CLINICAL IMPRESSION(S) / ED DIAGNOSES   Final diagnoses:  Chest pain due to myocardial ischemia, unspecified ischemic chest pain type  [place]     Rx / DC Orders   ED Discharge Orders     None        Note:  This document was prepared using Dragon voice recognition software and may include unintentional dictation errors.   MNena Polio MD 03/07/22 [phone]

## 2022-03-07 NOTE — Consult Note (Signed)
ANTICOAGULATION CONSULT NOTE - Initial Consult  Pharmacy Consult for heparin infusion Indication: atrial fibrillation  Allergies  Allergen Reactions   Metoprolol Succinate  [Metoprolol Tartrate] Other (See Comments)    headache   Other Rash    "Dexatone"-kidney medication   Dexamethasone Rash    Patient Measurements: Height: '5\' 2"'$  (157.5 cm) Weight: 74.8 kg (165 lb) IBW/kg (Calculated) : 50.1 Heparin Dosing Weight: 66.3kg  Vital Signs: Temp: 98.5 F (36.9 C) (10/22 1316) Temp Source: Oral (10/22 1316) BP: 115/93 (10/22 1316) Pulse Rate: 66 (10/22 1316)  Labs: Recent Labs    03/07/22 1324 03/07/22 1426  HGB  --  16.4*  HCT  --  51.4*  PLT  --  349  CREATININE 1.10*  --   TROPONINIHS 7  --     Estimated Creatinine Clearance: 38.6 mL/min (A) (by C-G formula based on SCr of 1.1 mg/dL (H)).   Medical History: Past Medical History:  Diagnosis Date   Anxiety    Arthritis    Asthma    childhood   Chronic kidney disease    Renal Insufficiency   Colon polyp    Diverticulosis    Factor V Leiden (HCC)    GERD (gastroesophageal reflux disease)    Herpes zoster    History of kidney stones    Hyperlipidemia    Hypertension    Inflammatory bowel disease    MGUS (monoclonal gammopathy of unknown significance)    Osteoporosis    PAT (paroxysmal atrial tachycardia)    Sinus trouble    URI (upper respiratory infection)    2 months ago    Medications:  PTA: N/A Inpatient: Heparin infusion (10/22 > )  Allergies: No AC/APT related allergies  Assessment: 81 year old female with history of hypertension, HLD, and paroxysmal atrial tachycardia presents to ED with chest tightness. Abnormal EKG in ED notes atrial fibrillation. TSH WNL. Pharmacy consulted for management of heparin infusion in the setting of atrial fibrillation.  Goal of Therapy:  Heparin level 0.3-0.7 units/ml Monitor platelets by anticoagulation protocol:  Yes  Date Time aPTT/HL Rate/Comment   Plan:  Order STAT aPTT Give 3300 units bolus x1; then start heparin infusion at 1100 units/hr Check anti-Xa level in 8 hours and daily once consecutively therapeutic. Continue to monitor H&H and platelets daily while on heparin gtt.   Darrick Penna 03/07/2022,3:56 PM

## 2022-03-07 NOTE — ED Triage Notes (Signed)
Pt presents via POV c/o chest pain for the last couple of days accompanied by body aches, arm pain, back pain. Reports symptoms worse today. Also c/o weakness. Denies fevers. Reports acid reflux as well. A&Ox4.

## 2022-03-07 NOTE — ED Triage Notes (Signed)
Lab called for assistance with blood collection. Lab arrived and refused to collect specimens due to pt having bed bugs.

## 2022-03-07 NOTE — ED Notes (Signed)
Pharmacy consulted regarding aPTT of 191.  Orders changed to start heparin at this time at 900 units / hour and redraw heparin level at 0800.

## 2022-03-07 NOTE — H&P (Signed)
History and Physical    Bonnie Crawford ZOX:[id number] DOB: 1940/07/07 DOA: 03/07/2022  Referring MD/NP/PA:   PCP: Idelle Crouch, MD   Patient coming from:  The patient is coming from home.  At baseline, pt is independent for most of ADL.        Chief Complaint: chest pain and chest pressure  HPI: Bonnie Crawford is a 81 y.o. female with medical history significant of factor V Leyden mutation, hypertension, hyperlipidemia, asthma, GERD, anxiety, DVT, PVC, MGUS, IBS, CKD-3, edema, who presents with chest pain and chest pressure.  Patient states that she has intermittent chest pressure and chest pain for almost 4 days.  The pressure is more than chest pain.  It is located in the front chest, mild to moderate, pressure feeling, radiating to the back.  Denies palpitation or heart racing.  Associated with mild shortness of breath on exertion.  Denies cough, fever or chills.  Patient has nausea, no vomiting or abdominal pain.  Patient has several episode of diarrhea.  Strongly denies any head or neck injury recently.  Denies rectal bleeding or dark stool.  Patient was found to have new onset atrial fibrillation, with heart rate 60s.   Data reviewed independently and ED Course: pt was found to have troponin of 7, negative COVID PCR, renal function close to baseline, temperature normal, blood pressure 115/93, RR 16, oxygen saturation 96% on room air.  Chest x-ray negative.  Cardiology NP, Amber Scoggins is consulted.   EKG: I have personally reviewed.  A-fib, QTc 406, LAD, poor R wave progression   Review of Systems:   General: no fevers, chills, no body weight gain, has fatigue HEENT: no blurry vision, hearing changes or sore throat Respiratory: Has dyspnea, no coughing, wheezing CV: Has chest pain, no palpitations GI: no nausea, vomiting, abdominal pain, has diarrhea, no constipation GU: no dysuria, burning on urination, increased urinary frequency, hematuria  Ext: Has lymphedema in  both legs Neuro: no unilateral weakness, numbness, or tingling, no vision change or hearing loss Skin: no rash, no skin tear. MSK: No muscle spasm, no deformity, no limitation of range of movement in spin Heme: No easy bruising.  Travel history: No recent long distant travel.   Allergy:  Allergies  Allergen Reactions   Metoprolol Succinate  [Metoprolol Tartrate] Other (See Comments)    headache   Other Rash    "Dexatone"-kidney medication   Dexamethasone Rash    Past Medical History:  Diagnosis Date   Anxiety    Arthritis    Asthma    childhood   Chronic kidney disease    Renal Insufficiency   Colon polyp    Diverticulosis    Factor V Leiden ([place])    GERD (gastroesophageal reflux disease)    Herpes zoster    History of kidney stones    Hyperlipidemia    Hypertension    Inflammatory bowel disease    MGUS (monoclonal gammopathy of unknown significance)    Osteoporosis    PAT (paroxysmal atrial tachycardia)    Sinus trouble    URI (upper respiratory infection)    2 months ago    Past Surgical History:  Procedure Laterality Date   ABDOMINAL HYSTERECTOMY     APPENDECTOMY     BLEPHAROPLASTY Bilateral    BREAST CYST ASPIRATION Left    neg   CARDIAC CATHETERIZATION     CATARACT EXTRACTION W/PHACO Left 07/28/2020   Procedure: CATARACT EXTRACTION PHACO AND INTRAOCULAR LENS PLACEMENT (IOC) LEFT PANOPTIX TORIC 4.25 00:30.9;  Surgeon: Eulogio Bear, MD;  Location: [facility];  Service: Ophthalmology;  Laterality: Left;   CATARACT EXTRACTION W/PHACO Right 08/18/2020   Procedure: CATARACT EXTRACTION PHACO AND INTRAOCULAR LENS PLACEMENT (IOC) RIGHT PANOPTIX TORIC 2.31 00:20.0;  Surgeon: Eulogio Bear, MD;  Location: [facility];  Service: Ophthalmology;  Laterality: Right;   COLONOSCOPY     KNEE SURGERY Left    2 meniscus tears   NECK SURGERY     cosmetic    Social History:  reports that she quit smoking about 26 years ago. Her smoking use  included cigarettes. She has never used smokeless tobacco. She reports current alcohol use. She reports that she does not use drugs.  Family History:  Family History  Problem Relation Age of Onset   Lung cancer Sister    Breast cancer Sister 28   Factor V Leiden deficiency Brother    Lung cancer Brother    Kidney cancer Brother    Factor V Leiden deficiency Brother    Factor V Leiden deficiency Brother    Stomach cancer Brother    Vasculitis Sister      Prior to Admission medications   Medication Sig Start Date End Date Taking? Authorizing Provider  acetaminophen (TYLENOL) 500 MG tablet Take 500 mg by mouth every 6 (six) hours as needed.     [provider]  aspirin EC 81 MG tablet Take 81 mg by mouth daily.    [provider]  atenolol (TENORMIN) 25 MG tablet Take by mouth. 11/27/19   [provider]  atorvastatin (LIPITOR) 20 MG tablet Take 20 mg by mouth daily.    [provider]  Calcium Citrate-Vitamin D 315-250 MG-UNIT TABS Take by mouth.    [provider]  carbamide peroxide (DEBROX) 6.5 % OTIC solution 5 drops as needed.    [provider]  carboxymethylcellulose (REFRESH PLUS) 0.5 % SOLN 1 drop 2 (two) times daily as needed.    [provider]  cetirizine (ZYRTEC) 5 MG tablet Take 5 mg by mouth daily.    [provider]  Cholecalciferol (VITAMIN D-1000 MAX ST) 25 MCG (1000 UT) tablet Take by mouth.    [provider]  COLLAGEN PO Take by mouth daily.    [provider]  conjugated estrogens (PREMARIN) vaginal cream 0.5g vaginally twice weekly 02/09/16   [provider]  dextromethorphan-guaiFENesin (MUCINEX DM) 30-600 MG 12hr tablet Take 1 tablet by mouth 2 (two) times daily.    [provider]  diphenhydrAMINE (BENADRYL) 25 MG tablet Take 25 mg by mouth every 6 (six) hours as needed.     [provider]  hydrochlorothiazide (HYDRODIURIL) 25 MG tablet 3 (three)  times a week. M, W, F Patient not taking: Reported on 02/17/2022 04/23/20   [provider]  Lifitegrast Shirley Friar) 5 % SOLN Apply to eye.    [provider]  Magnesium 500 MG TABS Take by mouth 3 (three) times a week. Patient not taking: Reported on 09/28/2021    [provider]  mirabegron ER (MYRBETRIQ) 50 MG TB24 tablet Take 1 tablet (50 mg total) by mouth daily. Patient taking differently: Take 50 mg by mouth daily. Cain Saupe, Sat 04/06/19   Billey Co, MD  Multiple Vitamin (MULTIVITAMIN) tablet Take by mouth. Patient not taking: Reported on 09/28/2021    [provider]  neomycin-polymyxin-hydrocortisone (CORTISPORIN) OTIC solution Place 4 drops into both ears at bedtime.    [provider]  Nutritional Supplements (  KETO PO) Take by mouth in the morning and at bedtime. Keto Strong and Keto Strong Detox    [provider]  Omega-3 Fatty Acids (OMEGA-3 PLUS PO) Take by mouth 3 (three) times a week.    [provider]  omeprazole (PRILOSEC) 20 MG capsule Take 20 mg by mouth daily as needed.  11/07/18   [provider]  predniSONE (STERAPRED UNI-PAK 21 TAB) 10 MG (21) TBPK tablet Take 6 tablets on the first day and decrease by 1 tablet each day until finished. Patient not taking: Reported on 09/28/2021 01/21/19   Sherrie George B, FNP  psyllium (METAMUCIL) 58.6 % powder Take 1 packet by mouth daily as needed.     [provider]  Simethicone (GAS-X PO) Take by mouth as needed.    [provider]  Turmeric 500 MG CAPS Take by mouth.    [provider]  Ubiquinol 100 MG CAPS Take 100 mg by mouth daily.     [provider]    Physical Exam: Vitals:   03/07/22 1314 03/07/22 1316  BP:  (!) 115/93  Pulse:  66  Resp:  16  Temp:  98.5 F (36.9 C)  TempSrc:  Oral  SpO2:  96%  Weight: 74.8 kg   Height: '5\' 2"'$  (1.575 m)    General: Not in acute distress HEENT:       Eyes: PERRL, EOMI,  no scleral icterus.       ENT: No discharge from the ears and nose, no pharynx injection, no tonsillar enlargement.        Neck: No JVD, no bruit, no mass felt. Heme: No neck lymph node enlargement. Cardiac: S1/S2, irregularly irregular rhythm, no murmurs, No gallops or rubs. Respiratory: No rales, wheezing, rhonchi or rubs. GI: Soft, nondistended, nontender, no rebound pain, no organomegaly, BS present. GU: No hematuria Ext: 1+DP/PT pulse bilaterally. Chronic lymphedema in both legs, 2+ bilateral lower leg edema. Musculoskeletal: No joint deformities, No joint redness or warmth, no limitation of ROM in spin. Skin: No rashes.  Neuro: Alert, oriented X3, cranial nerves II-XII grossly intact, moves all extremities normally. Psych: Patient is not psychotic, no suicidal or hemocidal ideation.  Labs on Admission: I have personally reviewed following labs and imaging studies  CBC: Recent Labs  Lab 03/07/22 1426  WBC 8.1  HGB 16.4*  HCT 51.4*  MCV 89.9  PLT 470   Basic Metabolic Panel: Recent Labs  Lab 03/07/22 1324  NA 140  K 4.3  CL 110  CO2 25  GLUCOSE 110*  BUN 14  CREATININE 1.10*  CALCIUM 9.6   GFR: Estimated Creatinine Clearance: 38.6 mL/min (A) (by C-G formula based on SCr of 1.1 mg/dL (H)). Liver Function Tests: Recent Labs  Lab 03/07/22 1426  AST 28  ALT 22  ALKPHOS 59  BILITOT 1.0  PROT 7.5  ALBUMIN 4.0   No results for input(s): "LIPASE", "AMYLASE" in the last 168 hours. No results for input(s): "AMMONIA" in the last 168 hours. Coagulation Profile: No results for input(s): "INR", "PROTIME" in the last 168 hours. Cardiac Enzymes: No results for input(s): "CKTOTAL", "CKMB", "CKMBINDEX", "TROPONINI" in the last 168 hours. BNP (last 3 results) No results for input(s): "PROBNP" in the last 8760 hours. HbA1C: No results for input(s): "HGBA1C" in the last 72 hours. CBG: No results for input(s): "GLUCAP" in the last 168 hours. Lipid Profile: No results  for input(s): "CHOL", "HDL", "LDLCALC", "TRIG", "CHOLHDL", "LDLDIRECT" in the last 72 hours. Thyroid Function Tests:  Recent Labs    03/07/22 1323  TSH 3.758   Anemia Panel: No results for input(s): "VITAMINB12", "FOLATE", "FERRITIN", "TIBC", "IRON", "RETICCTPCT" in the last 72 hours. Urine analysis:    Component Value Date/Time   APPEARANCEUR Clear 03/28/2019 1408   GLUCOSEU Negative 03/28/2019 1408   BILIRUBINUR Negative 03/28/2019 1408   PROTEINUR Negative 03/28/2019 1408   NITRITE Negative 03/28/2019 1408   LEUKOCYTESUR Negative 03/28/2019 1408   Sepsis Labs: '@LABRCNTIP'$ (procalcitonin:4,lacticidven:4) ) Recent Results (from the past 240 hour(s))  SARS Coronavirus 2 by RT PCR (hospital order, performed in [facility] hospital lab) *cepheid single result test* Anterior Nasal Swab     Status: None   Collection Time: 03/07/22  1:24 PM   Specimen: Anterior Nasal Swab  Result Value Ref Range Status   SARS Coronavirus 2 by RT PCR NEGATIVE NEGATIVE Final    Comment: (NOTE) SARS-CoV-2 target nucleic acids are NOT DETECTED.  The SARS-CoV-2 RNA is generally detectable in upper and lower respiratory specimens during the acute phase of infection. The lowest concentration of SARS-CoV-2 viral copies this assay can detect is 250 copies / mL. A negative result does not preclude SARS-CoV-2 infection and should not be used as the sole basis for treatment or other patient management decisions.  A negative result may occur with improper specimen collection / handling, submission of specimen other than nasopharyngeal swab, presence of viral mutation(s) within the areas targeted by this assay, and inadequate number of viral copies (<250 copies / mL). A negative result must be combined with clinical observations, patient history, and epidemiological information.  Fact Sheet for Patients:   https://www.patel.info/  Fact Sheet for Healthcare  Providers: https://hall.com/  This test is not yet approved or  cleared by the [place] FDA and has been authorized for detection and/or diagnosis of SARS-CoV-2 by FDA under an Emergency Use Authorization (EUA).  This EUA will remain in effect (meaning this test can be used) for the duration of the COVID-19 declaration under Section 564(b)(1) of the Act, 21 U.S.C. section 360bbb-3(b)(1), unless the authorization is terminated or revoked sooner.  Performed at [facility], [address] [place], [place] [address]      Radiological Exams on Admission: DG Chest 2 View  Result Date: 03/07/2022 CLINICAL DATA:  Chest pain. EXAM: CHEST - 2 VIEW COMPARISON:  None Available. FINDINGS: The heart size and mediastinal contours are within normal limits. Both lungs are clear. The visualized skeletal structures are unremarkable. IMPRESSION: No active cardiopulmonary disease. Electronically Signed   By: Marijo Conception M.D.   On: 03/07/2022 13:54      Assessment/Plan Principal Problem:   Chest pain Active Problems:   CAD (coronary artery disease)   New onset atrial fibrillation (HCC)   Essential hypertension   Stage 3a chronic kidney disease (HCC)   Asthma   HLD (hyperlipidemia)   Diarrhea   Lymphedema   Obesity with body mass index (BMI) of 30.0 to 39.9   Assessment and Plan:  Chest pain/pressure and hx of CAD: Initial troponin negative.  Patient has history of factor V Leiden mutation, at high risk of developing chronic clot.  Will need to rule out PE.  Consulted cardiology NP, [employer].  - place to tele bed for observation - Trend Trop - prn Nitroglycerin, Morphine - Aspirin and Lipitor - Risk factor stratification: will check FLP and A1C  - started IV heparin due to new onset atrial fibrillation - Follow-up CTA to rule out PE  New onset atrial  fibrillation [facility]): Heart rate 60s.  CHADS2 score is 4, will need anticoagulants. -Start  IV heparin -Amiodarone 200 mg twice daily per [employer] -Continue home atenolol  Essential hypertension -IV hydralazine as needed -Continue atenolol -Continue home spironolactone  Stage 3a chronic kidney disease ([place]): Renal function close to baseline.  Recent baseline creatinine 1.04 on 02/24/2022.  Her creatinine is 1.10, BUN 14, GFR 51 -Follow up with BMP  Asthma: Stable -Bronchodilators  HLD (hyperlipidemia) -Lipitor  Diarrhea -C. Difficile  Lymphedema -Home spironolactone  Obesity with body mass index (BMI) of 30. 1 8 -Diet and exercise.   -Encourage to lose weight.             DVT ppx: On IV heparin  Code Status: Full code  Family Communication: Yes, patient's aspirin at bed side.   Disposition Plan:  Anticipate discharge back to previous environment  Consults called:  Cardiology NP, Amber Scoggins is consulted.  Admission status and Level of care: Telemetry Cardiac:    for obs      Dispo: The patient is from: Home              Anticipated d/c is to: Home              Anticipated d/c date is: 1 day              Patient currently is not medically stable to d/c.    Severity of Illness:  The appropriate patient status for this patient is OBSERVATION. Observation status is judged to be reasonable and necessary in order to provide the required intensity of service to ensure the patient's safety. The patient's presenting symptoms, physical exam findings, and initial radiographic and laboratory data in the context of their medical condition is felt to place them at decreased risk for further clinical deterioration. Furthermore, it is anticipated that the patient will be medically stable for discharge from the hospital within 2 midnights of admission.        Date of Service 03/07/2022    [facility]   If 7PM-7AM, please contact night-coverage [email or web address] 03/07/2022, 5:38 PM

## 2022-03-08 ENCOUNTER — Other Ambulatory Visit (HOSPITAL_COMMUNITY): Payer: Self-pay

## 2022-03-08 ENCOUNTER — Telehealth (HOSPITAL_COMMUNITY): Payer: Self-pay | Admitting: Pharmacy Technician

## 2022-03-08 DIAGNOSIS — I5030 Unspecified diastolic (congestive) heart failure: Secondary | ICD-10-CM | POA: Diagnosis not present

## 2022-03-08 DIAGNOSIS — I259 Chronic ischemic heart disease, unspecified: Secondary | ICD-10-CM

## 2022-03-08 DIAGNOSIS — N1831 Chronic kidney disease, stage 3a: Secondary | ICD-10-CM | POA: Diagnosis not present

## 2022-03-08 DIAGNOSIS — I10 Essential (primary) hypertension: Secondary | ICD-10-CM | POA: Diagnosis not present

## 2022-03-08 DIAGNOSIS — R079 Chest pain, unspecified: Secondary | ICD-10-CM | POA: Diagnosis not present

## 2022-03-08 DIAGNOSIS — I4891 Unspecified atrial fibrillation: Secondary | ICD-10-CM | POA: Diagnosis not present

## 2022-03-08 LAB — CBC
HCT: 40.3 % (ref 36.0–46.0)
Hemoglobin: 12.8 g/dL (ref 12.0–15.0)
MCH: 29.1 pg (ref 26.0–34.0)
MCHC: 31.8 g/dL (ref 30.0–36.0)
MCV: 91.6 fL (ref 80.0–100.0)
Platelets: 265 10*3/uL (ref 150–400)
RBC: 4.4 MIL/uL (ref 3.87–5.11)
RDW: 13.2 % (ref 11.5–15.5)
WBC: 8.4 10*3/uL (ref 4.0–10.5)
nRBC: 0 % (ref 0.0–0.2)

## 2022-03-08 LAB — BASIC METABOLIC PANEL
Anion gap: 6 (ref 5–15)
BUN: 18 mg/dL (ref 8–23)
CO2: 20 mmol/L — ABNORMAL LOW (ref 22–32)
Calcium: 7.4 mg/dL — ABNORMAL LOW (ref 8.9–10.3)
Chloride: 117 mmol/L — ABNORMAL HIGH (ref 98–111)
Creatinine, Ser: 0.93 mg/dL (ref 0.44–1.00)
GFR, Estimated: >60 mL/min (ref >60)
Glucose, Bld: 91 mg/dL (ref 70–99)
Potassium: 3.6 mmol/L (ref 3.5–5.1)
Sodium: 143 mmol/L (ref 135–145)

## 2022-03-08 LAB — LIPID PANEL
Cholesterol: 116 mg/dL (ref 0–200)
HDL: 41 mg/dL (ref >40)
LDL Cholesterol: 62 mg/dL (ref 0–99)
Total CHOL/HDL Ratio: 2.8 RATIO
Triglycerides: 67 mg/dL (ref <150)
VLDL: 13 mg/dL (ref 0–40)

## 2022-03-08 LAB — HEPARIN LEVEL (UNFRACTIONATED): Heparin Unfractionated: 1.05 IU/mL — ABNORMAL HIGH (ref 0.30–0.70)

## 2022-03-08 MED ORDER — AMIODARONE HCL 200 MG PO TABS: 200.0000 mg | ORAL_TABLET | Freq: Every day | ORAL | Status: DC

## 2022-03-08 MED ORDER — HEPARIN (PORCINE) 25000 UT/250ML-% IV SOLN: 700.0000 [IU]/h | INTRAVENOUS | Status: DC

## 2022-03-08 MED ORDER — AMIODARONE HCL 200 MG PO TABS: 200.0000 mg | ORAL_TABLET | Freq: Two times a day (BID) | ORAL | Status: DC

## 2022-03-08 MED ORDER — ATENOLOL 25 MG PO TABS: 50.0000 mg | ORAL_TABLET | Freq: Every day | ORAL | Status: DC

## 2022-03-08 MED ORDER — ATENOLOL 50 MG PO TABS: 50.0000 mg | ORAL_TABLET | Freq: Every day | ORAL | 2 refills | Status: AC

## 2022-03-08 MED ORDER — AMIODARONE HCL 200 MG PO TABS: 200.0000 mg | ORAL_TABLET | Freq: Two times a day (BID) | ORAL | 2 refills | Status: AC

## 2022-03-08 MED ORDER — APIXABAN 5 MG PO TABS: 5.0000 mg | ORAL_TABLET | Freq: Two times a day (BID) | ORAL | 3 refills | Status: AC

## 2022-03-08 MED ORDER — AMIODARONE HCL 200 MG PO TABS
200.0000 mg | ORAL_TABLET | Freq: Two times a day (BID) | ORAL | Status: DC
  Administered 2022-03-08: 200 mg via ORAL
  Filled 2022-03-08: qty 1

## 2022-03-08 MED ORDER — ATENOLOL 25 MG PO TABS
25.0000 mg | ORAL_TABLET | Freq: Once | ORAL | Status: AC
  Administered 2022-03-08: 25 mg via ORAL
  Filled 2022-03-08: qty 1

## 2022-03-08 MED ORDER — APIXABAN 5 MG PO TABS
5.0000 mg | ORAL_TABLET | Freq: Two times a day (BID) | ORAL | Status: DC
  Administered 2022-03-08: 5 mg via ORAL
  Filled 2022-03-08: qty 1

## 2022-03-08 NOTE — ED Notes (Signed)
Attempted to draw heparin level, but was unable. Called lab to draw.

## 2022-03-08 NOTE — ED Notes (Signed)
Pt to be discharged. Pt spoke with physician and ready to be dc

## 2022-03-08 NOTE — ED Notes (Signed)
Dc instructions and scripts reviewed with pt. No questions or concerns at this time. Pt will follow up with cardiology and pcp.

## 2022-03-08 NOTE — Telephone Encounter (Signed)
Pharmacy Patient Advocate Encounter  Insurance verification completed.    The patient is insured through [employer] Part D   The patient is currently admitted and ran test claims for the following: Eliquis .  Copays and coinsurance results were relayed to Inpatient clinical team.      

## 2022-03-08 NOTE — Consult Note (Signed)
ANTICOAGULATION CONSULT NOTE - Initial Consult  Pharmacy Consult for heparin infusion Indication: atrial fibrillation  Allergies  Allergen Reactions   Metoprolol Succinate  [Metoprolol Tartrate] Other (See Comments)    headache   Other Rash    "Dexatone"-kidney medication   Dexamethasone Rash    Patient Measurements: Height: '5\' 2"'$  (157.5 cm) Weight: 74.8 kg (165 lb) IBW/kg (Calculated) : 50.1 Heparin Dosing Weight: 66.3kg  Vital Signs: Temp: 97.9 F (36.6 C) (10/23 0810) Temp Source: Oral (10/23 0810) BP: 125/89 (10/23 1010) Pulse Rate: 99 (10/23 1010)  Labs: Recent Labs    03/07/22 1324 03/07/22 1426 03/07/22 1613 03/07/22 2021 03/07/22 2218 03/08/22 0410 03/08/22 0903  HGB  --  16.4*  --   --   --  12.8  --   HCT  --  51.4*  --   --   --  40.3  --   PLT  --  349  --   --   --  265  --   APTT  --   --  26 >200* 191*  --   --   LABPROT  --   --   --  15.2  --   --   --   INR  --   --   --  1.2  --   --   --   HEPARINUNFRC  --   --   --   --  1.07*  --  1.05*  CREATININE 1.10*  --   --   --   --  0.93  --   TROPONINIHS 7  --  '7 6 7  '$ --   --      Estimated Creatinine Clearance: 45.7 mL/min (by C-G formula based on SCr of 0.93 mg/dL).   Medical History: Past Medical History:  Diagnosis Date   Anxiety    Arthritis    Asthma    childhood   Chronic kidney disease    Renal Insufficiency   Colon polyp    Diverticulosis    Factor V Leiden (HCC)    GERD (gastroesophageal reflux disease)    Herpes zoster    History of kidney stones    Hyperlipidemia    Hypertension    Inflammatory bowel disease    MGUS (monoclonal gammopathy of unknown significance)    Osteoporosis    PAT (paroxysmal atrial tachycardia)    Sinus trouble    URI (upper respiratory infection)    2 months ago    Medications:  PTA: N/A Inpatient: Heparin infusion (10/22 > )  Allergies: No AC/APT related allergies  Assessment: 81 year old female with history of hypertension, HLD, and  paroxysmal atrial tachycardia presents to ED with chest tightness. Abnormal EKG in ED notes atrial fibrillation. TSH WNL. Pharmacy consulted for management of heparin infusion in the setting of atrial fibrillation.  Goal of Therapy:  Heparin level 0.3-0.7 units/ml Monitor platelets by anticoagulation protocol: Yes  Date Time aPTT/HL Rate/Comment 10/22 2021 aPTT > 200 MD stopped drip, RN to redraw labs 10/22 2218 191 / 1.07 Supratherapeutic  Plan:  Hold heparin x 1 hour Restart heparin infusion at 700 units/hr Recheck HL in 8 hrs after restart CBC daily while on heparin   Glean Salvo, PharmD Clinical Pharmacist  03/08/2022 10:26 AM

## 2022-03-08 NOTE — Progress Notes (Signed)
[place] NOTE       Patient ID: Bonnie Crawford MRN: [id number] DOB/AGE: 12-05-1940 81 y.o.  Admit date: 03/07/2022 Referring Physician Dr. Blaine Hamper Primary Physician Dr. Fulton Reek Primary Cardiologist Dr. Saralyn Pilar Reason for Consultation new onset AF   HPI: Bonnie Crawford is an 81 year old female with a past medical history of paroxysmal atrial tachycardia, HFpEF (EF >55%, G1 DD 12/2021) essential hypertension, hyperlipidemia, chronic lymphedema, CKD 3A, factor V Leiden (not on anticoagulation), normal coronary arteries by [facility] 03/2018, who presented to [facility] ED 03/07/2022 with intermittent chest discomfort and generalized weakness since 10/19. She was found to be in new onset atrial fibrillation for which cardiology was consulted.  Interval history: -Feels better than when she came in initially.  Somewhat frustrated with not being able to get out of bed and her PureWick leaking. -Remains in atrial fibrillation in the 80s-90s at rest, with paroxysms to the 110s to 130s with conversation so far this morning. -Denies chest pain or shortness of breath, overall feeling good and wants to go home.  Review of systems complete and found to be negative unless listed above     Past Medical History:  Diagnosis Date   Anxiety    Arthritis    Asthma    childhood   Chronic kidney disease    Renal Insufficiency   Colon polyp    Diverticulosis    Factor V Leiden ([place])    GERD (gastroesophageal reflux disease)    Herpes zoster    History of kidney stones    Hyperlipidemia    Hypertension    Inflammatory bowel disease    MGUS (monoclonal gammopathy of unknown significance)    Osteoporosis    PAT (paroxysmal atrial tachycardia)    Sinus trouble    URI (upper respiratory infection)    2 months ago    Past Surgical History:  Procedure Laterality Date   ABDOMINAL HYSTERECTOMY     APPENDECTOMY     BLEPHAROPLASTY Bilateral    BREAST CYST ASPIRATION Left     neg   CARDIAC CATHETERIZATION     CATARACT EXTRACTION W/PHACO Left 07/28/2020   Procedure: CATARACT EXTRACTION PHACO AND INTRAOCULAR LENS PLACEMENT (IOC) LEFT PANOPTIX TORIC 4.25 00:30.9;  Surgeon: Eulogio Bear, MD;  Location: [place];  Service: Ophthalmology;  Laterality: Left;   CATARACT EXTRACTION W/PHACO Right 08/18/2020   Procedure: CATARACT EXTRACTION PHACO AND INTRAOCULAR LENS PLACEMENT (IOC) RIGHT PANOPTIX TORIC 2.31 00:20.0;  Surgeon: Eulogio Bear, MD;  Location: [place];  Service: Ophthalmology;  Laterality: Right;   COLONOSCOPY     KNEE SURGERY Left    2 meniscus tears   NECK SURGERY     cosmetic    (Not in a hospital admission)  Social History   Socioeconomic History   Marital status: Significant Other    Spouse name: Not on file   Number of children: Not on file   Years of education: Not on file   Highest education level: Not on file  Occupational History   Not on file  Tobacco Use   Smoking status: Former    Types: Cigarettes    Quit date: 64    Years since quitting: 26.8   Smokeless tobacco: Never  Vaping Use   Vaping Use: Never used  Substance and Sexual Activity   Alcohol use: Yes    Comment: once a year   Drug use: Never   Sexual activity: Not Currently    Birth control/protection: Post-menopausal  Other  Topics Concern   Not on file  Social History Narrative   Not on file   Social Determinants of Health   Financial Resource Strain: Not on file  Food Insecurity: Not on file  Transportation Needs: Not on file  Physical Activity: Not on file  Stress: Not on file  Social Connections: Not on file  Intimate Partner Violence: Not on file    Family History  Problem Relation Age of Onset   Lung cancer Sister    Breast cancer Sister 1   Factor V Leiden deficiency Brother    Lung cancer Brother    Kidney cancer Brother    Factor V Leiden deficiency Brother    Factor V Leiden deficiency Brother    Stomach cancer  Brother    Vasculitis Sister      Vitals:   03/08/22 0530 03/08/22 0600 03/08/22 0630 03/08/22 0810  BP: 99/75 113/77 (!) 102/54 105/71  Pulse: 88 83 (!) 101 92  Resp: '15 15 15 19  '$ Temp:    97.9 F (36.6 C)  TempSrc:    Oral  SpO2: 99% 97% 95% 99%  Weight:      Height:        PHYSICAL EXAM General: Pleasant Caucasian female, well nourished, in no acute distress. HEENT:  Normocephalic and atraumatic. Neck:  No JVD.  Lungs: Normal respiratory effort on room air. Clear bilaterally to auscultation. No wheezes, crackles, rhonchi.  Heart: Irregularly irregular with controlled rate, short paroxysms of tachycardia. Normal S1 and S2 without gallops or murmurs.  Abdomen: Non-distended appearing.  Msk: Normal strength and tone for age. Extremities: Warm and well perfused. No clubbing, cyanosis.  Chronic bilateral lymphedema at baseline per patient.  Neuro: Alert and oriented X 3. Psych:  Answers questions appropriately.   Labs: Basic Metabolic Panel: Recent Labs    03/07/22 1324 03/08/22 0410  NA 140 143  K 4.3 3.6  CL 110 117*  CO2 25 20*  GLUCOSE 110* 91  BUN 14 18  CREATININE 1.10* 0.93  CALCIUM 9.6 7.4*   Liver Function Tests: Recent Labs    03/07/22 1426  AST 28  ALT 22  ALKPHOS 59  BILITOT 1.0  PROT 7.5  ALBUMIN 4.0   No results for input(s): "LIPASE", "AMYLASE" in the last 72 hours. CBC: Recent Labs    03/07/22 1426 03/08/22 0410  WBC 8.1 8.4  HGB 16.4* 12.8  HCT 51.4* 40.3  MCV 89.9 91.6  PLT 349 265   Cardiac Enzymes: Recent Labs    03/07/22 1613 03/07/22 2021 03/07/22 2218  TROPONINIHS '7 6 7   '$ BNP: No results for input(s): "BNP" in the last 72 hours. D-Dimer: No results for input(s): "DDIMER" in the last 72 hours. Hemoglobin A1C: Recent Labs    03/07/22 1426  HGBA1C 5.7*   Fasting Lipid Panel: Recent Labs    03/08/22 0410  CHOL 116  HDL 41  LDLCALC 62  TRIG 67  CHOLHDL 2.8   Thyroid Function Tests: Recent Labs     03/07/22 1323  TSH 3.758   Anemia Panel: No results for input(s): "VITAMINB12", "FOLATE", "FERRITIN", "TIBC", "IRON", "RETICCTPCT" in the last 72 hours.   Radiology: CT Angio Chest Pulmonary Embolism (PE) W or WO Contrast  Result Date: 03/07/2022 CLINICAL DATA:  Pulmonary embolus suspected with high probability. Tired feeling and chest tightness intermittently for 2-1/2 days. EXAM: CT ANGIOGRAPHY CHEST WITH CONTRAST TECHNIQUE: Multidetector CT imaging of the chest was performed using the standard protocol during bolus administration of  intravenous contrast. Multiplanar CT image reconstructions and MIPs were obtained to evaluate the vascular anatomy. RADIATION DOSE REDUCTION: This exam was performed according to the departmental dose-optimization program which includes automated exposure control, adjustment of the mA and/or kV according to patient size and/or use of iterative reconstruction technique. CONTRAST:  75m OMNIPAQUE IOHEXOL 350 MG/ML SOLN COMPARISON:  None Available. FINDINGS: Cardiovascular: Good opacification of the central and segmental pulmonary arteries. No focal filling defects. No evidence of significant pulmonary embolus. Normal heart size. No pericardial effusions. Normal caliber thoracic aorta. Calcification of the aorta and coronary arteries. Mediastinum/Nodes: Thyroid gland is unremarkable. Esophagus is decompressed. No significant lymphadenopathy. Lungs/Pleura: Motion artifact limits examination. Patchy mosaic attenuation pattern to the lungs could represent motion artifact, air trapping, or edema. Focal nodule in the right lower lung, series five image 77, measuring 6 mm diameter. No pleural effusions. No pneumothorax. Upper Abdomen: Gallbladder is surgically absent. No acute abnormalities. Musculoskeletal: Degenerative changes.  No acute bony abnormalities. Review of the MIP images confirms the above findings. IMPRESSION: 1. No evidence of pulmonary embolus. 2. Patchy mosaic  attenuation pattern to the lungs may represent motion artifact, air trapping, or edema. 3. 6 mm right solid pulmonary nodule. Per Fleischner Society Guidelines, recommend a non-contrast Chest CT at 6-12 months. If patient is high risk for malignancy, consider an additional non-contrast Chest CT at 18-24 months. If patient is low risk for malignancy, non-contrast Chest CT at 18-24 months is optional. These guidelines do not apply to immunocompromised patients and patients with cancer. Follow up in patients with significant comorbidities as clinically warranted. For lung cancer screening, adhere to Lung-RADS guidelines. Reference: Radiology. 2017; 284(1):228-43. Electronically Signed   By: WLucienne CapersM.D.   On: 03/07/2022 19:20   DG Chest 2 View  Result Date: 03/07/2022 CLINICAL DATA:  Chest pain. EXAM: CHEST - 2 VIEW COMPARISON:  None Available. FINDINGS: The heart size and mediastinal contours are within normal limits. Both lungs are clear. The visualized skeletal structures are unremarkable. IMPRESSION: No active cardiopulmonary disease. Electronically Signed   By: JMarijo ConceptionM.D.   On: 03/07/2022 13:54   VAS [place]OWER EXTREMITY VENOUS REFLUX  Result Date: 02/19/2022  Lower Venous Reflux Study Patient Name:  EINDIANNA BORAN Date of Exam:   02/16/2022 Medical Rec #: [id number]        Accession #:    [id number] of Birth: [birth year]       Patient Gender: F Patient Age:   89years Exam Location:  [facility] Procedure:      VAS [place]OWER EXTREMITY VENOUS REFLUX Referring Phys: CHarrell GaveDICKSON --------------------------------------------------------------------------------  Other Indications: Patient presents with chronic leg swelling and lymphedema.                    She feels that the left leg gets more swollen. The right leg                    she has more tingly/prickly sensations. Comparison Study: Previous bilateral venous reflux study performed at [facility]                   04/28/2020  showed deep venous reflux in the right common                   femoral vein, femoral vein and popliteal vein. On the left  there was deep reflux in the left common femoral vein. There                   is a history of bilateral GSV stripping/ablation. Performing Technologist: Mariane Masters RVT  Examination Guidelines: A complete evaluation includes B-mode imaging, spectral Doppler, color Doppler, and power Doppler as needed of all accessible portions of each vessel. Bilateral testing is considered an integral part of a complete examination. Limited examinations for reoccurring indications may be performed as noted. The reflux portion of the exam is performed with the patient in reverse Trendelenburg. Significant venous reflux is defined as >500 ms in the superficial venous system, and >1 second in the deep venous system.  +------------------+---------+------+-----------+------------+-----------------+ LEFT              Reflux NoRefluxReflux TimeDiameter cmsComments                                      Yes                                           +------------------+---------+------+-----------+------------+-----------------+ CFV               no                                                      +------------------+---------+------+-----------+------------+-----------------+ FV prox           no                                                      +------------------+---------+------+-----------+------------+-----------------+ FV mid            no                                                      +------------------+---------+------+-----------+------------+-----------------+ FV dist           no               983 ms                                 +------------------+---------+------+-----------+------------+-----------------+ Popliteal         no                                                       +------------------+---------+------+-----------+------------+-----------------+ GSV at SFJ        no                                                      +------------------+---------+------+-----------+------------+-----------------+  GSV prox thigh                                          prior                                                                     ablation/strippin                                                         g                 +------------------+---------+------+-----------+------------+-----------------+ GSV mid thigh                                           prior                                                                     ablation/strippin                                                         g                 +------------------+---------+------+-----------+------------+-----------------+ GSV dist thigh                                          prior                                                                     ablation/strippin                                                         g                 +------------------+---------+------+-----------+------------+-----------------+ GSV at knee  prior                                                                     ablation/strippin                                                         g                 +------------------+---------+------+-----------+------------+-----------------+ GSV prox calf                                           prior                                                                     ablation/strippin                                                         g                 +------------------+---------+------+-----------+------------+-----------------+ SSV prox calf     no                            0.19                       +------------------+---------+------+-----------+------------+-----------------+ Anterior accessoryno                            0.39                      branch                                                                    +------------------+---------+------+-----------+------------+-----------------+   Summary: Left: - No evidence of deep vein thrombosis seen in the left lower extremity, from the common femoral through the popliteal veins. - No evidence of superficial venous thrombosis in the left lower extremity. - There is no evidence of significant venous reflux seen in the left lower extremity. - No evidence of superficial venous reflux seen in the left short saphenous vein. The left great saphenous vein was not visualized past the sapheno-femoral junction due to prior ablation/stripping.  *See table(s) above for measurements  and observations. Electronically signed by Kathlyn Sacramento MD on 02/19/2022 at 12:37:46 PM.    Final     [facility] 03/2018 DATE OF PROCEDURE: 04/05/2018  PRE-PROCEDURE DIAGNOSIS: Positive stress test  POST-PROCEDURE DIAGNOSIS: Normal coronary arteries  PROCEDURES PERFORMED:  1. Left heart catheterization 2. Coronary angiography  HEMOSTASIS: Coronary Findings Diagnostic Dominance: Right Left Main: Normal  Left Anterior Descending: Normal. The vessel birfurcates into a large second diagonal and the ongoing LAD. The ongoing LAD is small (<2.0 mm) amd extends to the apex.  Left Circumflex: Normal  Right Coronary Artery: Normal   Intervention No interventions have been documented.  Left Ventricle LV end diastolic pressure is normal.  ECHO 12/23/2021 ECHOCARDIOGRAPHIC MEASUREMENTS  2D DIMENSIONS  AORTA                  Values   Normal Range   MAIN PA         Values    Normal Range                Annulus: nm*          [2.1-2.5]         PA Main: nm*       [1.5-2.1]              Aorta Sin: nm*          [2.7-3.3]    RIGHT VENTRICLE            ST Junction:  nm*          [2.3-2.9]         RV Base: 2.9 cm    [<4.2]             Asc.Aorta: nm*          [2.3-3.1]          RV Mid: nm*       [<3.5]  LEFT VENTRICLE                                      RV Length: nm*       [<8.6]                 LVIDd: 4.6 cm       [3.9-5.3]    INFERIOR VENA CAVA                  LVIDs: 2.5 cm                        Max. IVC: nm*       [<=2.1]                    FS: 45.0 %       [>25]            Min. IVC: nm*                    SWT: 0.77 cm      [0.5-0.9]    ------------------                    PWT: 0.84 cm      [0.5-0.9]    nm* - not measured  LEFT ATRIUM                LA Diam: 3.9 cm       [2.7-3.8]  LA A4C Area: nm*          [<20]             LA Volume: nm*          [22-52]  _________________________________________________________________________________________  ECHOCARDIOGRAPHIC DESCRIPTIONS  AORTIC ROOT                   Size: Normal             Dissection: INDETERM FOR DISSECTION  AORTIC VALVE               Leaflets: Tricuspid                   Morphology: MILDLY THICKENED               Mobility: Fully mobile  LEFT VENTRICLE                   Size: Normal                        Anterior: Normal            Contraction: Normal                         Lateral: Normal             Closest EF: >55% (Estimated)                Septal: Normal              LV Masses: No Masses                       Apical: Normal                    LVH: None                          Inferior: Normal                                                      Posterior: Normal           Dias.FxClass: (Grade 1) relaxation abnormal, E/A reversal  MITRAL VALVE               Leaflets: Normal                        Mobility: Fully mobile             Morphology: THICKENED LEAFLET(S)  LEFT ATRIUM                   Size: Normal                       LA Masses: No masses              IA Septum: Normal IAS  MAIN PA                   Size: Normal  PULMONIC VALVE              Morphology: Normal  Mobility: Fully mobile  RIGHT VENTRICLE              RV Masses: No Masses                         Size: Normal              Free Wall: Normal                     Contraction: Normal  TRICUSPID VALVE               Leaflets: Normal                        Mobility: Fully mobile             Morphology: Normal  RIGHT ATRIUM                   Size: Normal                        RA Other: None                RA Mass: No masses  PERICARDIUM                 Fluid: No effusion  INFERIOR VENACAVA                   Size: Normal Normal respiratory collapse  _________________________________________________________________________________________   DOPPLER ECHO and OTHER SPECIAL PROCEDURES                 Aortic: TRIVIAL AR                 No AS                 Mitral: MILD MR                    No MS                         MV Inflow E Vel = 116.0 cm/sec    MV Annulus E'Vel = 11.9 cm/sec                         E/E'Ratio = 9.7              Tricuspid: MILD TR                    No TS                         267.0 cm/sec peak TR vel   31.5 mmHg peak RV pressure              Pulmonary: MILD PR                    No PS  _________________________________________________________________________________________  INTERPRETATION  NORMAL LEFT VENTRICULAR SYSTOLIC FUNCTION  NORMAL RIGHT VENTRICULAR SYSTOLIC FUNCTION  MILD VALVULAR REGURGITATION (See above)  NO VALVULAR STENOSIS   TELEMETRY reviewed by me (LT) 03/08/2022 : Atrial fibrillation in the 80s-90s at rest, paroxysms to the 110s-130s with conversation  EKG reviewed by me: Atrial fibrillation rate 89, nonspecific T wave changes  Data reviewed by me (LT) 03/08/2022: Last outpatient cardiology note from 10/2021, ED  note, admission H&P, CBC, BMP, troponins, TSH, A1c, last echo note.  Principal Problem:   Chest pain Active Problems:   Essential hypertension   Stage 3a chronic kidney disease (HCC)    Lymphedema   New onset atrial fibrillation (HCC)   CAD (coronary artery disease)   Asthma   Diarrhea   HLD (hyperlipidemia)   Obesity with body mass index (BMI) of 30.0 to 39.9    ASSESSMENT AND PLAN:   #New onset paroxysmal atrial fibrillation Mostly rate controlled at rest in the 80s to 90s.  Feels good without recurrent chest discomfort that radiated to her neck, upper back, and shoulders that she had prior to admission.  Strongly desires to go home today. -Continue amiodarone 200 mg p.o. twice daily x5 days, then 200 mg daily thereafter starting 10/28  -Increase atenolol from 25 mg once a day to 50 mg once a day for better rate control (notably intolerant of metoprolol) -Switch heparin drip over to Eliquis 5 mg twice daily for stroke risk reduction.  CHA2DS2-VASc is 65 (age, sex, HTN).  Discussed the risks and benefits of chronic anticoagulation with the patient and her significant other and she is in agreement.  Previously discussed future cardioversion after 3 weeks of appropriate anticoagulation to be set up on an outpatient basis if she remains in atrial fibrillation. -Discontinue aspirin, continue atorvastatin 20 mg once daily -Defer echo, consider repeat study on an outpatient basis, most recent echo was 2 months ago -She is okay for discharge from a cardiac standpoint today, follow-up with Dr. Saralyn Pilar in 1 to 2 weeks.  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 -Continue spironolactone 25 mg once a day  This patient's plan of care was discussed and created with Dr. Nehemiah Massed and he is in agreement.  Signed: Tristan Schroeder , PA-C 03/08/2022, 9:41 AM [facility] Card[facility]

## 2022-03-08 NOTE — Discharge Summary (Signed)
Physician Discharge Summary   Patient: Bonnie Crawford MRN: [id number] DOB: 12-06-1940  Admit date:     03/07/2022  Discharge date: 03/08/22  Discharge Physician: Fritzi Mandes   PCP: Idelle Crouch, MD   Recommendations at discharge:   follow-up Dr. Saralyn Pilar in 1 to 2 week follow-up Dr. Doy Hutching in one week  Discharge Diagnoses: Principal Problem:   Chest pain Active Problems:   CAD (coronary artery disease)   New onset atrial fibrillation ([place])   Essential hypertension   Stage 3a chronic kidney disease ([place])   Asthma   HLD (hyperlipidemia)   Diarrhea   Lymphedema   Obesity with body mass index (BMI) of 30.0 to 39.9  Hospital Course: Bonnie Crawford is a 81 y.o. female with medical history significant of factor V Leyden mutation, hypertension, hyperlipidemia, asthma, GERD, anxiety, DVT, PVC, MGUS, IBS, CKD-3, edema, who presents with chest pain and chest pressure.  Patient states that she has intermittent chest pressure and chest pain for almost 4 days.  The pressure is more than chest pain.  It is located in the front chest, mild to moderate, pressure feeling, radiating to the back. Patient was found to have new onset atrial fibrillation, with heart rate 60s-100s   Chest pain/pressure and hx of CAD: Initial troponin negative.  Patient has history of factor V Leiden mutation, at high risk of developing chronic clot.  - started IV heparin due to new onset atrial fibrillation -  CTA negative for PE --troponin x4 negative --no cp today   New onset atrial fibrillation ([place]): Heart rate 60s--100s.  CHADS2 score is 4. -Start IV heparin--changed to po eliquis per [facility] cardiology rec -Amiodarone 200 mg twice daily x 5 days and qd thereafter -Continue home atenolol but dose increased to 50 mg --bp stable --d/w pt and husband new dx --f/u dr Josefa Half in 1-2 weeks   Essential hypertension -IV hydralazine as needed -Continue atenolol -Continue home spironolactone   Stage 3a  chronic kidney disease ([place]): Renal function close to baseline.  Recent baseline creatinine 1.04 on 02/24/2022.  Her creatinine is 1.10, BUN 14, GFR 51 -stable   Asthma: Stable -Bronchodilators   HLD (hyperlipidemia) -Lipitor   Diarrhea -C. Difficile    Obesity with body mass index (BMI) of 30. 1 8 -Diet and exercise.   -Encourage to lose weight.   Overall see patient is hemodynamically stable feels better. Seen by cardiology okay to discharge. Patient and husband in agreement. All questions answered. Will discharged to home with outpatient cardiology follow-up.  DVT ppx: eliquis   Code Status: Full code   Family Communication: husband at bedside   Disposition Plan:  Anticipate discharge back to previous environment today       Consultants: [facility] CARDIOLOGY  Disposition: Home Diet recommendation:  Discharge Diet Orders (From admission, onward)     Start     Ordered   03/08/22 0000  Diet - low sodium heart healthy        03/08/22 1229           Cardiac diet DISCHARGE MEDICATION: Allergies as of 03/08/2022       Reactions   Metoprolol Succinate  [metoprolol Tartrate] Other (See Comments)   headache   Other Rash   "Dexatone"-kidney medication   Dexamethasone Rash        Medication List     STOP taking these medications    carbamide peroxide 6.5 % OTIC solution Commonly known as: DEBROX   carboxymethylcellulose 0.5 % Soln Commonly known  as: REFRESH PLUS   COLLAGEN PO   conjugated estrogens 0.625 MG/GM vaginal cream Commonly known as: PREMARIN   diphenhydrAMINE 25 MG tablet Commonly known as: BENADRYL   hydrochlorothiazide 25 MG tablet Commonly known as: HYDRODIURIL   Magnesium 500 MG Tabs   mirabegron ER 50 MG Tb24 tablet Commonly known as: MYRBETRIQ   neomycin-polymyxin-hydrocortisone OTIC solution Commonly known as: CORTISPORIN   predniSONE 10 MG (21) Tbpk tablet Commonly known as: STERAPRED UNI-PAK 21 TAB   Turmeric 500 MG Caps    Ubiquinol 100 MG Caps   Xiidra 5 % Soln Generic drug: Lifitegrast       TAKE these medications    acetaminophen 500 MG tablet Commonly known as: TYLENOL Take 500 mg by mouth every 6 (six) hours as needed.   amiodarone 200 MG tablet Commonly known as: PACERONE Take 1 tablet (200 mg total) by mouth 2 (two) times daily. For 5 days and then take 200 mg (1 tab) daily thereafter   apixaban 5 MG Tabs tablet Commonly known as: ELIQUIS Take 1 tablet (5 mg total) by mouth 2 (two) times daily.   aspirin EC 81 MG tablet Take 81 mg by mouth daily.   atenolol 50 MG tablet Commonly known as: TENORMIN Take 1 tablet (50 mg total) by mouth daily. Start taking on: March 09, 2022 What changed:  medication strength how much to take when to take this   atorvastatin 20 MG tablet Commonly known as: LIPITOR Take 20 mg by mouth daily.   Calcium Citrate-Vitamin D 315-250 MG-UNIT Tabs Take by mouth.   cetirizine 5 MG tablet Commonly known as: ZYRTEC Take 5 mg by mouth daily.   dextromethorphan-guaiFENesin 30-600 MG 12hr tablet Commonly known as: MUCINEX DM Take 1 tablet by mouth 2 (two) times daily.   GAS-X PO Take by mouth as needed.   KETO PO Take by mouth in the morning and at bedtime. Keto Strong and Keto Strong Detox   multivitamin tablet Take by mouth.   OMEGA-3 PLUS PO Take by mouth 3 (three) times a week.   omeprazole 20 MG capsule Commonly known as: PRILOSEC Take 20 mg by mouth daily as needed.   psyllium 58.6 % powder Commonly known as: METAMUCIL Take 1 packet by mouth daily as needed.   spironolactone 25 MG tablet Commonly known as: ALDACTONE Take 25 mg by mouth daily.   Vitamin D-1000 Max St 25 MCG (1000 UT) tablet Generic drug: Cholecalciferol Take by mouth.        Follow-up Information     Paraschos, Alexander, MD. Go in 1 week(s).   Specialty: Cardiology Contact information: [facility] [place]  [place] [address] [phone]         Idelle Crouch, MD. Schedule an appointment as soon as possible for a visit in 1 week(s).   Specialty: Internal Medicine Contact information: [place] [address] [phone]                Discharge Exam: Danley Danker Weights   03/07/22 1314  Weight: 74.8 kg     Condition at discharge: fair  The results of significant diagnostics from this hospitalization (including imaging, microbiology, ancillary and laboratory) are listed below for reference.   Imaging Studies: CT Angio Chest Pulmonary Embolism (PE) W or WO Contrast  Result Date: 03/07/2022 CLINICAL DATA:  Pulmonary embolus suspected with high probability. Tired feeling and chest tightness intermittently for 2-1/2 days. EXAM: CT ANGIOGRAPHY CHEST WITH  CONTRAST TECHNIQUE: Multidetector CT imaging of the chest was performed using the standard protocol during bolus administration of intravenous contrast. Multiplanar CT image reconstructions and MIPs were obtained to evaluate the vascular anatomy. RADIATION DOSE REDUCTION: This exam was performed according to the departmental dose-optimization program which includes automated exposure control, adjustment of the mA and/or kV according to patient size and/or use of iterative reconstruction technique. CONTRAST:  88m OMNIPAQUE IOHEXOL 350 MG/ML SOLN COMPARISON:  None Available. FINDINGS: Cardiovascular: Good opacification of the central and segmental pulmonary arteries. No focal filling defects. No evidence of significant pulmonary embolus. Normal heart size. No pericardial effusions. Normal caliber thoracic aorta. Calcification of the aorta and coronary arteries. Mediastinum/Nodes: Thyroid gland is unremarkable. Esophagus is decompressed. No significant lymphadenopathy. Lungs/Pleura: Motion artifact limits examination. Patchy mosaic attenuation pattern to the lungs could represent motion artifact, air trapping,  or edema. Focal nodule in the right lower lung, series five image 77, measuring 6 mm diameter. No pleural effusions. No pneumothorax. Upper Abdomen: Gallbladder is surgically absent. No acute abnormalities. Musculoskeletal: Degenerative changes.  No acute bony abnormalities. Review of the MIP images confirms the above findings. IMPRESSION: 1. No evidence of pulmonary embolus. 2. Patchy mosaic attenuation pattern to the lungs may represent motion artifact, air trapping, or edema. 3. 6 mm right solid pulmonary nodule. Per Fleischner Society Guidelines, recommend a non-contrast Chest CT at 6-12 months. If patient is high risk for malignancy, consider an additional non-contrast Chest CT at 18-24 months. If patient is low risk for malignancy, non-contrast Chest CT at 18-24 months is optional. These guidelines do not apply to immunocompromised patients and patients with cancer. Follow up in patients with significant comorbidities as clinically warranted. For lung cancer screening, adhere to Lung-RADS guidelines. Reference: Radiology. 2017; 284(1):228-43. Electronically Signed   By: WLucienne CapersM.D.   On: 03/07/2022 19:20   DG Chest 2 View  Result Date: 03/07/2022 CLINICAL DATA:  Chest pain. EXAM: CHEST - 2 VIEW COMPARISON:  None Available. FINDINGS: The heart size and mediastinal contours are within normal limits. Both lungs are clear. The visualized skeletal structures are unremarkable. IMPRESSION: No active cardiopulmonary disease. Electronically Signed   By: JMarijo ConceptionM.D.   On: 03/07/2022 13:54   VAS [place]OWER EXTREMITY VENOUS REFLUX  Result Date: 02/19/2022  Lower Venous Reflux Study Patient Name:  ELIN HACKMANN Date of Exam:   02/16/2022 Medical Rec #: [id number]        Accession #:    [id number] of Birth: [birth year]       Patient Gender: F Patient Age:   89years Exam Location:  [facility] Procedure:      VAS [place]OWER EXTREMITY VENOUS REFLUX Referring Phys: CHarrell GaveDICKSON  --------------------------------------------------------------------------------  Other Indications: Patient presents with chronic leg swelling and lymphedema.                    She feels that the left leg gets more swollen. The right leg                    she has more tingly/prickly sensations. Comparison Study: Previous bilateral venous reflux study performed at [facility]                   04/28/2020 showed deep venous reflux in the right common                   femoral vein, femoral vein and popliteal vein. On the  left                   there was deep reflux in the left common femoral vein. There                   is a history of bilateral GSV stripping/ablation. Performing Technologist: Mariane Masters RVT  Examination Guidelines: A complete evaluation includes B-mode imaging, spectral Doppler, color Doppler, and power Doppler as needed of all accessible portions of each vessel. Bilateral testing is considered an integral part of a complete examination. Limited examinations for reoccurring indications may be performed as noted. The reflux portion of the exam is performed with the patient in reverse Trendelenburg. Significant venous reflux is defined as >500 ms in the superficial venous system, and >1 second in the deep venous system.  +------------------+---------+------+-----------+------------+-----------------+ LEFT              Reflux NoRefluxReflux TimeDiameter cmsComments                                      Yes                                           +------------------+---------+------+-----------+------------+-----------------+ CFV               no                                                      +------------------+---------+------+-----------+------------+-----------------+ FV prox           no                                                      +------------------+---------+------+-----------+------------+-----------------+ FV mid            no                                                       +------------------+---------+------+-----------+------------+-----------------+ FV dist           no               983 ms                                 +------------------+---------+------+-----------+------------+-----------------+ Popliteal         no                                                      +------------------+---------+------+-----------+------------+-----------------+ GSV at SFJ        no                                                      +------------------+---------+------+-----------+------------+-----------------+  GSV prox thigh                                          prior                                                                     ablation/strippin                                                         g                 +------------------+---------+------+-----------+------------+-----------------+ GSV mid thigh                                           prior                                                                     ablation/strippin                                                         g                 +------------------+---------+------+-----------+------------+-----------------+ GSV dist thigh                                          prior                                                                     ablation/strippin                                                         g                 +------------------+---------+------+-----------+------------+-----------------+ GSV at knee  prior                                                                     ablation/strippin                                                         g                 +------------------+---------+------+-----------+------------+-----------------+ GSV prox calf                                            prior                                                                     ablation/strippin                                                         g                 +------------------+---------+------+-----------+------------+-----------------+ SSV prox calf     no                            0.19                      +------------------+---------+------+-----------+------------+-----------------+ Anterior accessoryno                            0.39                      branch                                                                    +------------------+---------+------+-----------+------------+-----------------+   Summary: Left: - No evidence of deep vein thrombosis seen in the left lower extremity, from the common femoral through the popliteal veins. - No evidence of superficial venous thrombosis in the left lower extremity. - There is no evidence of significant venous reflux seen in the left lower extremity. - No evidence of superficial venous reflux seen in the left short saphenous vein. The left great saphenous vein was not visualized past the sapheno-femoral junction due to prior ablation/stripping.  *See table(s) above for measurements  and observations. Electronically signed by Kathlyn Sacramento MD on 02/19/2022 at 12:37:46 PM.    Final     Microbiology: Results for orders placed or performed during the hospital encounter of 03/07/22  SARS Coronavirus 2 by RT PCR (hospital order, performed in [facility] hospital lab) *cepheid single result test* Anterior Nasal Swab     Status: None   Collection Time: 03/07/22  1:24 PM   Specimen: Anterior Nasal Swab  Result Value Ref Range Status   SARS Coronavirus 2 by RT PCR NEGATIVE NEGATIVE Final    Comment: (NOTE) SARS-CoV-2 target nucleic acids are NOT DETECTED.  The SARS-CoV-2 RNA is generally detectable in upper and lower respiratory specimens during the acute phase of infection. The  lowest concentration of SARS-CoV-2 viral copies this assay can detect is 250 copies / mL. A negative result does not preclude SARS-CoV-2 infection and should not be used as the sole basis for treatment or other patient management decisions.  A negative result may occur with improper specimen collection / handling, submission of specimen other than nasopharyngeal swab, presence of viral mutation(s) within the areas targeted by this assay, and inadequate number of viral copies (<250 copies / mL). A negative result must be combined with clinical observations, patient history, and epidemiological information.  Fact Sheet for Patients:   https://www.Bijal Siglin.info/  Fact Sheet for Healthcare Providers: https://hall.com/  This test is not yet approved or  cleared by the [place] FDA and has been authorized for detection and/or diagnosis of SARS-CoV-2 by FDA under an Emergency Use Authorization (EUA).  This EUA will remain in effect (meaning this test can be used) for the duration of the COVID-19 declaration under Section 564(b)(1) of the Act, 21 U.S.C. section 360bbb-3(b)(1), unless the authorization is terminated or revoked sooner.  Performed at [facility], [address] [place], [place] [address]     Labs: CBC: Recent Labs  Lab 03/07/22 1426 03/08/22 0410  WBC 8.1 8.4  HGB 16.4* 12.8  HCT 51.4* 40.3  MCV 89.9 91.6  PLT 349 828   Basic Metabolic Panel: Recent Labs  Lab 03/07/22 1324 03/08/22 0410  NA 140 143  K 4.3 3.6  CL 110 117*  CO2 25 20*  GLUCOSE 110* 91  BUN 14 18  CREATININE 1.10* 0.93  CALCIUM 9.6 7.4*   Liver Function Tests: Recent Labs  Lab 03/07/22 1426  AST 28  ALT 22  ALKPHOS 59  BILITOT 1.0  PROT 7.5  ALBUMIN 4.0   CBG: No results for input(s): "GLUCAP" in the last 168 hours.  Discharge time spent: greater than 30 minutes.  Signed: Fritzi Mandes, MD [facility] 03/08/2022

## 2022-03-08 NOTE — TOC Benefit Eligibility Note (Signed)
Patient Teacher, English as a foreign language completed.    The patient is currently admitted and upon discharge could be taking Eliquis 5 mg.  The current 30 day co-pay is $45.00.   The patient is insured through [place], [place] Patient Advocate Specialist [place] Patient Advocate Team Direct Number: [phone]  Fax: [phone]

## 2022-03-10 ENCOUNTER — Encounter: Payer: Medicare HMO | Attending: Internal Medicine | Admitting: Dietician

## 2022-03-10 ENCOUNTER — Encounter: Payer: Self-pay | Admitting: Dietician

## 2022-03-10 VITALS — Ht 62.0 in | Wt 167.3 lb

## 2022-03-10 DIAGNOSIS — E669 Obesity, unspecified: Secondary | ICD-10-CM

## 2022-03-10 DIAGNOSIS — Z713 Dietary counseling and surveillance: Secondary | ICD-10-CM | POA: Insufficient documentation

## 2022-03-10 DIAGNOSIS — I10 Essential (primary) hypertension: Secondary | ICD-10-CM | POA: Diagnosis not present

## 2022-03-10 DIAGNOSIS — N1831 Chronic kidney disease, stage 3a: Secondary | ICD-10-CM | POA: Insufficient documentation

## 2022-03-10 DIAGNOSIS — E785 Hyperlipidemia, unspecified: Secondary | ICD-10-CM | POA: Insufficient documentation

## 2022-03-10 NOTE — Progress Notes (Signed)
Medical Nutrition Therapy: Visit start time: 0915  end time: 1025  Assessment:   Referral Diagnosis: CKD stage 3b, hypertension, hyperlipidemia Other medical history/ diagnoses: atrial fibrillation (ED 03/07/22), diverticulosis, IBS, GERD, osteoporosis Psychosocial issues/ stress concerns: none  Medications, supplements: reconciled list in medical record   Preferred learning method:  Visual    Current weight: 167.3lbs Height: 5'2" BMI: 30.6 Patient's personal weight goal: 155lbs  Progress and evaluation:  Patient reports concern over a-fib; she was in ED with episode several days ago.  She has been making dietary changes to reduce sodium intake ie decrease canned veg, limiting portion of bacon/ processed meats. She reports weight gain of about 15lbs since moving from [place] 1-2 years ago and would like to lose that weight. Recent labs indicate GFR of 60, BUN 18, creatinine 0.93, calcium low at 7.4, chloride elevated at 117, sodium, potassium normal (03/08/22); GFR 51, BUN 14, creatinine 1.10, electrolytes normal (03/07/22),  54, creatinine 1.04, BUN 16, electrolytes normal (02/24/22), GFR 50, BUN 16, creatinine 1.11, electrolytes normal (01/26/22); HbA1C 5.7% (03/07/22); total cholesterol 116, HDL 41, LDL 62, triglycerides 67 (03/08/22) Food allergies: none Special diet practices: none Patient seeks help with protecting kidney health and heart health Next PCP appt is 03/18/22   Dietary Intake:  Usual eating pattern includes 3 meals and 2 snacks per day. Dining out frequency: 2-3 meals per week -- usually eats 1/2 meal Who plans meals/ buys groceries? self Who prepares meals? self  Breakfast: 1 sl toast (white bread, husband does not like whole grain), 1 egg, 1 slice bacon, applesauce; frozen waffle/ pancake with sugar free syrup; instant oatmeal maple br sugar; canned bisuit with gravy; not much cold cereal Snack: none Lunch: bologna (1 sl) sandwich on 1 slice bread; tuna or  chicken salad; occ hot dog + fruit (no sugar added) canned Snack: "Chewy" snack bar/ peanut butter crackers "Nabs" Supper: instant mashed potatoes, frozen/ canned veg; soup/ chili; frozen; out 2-3x a week --village Forensic psychologist, salad/ chicken sancwich/ baked potato, steamed veg; [place]/ olive garden -- often soup/ red lobster Snack: grapes/ blueberries/ "Chewy" snack bar Beverages: mostly water, iced tea, cranberry juice diet in am, coffee in am, planning to switch to decaf  Physical activity: not currently due to recent a-fib, does not like going to gym, plans to start on stationary bike at home when a-fib is controlled   Intervention:   Nutrition Care Education:   Basic nutrition: basic food groups; appropriate nutrient balance; appropriate meal and snack schedule; general nutrition guidelines    Weight control: importance of low sugar and low fat choices; appropriate food portions; estimated energy needs for weight loss at 1400 kcal, provided guidance for 50% CHO, 20% pro, (30% fat) Advanced nutrition: dining out; food label reading for sodium CKD: controlling protein intake and daily goal for protein; discussed appropriate meat/protein portions; importance of low sodium diet, goal/ limit for daily sodium intake, lower sodium food options -- especially canned foods, processed meats, instant foods, fried foods, processed meats, restaurant meals; importance of controlling BP including consuming plenty of veg and fruits Hyperlipidemia: healthy and unhealthy fats; veg and fruit intake   Other intervention notes: Patient has been making some diet changes and is motivated to continue.  Established goals for additional change with input from patient.  She declined scheduling follow up at this time, but will schedule later if needed. Encouraged her to call with any questions or concerns.   Nutritional Diagnosis:  [place]-2.1 Inpaired nutrition utilization and  [place]-2.2 Altered nutrition-related  laboratory As related to chronic kidney disease, hypertension, hyperlipidemia.  As evidenced by decreased GFR, BP and cholesterol controlled with medications. [place]-3.3 Overweight/obesity As related to decreased physical activity, stress related to recent move, excess calories.  As evidenced by patient with current BMI of 30.6.   Education Materials given:  Museum/gallery conservator with food lists, sample meal pattern Sample menus Low Sodium Nutrition Therapy (NCM) Visit summary with goals/ instructions   Learner/ who was taught:  Patient  Spouse/ partner  Level of understanding: Verbalizes/ demonstrates competency   Demonstrated degree of understanding via:   Teach back Learning barriers: None  Willingness to learn/ readiness for change: Eager, change in progress   Monitoring and Evaluation:  Dietary intake, exercise, renal function, HTN, blood lipids, and body weight      follow up: prn

## 2022-03-10 NOTE — Patient Instructions (Addendum)
Keep portions of meats to palm-size (3oz) or less. Choose grilled, baked, roasted rather than deep fried and breaded Check food labels for sodium, aim for average of '500mg'$  for each meal, less for snacks. Include a vegetable and/or fruit with every meal, veggies and fruits help control blood pressure which can prevent progression of kidney disease

## 2022-03-15 ENCOUNTER — Encounter (INDEPENDENT_AMBULATORY_CARE_PROVIDER_SITE_OTHER): Payer: Self-pay

## 2022-03-18 DIAGNOSIS — Z87891 Personal history of nicotine dependence: Secondary | ICD-10-CM | POA: Diagnosis not present

## 2022-03-18 DIAGNOSIS — D6851 Activated protein C resistance: Secondary | ICD-10-CM | POA: Diagnosis not present

## 2022-03-18 DIAGNOSIS — I129 Hypertensive chronic kidney disease with stage 1 through stage 4 chronic kidney disease, or unspecified chronic kidney disease: Secondary | ICD-10-CM | POA: Diagnosis not present

## 2022-03-18 DIAGNOSIS — Z23 Encounter for immunization: Secondary | ICD-10-CM | POA: Diagnosis not present

## 2022-03-18 DIAGNOSIS — I4719 Other supraventricular tachycardia: Secondary | ICD-10-CM | POA: Diagnosis not present

## 2022-03-18 DIAGNOSIS — E785 Hyperlipidemia, unspecified: Secondary | ICD-10-CM | POA: Diagnosis not present

## 2022-03-18 DIAGNOSIS — I890 Lymphedema, not elsewhere classified: Secondary | ICD-10-CM | POA: Diagnosis not present

## 2022-03-18 DIAGNOSIS — Z0000 Encounter for general adult medical examination without abnormal findings: Secondary | ICD-10-CM | POA: Diagnosis not present

## 2022-03-18 DIAGNOSIS — D472 Monoclonal gammopathy: Secondary | ICD-10-CM | POA: Diagnosis not present

## 2022-03-22 DIAGNOSIS — R079 Chest pain, unspecified: Secondary | ICD-10-CM | POA: Diagnosis not present

## 2022-03-22 DIAGNOSIS — Z1211 Encounter for screening for malignant neoplasm of colon: Secondary | ICD-10-CM | POA: Diagnosis not present

## 2022-03-22 DIAGNOSIS — R0609 Other forms of dyspnea: Secondary | ICD-10-CM | POA: Diagnosis not present

## 2022-03-22 DIAGNOSIS — I10 Essential (primary) hypertension: Secondary | ICD-10-CM | POA: Diagnosis not present

## 2022-03-22 DIAGNOSIS — I480 Paroxysmal atrial fibrillation: Secondary | ICD-10-CM | POA: Diagnosis not present

## 2022-03-25 DIAGNOSIS — Z1211 Encounter for screening for malignant neoplasm of colon: Secondary | ICD-10-CM | POA: Diagnosis not present

## 2022-03-29 ENCOUNTER — Other Ambulatory Visit: Payer: Self-pay | Admitting: Cardiology

## 2022-03-29 DIAGNOSIS — I480 Paroxysmal atrial fibrillation: Secondary | ICD-10-CM

## 2022-03-29 DIAGNOSIS — I10 Essential (primary) hypertension: Secondary | ICD-10-CM

## 2022-03-29 DIAGNOSIS — R0609 Other forms of dyspnea: Secondary | ICD-10-CM

## 2022-03-29 DIAGNOSIS — R079 Chest pain, unspecified: Secondary | ICD-10-CM

## 2022-04-07 ENCOUNTER — Telehealth (HOSPITAL_COMMUNITY): Payer: Self-pay | Admitting: *Deleted

## 2022-04-07 NOTE — Telephone Encounter (Signed)
Reaching out to patient to offer assistance regarding upcoming cardiac imaging study; pt verbalizes understanding of appt date/time, parking situation and where to check in, and verified current allergies; name and call back number provided for further questions should they arise  Gordy Clement RN Navigator Cardiac Imaging [facility] [phone] office [phone] cell  Patient to take '100mg'$  atenolol two hours prior to her cardiac CT scan. She will hold her Zyrtec and Spironolactone prior to test.

## 2022-04-07 NOTE — Telephone Encounter (Signed)
Attempted to call patient regarding upcoming cardiac CT appointment. °Left message on voicemail with name and callback number ° °Gesselle Fitzsimons RN Navigator Cardiac Imaging °[facility] °[phone] Office °[phone] Cell ° °

## 2022-04-12 ENCOUNTER — Ambulatory Visit
Admission: RE | Admit: 2022-04-12 | Discharge: 2022-04-12 | Disposition: A | Discharge status: Home / Self Care | Payer: Medicare HMO | Source: Ambulatory Visit | Attending: Cardiology | Admitting: Cardiology

## 2022-04-12 DIAGNOSIS — R0609 Other forms of dyspnea: Secondary | ICD-10-CM | POA: Diagnosis not present

## 2022-04-12 DIAGNOSIS — I480 Paroxysmal atrial fibrillation: Secondary | ICD-10-CM | POA: Diagnosis not present

## 2022-04-12 DIAGNOSIS — I2510 Atherosclerotic heart disease of native coronary artery without angina pectoris: Secondary | ICD-10-CM

## 2022-04-12 DIAGNOSIS — I10 Essential (primary) hypertension: Secondary | ICD-10-CM | POA: Diagnosis not present

## 2022-04-12 DIAGNOSIS — R079 Chest pain, unspecified: Secondary | ICD-10-CM | POA: Insufficient documentation

## 2022-04-12 HISTORY — DX: Atherosclerotic heart disease of native coronary artery without angina pectoris: I25.10

## 2022-04-12 MED ORDER — NITROGLYCERIN 0.4 MG SL SUBL
0.8000 mg | SUBLINGUAL_TABLET | Freq: Once | SUBLINGUAL | Status: AC
  Administered 2022-04-12: 0.8 mg via SUBLINGUAL

## 2022-04-12 MED ORDER — NITROGLYCERIN 0.4 MG SL SUBL
SUBLINGUAL_TABLET | SUBLINGUAL | Status: AC
  Filled 2022-04-12: qty 2

## 2022-04-12 MED ORDER — IOHEXOL 350 MG/ML SOLN
100.0000 mL | Freq: Once | INTRAVENOUS | Status: AC | PRN
  Administered 2022-04-12: 100 mL via INTRAVENOUS

## 2022-04-12 MED ORDER — DILTIAZEM HCL 25 MG/5ML IV SOLN: 10.0000 mg | Freq: Once | INTRAVENOUS | Status: AC

## 2022-04-12 MED ORDER — DILTIAZEM HCL 25 MG/5ML IV SOLN
INTRAVENOUS | Status: AC
  Administered 2022-04-12: 10 mg via INTRAVENOUS
  Filled 2022-04-12: qty 5

## 2022-04-12 NOTE — Progress Notes (Signed)
Patient tolerated procedure well. Ambulate w/o difficulty. Denies any lightheadedness or being dizzy. Pt denies any pain at this time. Sitting in chair, pt is encouraged to drink additional water throughout the day and reason explained to patient. Patient verbalized understanding and all questions answered. ABC intact. No further needs at this time. Discharge from procedure area w/o issues.  

## 2022-04-14 DIAGNOSIS — I4821 Permanent atrial fibrillation: Secondary | ICD-10-CM | POA: Diagnosis not present

## 2022-04-20 DIAGNOSIS — I10 Essential (primary) hypertension: Secondary | ICD-10-CM | POA: Diagnosis not present

## 2022-04-20 DIAGNOSIS — D472 Monoclonal gammopathy: Secondary | ICD-10-CM | POA: Diagnosis not present

## 2022-04-20 DIAGNOSIS — E785 Hyperlipidemia, unspecified: Secondary | ICD-10-CM | POA: Diagnosis not present

## 2022-04-20 DIAGNOSIS — I4719 Other supraventricular tachycardia: Secondary | ICD-10-CM | POA: Diagnosis not present

## 2022-04-20 DIAGNOSIS — I890 Lymphedema, not elsewhere classified: Secondary | ICD-10-CM | POA: Diagnosis not present

## 2022-04-20 DIAGNOSIS — N1831 Chronic kidney disease, stage 3a: Secondary | ICD-10-CM | POA: Diagnosis not present

## 2022-04-20 DIAGNOSIS — I4821 Permanent atrial fibrillation: Secondary | ICD-10-CM | POA: Diagnosis not present

## 2022-04-20 DIAGNOSIS — I2510 Atherosclerotic heart disease of native coronary artery without angina pectoris: Secondary | ICD-10-CM | POA: Diagnosis not present

## 2022-05-04 ENCOUNTER — Encounter
Admission: RE | Disposition: A | Discharge status: Home / Self Care | Payer: Self-pay | Source: Home / Self Care | Attending: Cardiology

## 2022-05-04 ENCOUNTER — Other Ambulatory Visit: Payer: Self-pay

## 2022-05-04 ENCOUNTER — Ambulatory Visit
Admission: RE | Admit: 2022-05-04 | Discharge: 2022-05-04 | Disposition: A | Discharge status: Home / Self Care | Payer: Medicare HMO | Attending: Cardiology | Admitting: Cardiology

## 2022-05-04 ENCOUNTER — Ambulatory Visit: Payer: Medicare HMO | Admitting: Certified Registered Nurse Anesthetist

## 2022-05-04 ENCOUNTER — Encounter: Payer: Self-pay | Admitting: Cardiology

## 2022-05-04 DIAGNOSIS — E785 Hyperlipidemia, unspecified: Secondary | ICD-10-CM | POA: Diagnosis not present

## 2022-05-04 DIAGNOSIS — I493 Ventricular premature depolarization: Secondary | ICD-10-CM | POA: Diagnosis not present

## 2022-05-04 DIAGNOSIS — R001 Bradycardia, unspecified: Secondary | ICD-10-CM | POA: Insufficient documentation

## 2022-05-04 DIAGNOSIS — I10 Essential (primary) hypertension: Secondary | ICD-10-CM | POA: Insufficient documentation

## 2022-05-04 DIAGNOSIS — K219 Gastro-esophageal reflux disease without esophagitis: Secondary | ICD-10-CM | POA: Insufficient documentation

## 2022-05-04 DIAGNOSIS — I890 Lymphedema, not elsewhere classified: Secondary | ICD-10-CM | POA: Diagnosis not present

## 2022-05-04 DIAGNOSIS — I4891 Unspecified atrial fibrillation: Secondary | ICD-10-CM | POA: Insufficient documentation

## 2022-05-04 DIAGNOSIS — J45909 Unspecified asthma, uncomplicated: Secondary | ICD-10-CM | POA: Insufficient documentation

## 2022-05-04 DIAGNOSIS — Z87442 Personal history of urinary calculi: Secondary | ICD-10-CM | POA: Insufficient documentation

## 2022-05-04 DIAGNOSIS — D6851 Activated protein C resistance: Secondary | ICD-10-CM | POA: Diagnosis not present

## 2022-05-04 DIAGNOSIS — Z87891 Personal history of nicotine dependence: Secondary | ICD-10-CM | POA: Diagnosis not present

## 2022-05-04 DIAGNOSIS — Z7901 Long term (current) use of anticoagulants: Secondary | ICD-10-CM | POA: Diagnosis not present

## 2022-05-04 DIAGNOSIS — F419 Anxiety disorder, unspecified: Secondary | ICD-10-CM | POA: Insufficient documentation

## 2022-05-04 DIAGNOSIS — Z7982 Long term (current) use of aspirin: Secondary | ICD-10-CM | POA: Diagnosis not present

## 2022-05-04 DIAGNOSIS — I2510 Atherosclerotic heart disease of native coronary artery without angina pectoris: Secondary | ICD-10-CM | POA: Insufficient documentation

## 2022-05-04 DIAGNOSIS — I480 Paroxysmal atrial fibrillation: Secondary | ICD-10-CM | POA: Diagnosis not present

## 2022-05-04 HISTORY — PX: CARDIOVERSION: SHX1299

## 2022-05-04 HISTORY — DX: Unspecified atrial fibrillation: I48.91

## 2022-05-04 SURGERY — CARDIOVERSION
Anesthesia: General

## 2022-05-04 MED ORDER — PROPOFOL 10 MG/ML IV BOLUS
INTRAVENOUS | Status: DC | PRN
  Administered 2022-05-04: 50 mg via INTRAVENOUS

## 2022-05-04 MED ORDER — SODIUM CHLORIDE 0.9 % IV SOLN: INTRAVENOUS | Status: DC

## 2022-05-04 NOTE — Anesthesia Procedure Notes (Signed)
Date/Time: 05/04/2022 7:31 AM  Performed by: Lily Peer, Azizi Bally, CRNAPre-anesthesia Checklist: Patient identified, Emergency Drugs available, Suction available, Patient being monitored and Timeout performed Patient Re-evaluated:Patient Re-evaluated prior to induction Oxygen Delivery Method: Nasal cannula Induction Type: IV induction

## 2022-05-04 NOTE — Anesthesia Postprocedure Evaluation (Signed)
Anesthesia Post Note  Patient: Bonnie Crawford  Procedure(s) Performed: CARDIOVERSION  Patient location during evaluation: PACU Anesthesia Type: General Level of consciousness: awake and alert Pain management: pain level controlled Vital Signs Assessment: post-procedure vital signs reviewed and stable Respiratory status: spontaneous breathing, nonlabored ventilation, respiratory function stable and patient connected to nasal cannula oxygen Cardiovascular status: blood pressure returned to baseline and stable Postop Assessment: no apparent nausea or vomiting Anesthetic complications: no   No notable events documented.   Last Vitals:  Vitals:   05/04/22 0752 05/04/22 0800  BP: 121/62 102/60  Pulse: (!) 45 (!) 45  Resp: 19 17  Temp:    SpO2: 97% 98%    Last Pain:  Vitals:   05/04/22 0707  TempSrc: Oral  PainSc: 0-No pain                 Molli Barrows

## 2022-05-04 NOTE — Anesthesia Preprocedure Evaluation (Signed)
Anesthesia Evaluation  Patient identified by MRN, date of birth, ID band Patient awake    Reviewed: Allergy & Precautions, H&P , NPO status , Patient's Chart, lab work & pertinent test results, reviewed documented beta blocker date and time   Airway Mallampati: II   Neck ROM: full    Dental  (+) Poor Dentition   Pulmonary asthma , former smoker   Pulmonary exam normal        Cardiovascular Exercise Tolerance: Poor hypertension, On Medications + CAD  Atrial Fibrillation  Rhythm:regular Rate:Normal     Neuro/Psych   Anxiety     negative neurological ROS  negative psych ROS   GI/Hepatic Neg liver ROS,GERD  Medicated,,  Endo/Other  negative endocrine ROS    Renal/GU Renal disease  negative genitourinary   Musculoskeletal   Abdominal   Peds  Hematology negative hematology ROS (+)   Anesthesia Other Findings Past Medical History: No date: Anxiety No date: Arthritis No date: Asthma     Comment:  childhood No date: Atrial fibrillation [facility])     Comment:  Noted in October 2023 No date: Chronic kidney disease     Comment:  Renal Insufficiency No date: Colon polyp No date: Diverticulosis No date: Factor V Leiden ([place]) No date: GERD (gastroesophageal reflux disease) No date: Herpes zoster No date: History of kidney stones No date: Hyperlipidemia No date: Hypertension No date: Inflammatory bowel disease No date: MGUS (monoclonal gammopathy of unknown significance) No date: Osteoporosis No date: PAT (paroxysmal atrial tachycardia) No date: Sinus trouble No date: URI (upper respiratory infection)     Comment:  2 months ago Past Surgical History: No date: ABDOMINAL HYSTERECTOMY No date: APPENDECTOMY No date: BLEPHAROPLASTY; Bilateral No date: BREAST CYST ASPIRATION; Left     Comment:  neg No date: CARDIAC CATHETERIZATION 07/28/2020: CATARACT EXTRACTION W/PHACO; Left     Comment:  Procedure: CATARACT EXTRACTION  PHACO AND INTRAOCULAR               LENS PLACEMENT (IOC) LEFT PANOPTIX TORIC 4.25 00:30.9;                Surgeon: Eulogio Bear, MD;  Location: [place];  Service: Ophthalmology;  Laterality: Left; 08/18/2020: CATARACT EXTRACTION W/PHACO; Right     Comment:  Procedure: CATARACT EXTRACTION PHACO AND INTRAOCULAR               LENS PLACEMENT (IOC) RIGHT PANOPTIX TORIC 2.31 00:20.0;                Surgeon: Eulogio Bear, MD;  Location: [place];  Service: Ophthalmology;  Laterality:               Right; No date: COLONOSCOPY No date: KNEE SURGERY; Left     Comment:  2 meniscus tears No date: NECK SURGERY     Comment:  cosmetic BMI    Body Mass Index: 29.70 kg/m     Reproductive/Obstetrics negative OB ROS                             Anesthesia Physical Anesthesia Plan  ASA: 4  Anesthesia Plan: General   Post-op Pain Management:    Induction:   PONV Risk Score and Plan:   Airway Management Planned:   Additional  Equipment:   Intra-op Plan:   Post-operative Plan:   Informed Consent: I have reviewed the patients History and Physical, chart, labs and discussed the procedure including the risks, benefits and alternatives for the proposed anesthesia with the patient or authorized representative who has indicated his/her understanding and acceptance.     Dental Advisory Given  Plan Discussed with: CRNA  Anesthesia Plan Comments:        Anesthesia Quick Evaluation

## 2022-05-04 NOTE — Op Note (Signed)
[facility]   05/04/2022                     8:01 AM  PATIENT:  Bonnie Crawford    PRE-OPERATIVE DIAGNOSIS:  Cardioversion   Afib  POST-OPERATIVE DIAGNOSIS:  Same  PROCEDURE:  CARDIOVERSION  SURGEON:  Isaias Cowman, MD    ANESTHESIA:     PREOPERATIVE INDICATIONS:  Bonnie Crawford is a  81 y.o. female with a diagnosis of Cardioversion   Afib who failed conservative measures and elected for surgical management.    The risks benefits and alternatives were discussed with the patient preoperatively including but not limited to the risks of infection, bleeding, cardiopulmonary complications, the need for revision surgery, among others, and the patient was willing to proceed.   OPERATIVE PROCEDURE: Patient presented in the fasting state.  ECG revealed atrial fibrillation at 80 bpm.  He received 50 mg propofol.  Patient underwent successful cardioversion with  75 J to normal sinus rhythm.  There were no periprocedural complications.

## 2022-05-04 NOTE — Transfer of Care (Signed)
Immediate Anesthesia Transfer of Care Note  Patient: Bonnie Crawford  Procedure(s) Performed: CARDIOVERSION  Patient Location: Cath Lab  Anesthesia Type:General  Level of Consciousness: awake, alert , and oriented  Airway & Oxygen Therapy: Patient Spontanous Breathing and Patient connected to nasal cannula oxygen  Post-op Assessment: Report given to RN and Post -op Vital signs reviewed and stable  Post vital signs: Reviewed and stable  Last Vitals:  Vitals Value Taken Time  BP 133/80 05/04/22 0747  Temp    Pulse 49 05/04/22 0749  Resp 18 05/04/22 0749  SpO2 96 % 05/04/22 0749  Vitals shown include unvalidated device data.  Last Pain:  Vitals:   05/04/22 0707  TempSrc: Oral  PainSc: 0-No pain         Complications: No notable events documented.

## 2022-05-05 ENCOUNTER — Encounter: Payer: Self-pay | Admitting: Cardiology

## 2022-05-12 DIAGNOSIS — I10 Essential (primary) hypertension: Secondary | ICD-10-CM | POA: Diagnosis not present

## 2022-05-12 DIAGNOSIS — I4821 Permanent atrial fibrillation: Secondary | ICD-10-CM | POA: Diagnosis not present

## 2022-05-12 DIAGNOSIS — D6851 Activated protein C resistance: Secondary | ICD-10-CM | POA: Diagnosis not present

## 2022-05-12 DIAGNOSIS — I4719 Other supraventricular tachycardia: Secondary | ICD-10-CM | POA: Diagnosis not present

## 2022-05-12 DIAGNOSIS — I890 Lymphedema, not elsewhere classified: Secondary | ICD-10-CM | POA: Diagnosis not present

## 2022-05-12 DIAGNOSIS — D472 Monoclonal gammopathy: Secondary | ICD-10-CM | POA: Diagnosis not present

## 2022-05-12 DIAGNOSIS — I2510 Atherosclerotic heart disease of native coronary artery without angina pectoris: Secondary | ICD-10-CM | POA: Diagnosis not present

## 2022-05-12 DIAGNOSIS — I480 Paroxysmal atrial fibrillation: Secondary | ICD-10-CM | POA: Diagnosis not present

## 2022-05-12 DIAGNOSIS — I493 Ventricular premature depolarization: Secondary | ICD-10-CM | POA: Diagnosis not present

## 2022-06-15 DIAGNOSIS — I10 Essential (primary) hypertension: Secondary | ICD-10-CM | POA: Diagnosis not present

## 2022-06-15 DIAGNOSIS — I4719 Other supraventricular tachycardia: Secondary | ICD-10-CM | POA: Diagnosis not present

## 2022-06-15 DIAGNOSIS — D6851 Activated protein C resistance: Secondary | ICD-10-CM | POA: Diagnosis not present

## 2022-06-15 DIAGNOSIS — R079 Chest pain, unspecified: Secondary | ICD-10-CM | POA: Diagnosis not present

## 2022-06-15 DIAGNOSIS — E785 Hyperlipidemia, unspecified: Secondary | ICD-10-CM | POA: Diagnosis not present

## 2022-06-15 DIAGNOSIS — I4821 Permanent atrial fibrillation: Secondary | ICD-10-CM | POA: Diagnosis not present

## 2022-06-15 DIAGNOSIS — I493 Ventricular premature depolarization: Secondary | ICD-10-CM | POA: Diagnosis not present

## 2022-06-15 DIAGNOSIS — D472 Monoclonal gammopathy: Secondary | ICD-10-CM | POA: Diagnosis not present

## 2022-06-15 DIAGNOSIS — I4729 Other ventricular tachycardia: Secondary | ICD-10-CM | POA: Diagnosis not present

## 2022-06-15 DIAGNOSIS — I2510 Atherosclerotic heart disease of native coronary artery without angina pectoris: Secondary | ICD-10-CM | POA: Diagnosis not present

## 2022-06-21 ENCOUNTER — Other Ambulatory Visit: Payer: Self-pay | Admitting: Internal Medicine

## 2022-06-21 DIAGNOSIS — Z79899 Other long term (current) drug therapy: Secondary | ICD-10-CM | POA: Diagnosis not present

## 2022-06-21 DIAGNOSIS — I129 Hypertensive chronic kidney disease with stage 1 through stage 4 chronic kidney disease, or unspecified chronic kidney disease: Secondary | ICD-10-CM | POA: Diagnosis not present

## 2022-06-21 DIAGNOSIS — Z0000 Encounter for general adult medical examination without abnormal findings: Secondary | ICD-10-CM | POA: Diagnosis not present

## 2022-06-21 DIAGNOSIS — E785 Hyperlipidemia, unspecified: Secondary | ICD-10-CM | POA: Diagnosis not present

## 2022-06-21 DIAGNOSIS — R911 Solitary pulmonary nodule: Secondary | ICD-10-CM

## 2022-06-21 DIAGNOSIS — N189 Chronic kidney disease, unspecified: Secondary | ICD-10-CM | POA: Diagnosis not present

## 2022-06-21 DIAGNOSIS — I4891 Unspecified atrial fibrillation: Secondary | ICD-10-CM | POA: Diagnosis not present

## 2022-07-01 ENCOUNTER — Ambulatory Visit
Admission: RE | Admit: 2022-07-01 | Discharge: 2022-07-01 | Disposition: A | Discharge status: Home / Self Care | Payer: Medicare HMO | Source: Ambulatory Visit | Attending: Internal Medicine | Admitting: Internal Medicine

## 2022-07-01 DIAGNOSIS — R918 Other nonspecific abnormal finding of lung field: Secondary | ICD-10-CM | POA: Diagnosis not present

## 2022-07-01 DIAGNOSIS — R911 Solitary pulmonary nodule: Secondary | ICD-10-CM | POA: Insufficient documentation

## 2022-07-01 DIAGNOSIS — Z0000 Encounter for general adult medical examination without abnormal findings: Secondary | ICD-10-CM | POA: Insufficient documentation

## 2022-07-06 DIAGNOSIS — N1831 Chronic kidney disease, stage 3a: Secondary | ICD-10-CM | POA: Diagnosis not present

## 2022-07-12 DIAGNOSIS — N1831 Chronic kidney disease, stage 3a: Secondary | ICD-10-CM | POA: Diagnosis not present

## 2022-07-12 DIAGNOSIS — R600 Localized edema: Secondary | ICD-10-CM | POA: Diagnosis not present

## 2022-07-12 DIAGNOSIS — I10 Essential (primary) hypertension: Secondary | ICD-10-CM | POA: Diagnosis not present

## 2022-07-18 DIAGNOSIS — J0140 Acute pansinusitis, unspecified: Secondary | ICD-10-CM | POA: Diagnosis not present

## 2022-07-18 DIAGNOSIS — J40 Bronchitis, not specified as acute or chronic: Secondary | ICD-10-CM | POA: Diagnosis not present

## 2022-07-18 DIAGNOSIS — Z03818 Encounter for observation for suspected exposure to other biological agents ruled out: Secondary | ICD-10-CM | POA: Diagnosis not present

## 2022-07-18 DIAGNOSIS — J029 Acute pharyngitis, unspecified: Secondary | ICD-10-CM | POA: Diagnosis not present

## 2022-08-12 DIAGNOSIS — D225 Melanocytic nevi of trunk: Secondary | ICD-10-CM | POA: Diagnosis not present

## 2022-08-12 DIAGNOSIS — L820 Inflamed seborrheic keratosis: Secondary | ICD-10-CM | POA: Diagnosis not present

## 2022-08-12 DIAGNOSIS — D2272 Melanocytic nevi of left lower limb, including hip: Secondary | ICD-10-CM | POA: Diagnosis not present

## 2022-08-12 DIAGNOSIS — D2271 Melanocytic nevi of right lower limb, including hip: Secondary | ICD-10-CM | POA: Diagnosis not present

## 2022-08-12 DIAGNOSIS — L821 Other seborrheic keratosis: Secondary | ICD-10-CM | POA: Diagnosis not present

## 2022-08-12 DIAGNOSIS — D2262 Melanocytic nevi of left upper limb, including shoulder: Secondary | ICD-10-CM | POA: Diagnosis not present

## 2022-08-12 DIAGNOSIS — L578 Other skin changes due to chronic exposure to nonionizing radiation: Secondary | ICD-10-CM | POA: Diagnosis not present

## 2022-08-12 DIAGNOSIS — L538 Other specified erythematous conditions: Secondary | ICD-10-CM | POA: Diagnosis not present

## 2022-08-12 DIAGNOSIS — D2261 Melanocytic nevi of right upper limb, including shoulder: Secondary | ICD-10-CM | POA: Diagnosis not present

## 2022-09-14 DIAGNOSIS — I480 Paroxysmal atrial fibrillation: Secondary | ICD-10-CM | POA: Diagnosis not present

## 2022-09-14 DIAGNOSIS — I493 Ventricular premature depolarization: Secondary | ICD-10-CM | POA: Diagnosis not present

## 2022-09-14 DIAGNOSIS — I10 Essential (primary) hypertension: Secondary | ICD-10-CM | POA: Diagnosis not present

## 2022-09-14 DIAGNOSIS — I2510 Atherosclerotic heart disease of native coronary artery without angina pectoris: Secondary | ICD-10-CM | POA: Diagnosis not present

## 2022-09-14 DIAGNOSIS — E785 Hyperlipidemia, unspecified: Secondary | ICD-10-CM | POA: Diagnosis not present

## 2022-09-22 ENCOUNTER — Other Ambulatory Visit: Payer: Self-pay | Admitting: Internal Medicine

## 2022-09-22 DIAGNOSIS — Z1231 Encounter for screening mammogram for malignant neoplasm of breast: Secondary | ICD-10-CM

## 2022-09-22 DIAGNOSIS — R7303 Prediabetes: Secondary | ICD-10-CM | POA: Diagnosis not present

## 2022-09-22 DIAGNOSIS — I480 Paroxysmal atrial fibrillation: Secondary | ICD-10-CM | POA: Diagnosis not present

## 2022-09-22 DIAGNOSIS — Z79899 Other long term (current) drug therapy: Secondary | ICD-10-CM | POA: Diagnosis not present

## 2022-09-22 DIAGNOSIS — E039 Hypothyroidism, unspecified: Secondary | ICD-10-CM | POA: Diagnosis not present

## 2022-09-22 DIAGNOSIS — M79604 Pain in right leg: Secondary | ICD-10-CM | POA: Diagnosis not present

## 2022-09-22 DIAGNOSIS — E785 Hyperlipidemia, unspecified: Secondary | ICD-10-CM | POA: Diagnosis not present

## 2022-09-22 DIAGNOSIS — N1831 Chronic kidney disease, stage 3a: Secondary | ICD-10-CM | POA: Diagnosis not present

## 2022-09-22 DIAGNOSIS — I129 Hypertensive chronic kidney disease with stage 1 through stage 4 chronic kidney disease, or unspecified chronic kidney disease: Secondary | ICD-10-CM | POA: Diagnosis not present

## 2022-09-24 ENCOUNTER — Other Ambulatory Visit: Payer: Self-pay

## 2022-09-24 DIAGNOSIS — R2241 Localized swelling, mass and lump, right lower limb: Secondary | ICD-10-CM

## 2022-10-13 DIAGNOSIS — H43813 Vitreous degeneration, bilateral: Secondary | ICD-10-CM | POA: Diagnosis not present

## 2022-10-13 DIAGNOSIS — H04123 Dry eye syndrome of bilateral lacrimal glands: Secondary | ICD-10-CM | POA: Diagnosis not present

## 2022-10-13 DIAGNOSIS — H0289 Other specified disorders of eyelid: Secondary | ICD-10-CM | POA: Diagnosis not present

## 2022-10-13 DIAGNOSIS — Z961 Presence of intraocular lens: Secondary | ICD-10-CM | POA: Diagnosis not present

## 2022-10-18 ENCOUNTER — Ambulatory Visit
Admission: RE | Admit: 2022-10-18 | Discharge: 2022-10-18 | Disposition: A | Discharge status: Home / Self Care | Payer: Medicare HMO | Source: Ambulatory Visit | Attending: Internal Medicine | Admitting: Internal Medicine

## 2022-10-18 DIAGNOSIS — M79604 Pain in right leg: Secondary | ICD-10-CM | POA: Diagnosis not present

## 2022-10-18 DIAGNOSIS — R2241 Localized swelling, mass and lump, right lower limb: Secondary | ICD-10-CM | POA: Diagnosis not present

## 2022-10-20 DIAGNOSIS — M8588 Other specified disorders of bone density and structure, other site: Secondary | ICD-10-CM | POA: Diagnosis not present

## 2022-11-01 DIAGNOSIS — N1831 Chronic kidney disease, stage 3a: Secondary | ICD-10-CM | POA: Diagnosis not present

## 2022-11-04 ENCOUNTER — Ambulatory Visit: Payer: Self-pay | Admitting: General Surgery

## 2022-11-04 DIAGNOSIS — D2120 Benign neoplasm of connective and other soft tissue of unspecified lower limb, including hip: Secondary | ICD-10-CM | POA: Diagnosis not present

## 2022-11-04 NOTE — H&P (Signed)
PATIENT PROFILE: Bonnie Crawford is a 82 y.o. female who presents to the Clinic for consultation at the request of Dr. Judithann Sheen for evaluation of soft tissue mass of the right leg.  PCP:  Marguarite Arbour, MD  HISTORY OF PRESENT ILLNESS: Bonnie Crawford reports she has been feeling a soft tissue mass in the right leg for years.  She endorses that the mass is increasing in size.  She also does significant pain and induration on the mass area.  She also endorses having difficulty putting her compressive stockings due to the size of the mass.  No pain radiation.  Aggravating factor is applying pressure.  No alleviating factors.  She had an ultrasound of the leg without any significant pathology identified.  I personally evaluated the images of the ultrasound.   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   Osteoporosis 11/06/2018   PAT (paroxysmal atrial tachycardia) 03/23/2018   Osteopenia with high risk of fracture 11/14/2017   History of gastric ulcer 11/03/2017   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   Dysphagia 09/21/2017   Bilateral leg edema 02/10/2017   Atrophic vaginitis 10/31/2015   Factor V Leiden mutation (CMS/HHS-HCC) 11/28/2014   Multinodular goiter 04/26/2013   Coronary atherosclerosis 07/30/2010   Overview    06/2010- Minimal coronary artery disease      Esophageal reflux 02/17/2010   Overview    Esophageal Reflux       Hyperlipidemia LDL goal <70 02/12/2009   Overview    Hyperlipidemia      Irritable bowel syndrome 11/08/2008   Overview    Irritable Bowel Syndrome      Diverticulosis of colon 02/12/2008   Overview    Colonic Diverticulosis  10/1 IMO update      Essential hypertension 05/10/2007   Overview    Hypertension      PVC's (premature ventricular contractions) 04/10/2007   Overview    Premature Ventricular Contractions       GENERAL REVIEW OF SYSTEMS:   General ROS: negative for - chills, fatigue, fever, weight gain or weight loss Allergy and Immunology ROS: negative for - hives  Hematological and Lymphatic ROS: negative for - bleeding problems or bruising, negative for palpable nodes Endocrine ROS: negative for - heat or cold intolerance, hair changes Respiratory ROS: negative for - cough, shortness of breath or wheezing Cardiovascular ROS: no chest pain or palpitations GI ROS: negative for nausea, vomiting, abdominal pain, diarrhea, constipation Musculoskeletal ROS: negative for - joint swelling or muscle pain Neurological ROS: negative for - confusion, syncope Dermatological ROS: negative for pruritus and rash Psychiatric: negative for anxiety, depression, difficulty sleeping and memory loss  MEDICATIONS: Current Outpatient Medications  Medication Sig Dispense Refill   acetaminophen (TYLENOL) 500 MG tablet Take 1 tablet (500 mg total) by mouth once daily as needed     AMIOdarone (PACERONE) 200 MG tablet Take 0.5 tablets (100 mg total) by mouth once daily  90 tablet 3   apixaban (ELIQUIS) 5 mg tablet Take 1 tablet (5 mg total) by mouth every 12 (twelve) hours 180 tablet 3   atenoloL (TENORMIN) 25 MG tablet TAKE 1 TABLET ONE TIME DAILY 90 tablet 3   atorvastatin (LIPITOR) 20 MG tablet TAKE 1 TABLET EVERY DAY 90 tablet 3   calcium citrate-vitamin D3 (CITRACAL+D) 315 mg- 250 unit tablet Take 1 tablet by mouth 2 (two) times daily     levothyroxine (SYNTHROID) 50 MCG tablet Take  1 tablet (50 mcg total) by mouth once daily Take on an empty stomach with a glass of water at least 30-60 minutes before breakfast. 90 tablet 3   neomycin-polymyxin-hydrocortisone (CORTISPORIN) otic suspension Place 2 drops into both ears once daily as needed     psyllium, with dextrose, (METAMUCIL) powder Take by mouth as needed     No current facility-administered medications for this visit.    ALLERGIES: Metoprolol succinate, Metoprolol tartrate, Dexatone [dexamethasone acetate], and Isosorbide  PAST MEDICAL HISTORY: Past Medical History:  Diagnosis Date   Anxiety    Arthritis    Clotting disorder (CMS/HHS-HCC)    Colon polyp    Diverticulitis    GERD (gastroesophageal reflux disease)    Heart murmur    Hemorrhoids    Herpes zoster without complication    Hyperlipidemia    Hypertension    IBS (irritable bowel syndrome)    Lipoma 09/17/2015   Osteoporosis    PSVT (paroxysmal supraventricular tachycardia)    Rheumatic heart disease     PAST SURGICAL HISTORY: Past Surgical History:  Procedure Laterality Date   COLONOSCOPY  09/2011   COLONOSCOPY  09/2016   APPENDECTOMY     CARDIOVERSION EXTERNAL     CATARACT EXTRACTION Bilateral    one in march one in april 2022     FAMILY HISTORY: Family History  Problem Relation Name Age of Onset   COPD Mother     Emphysema Mother     High blood pressure (Hypertension) Father     Heart disease Father     Aneurysm Father     Stroke Father     Lung disease Father     Breast cancer Sister     Kidney cancer Brother     Stomach cancer Brother     Lung cancer Brother     Diabetes type II Sister     High blood pressure (Hypertension) Sister     Thyroid disease Sister     Stroke Sister       SOCIAL HISTORY: Social History   Socioeconomic History   Marital status: Widowed   Number of children: 0   Highest education level: Associate degree: occupational, Scientist, product/process development, or vocational program  Occupational History   Occupation:  Retired  Tobacco Use   Smoking status: Former    Current packs/day: 0.00    Types: Cigarettes    Quit date: 12/08/1992    Years since quitting: 29.9   Smokeless tobacco: Never  Vaping Use   Vaping status: Never Used  Substance and Sexual Activity   Alcohol use: Yes   Drug use: Never   Sexual activity: Not Currently    Partners: Male    PHYSICAL EXAM: Vitals:   11/04/22 1337  BP: 128/70  Pulse: 59   Body mass index is 29.78 kg/m. Weight: 73.8 kg (162 lb 12.8 oz)   GENERAL: Alert, active, oriented x3  HEENT: Pupils equal reactive to light. Extraocular movements are intact. Sclera clear. Palpebral conjunctiva normal red  color.Pharynx clear.  NECK: Supple with no palpable mass and no adenopathy.  LUNGS: Sound clear with no rales rhonchi or wheezes.  HEART: Regular rhythm S1 and S2 without murmur.  ABDOMEN: Soft and depressible, nontender with no palpable mass, no hepatomegaly.   EXTREMITIES: Well-developed well-nourished symmetrical with no pitting edema.  There is a large 8 cm diam 5 cm deep mass on the right upper leg.  NEUROLOGICAL: Awake alert oriented, facial expression symmetrical, moving all extremities.  REVIEW OF DATA: I have reviewed the following data today: Office Visit on 09/22/2022  Component Date Value   WBC (White Blood Cell Co* 09/22/2022 8.2    RBC (Red Blood Cell Coun* 09/22/2022 4.68    Hemoglobin 09/22/2022 14.2    Hematocrit 09/22/2022 44.1    MCV (Mean Corpuscular Vo* 09/22/2022 94.2    MCH (Mean Corpuscular He* 09/22/2022 30.3    MCHC (Mean Corpuscular H* 09/22/2022 32.2    Platelet Count 09/22/2022 286    RDW-CV (Red Cell Distrib* 09/22/2022 13.8    MPV (Mean Platelet Volum* 09/22/2022 10.9    Neutrophils 09/22/2022 5.17    Lymphocytes 09/22/2022 2.27    Monocytes 09/22/2022 0.38    Eosinophils 09/22/2022 0.21    Basophils 09/22/2022 0.09    Neutrophil % 09/22/2022 63.1    Lymphocyte % 09/22/2022 27.7    Monocyte % 09/22/2022 4.6     Eosinophil % 09/22/2022 2.6    Basophil% 09/22/2022 1.1    Immature Granulocyte % 09/22/2022 0.9 (H)    Immature Granulocyte Cou* 09/22/2022 0.07 (H)    Glucose 09/22/2022 85    Sodium 09/22/2022 140    Potassium 09/22/2022 5.1    Chloride 09/22/2022 106    Carbon Dioxide (CO2) 09/22/2022 25.8    Urea Nitrogen (BUN) 09/22/2022 17    Creatinine 09/22/2022 1.2 (H)    Glomerular Filtration Ra* 09/22/2022 45 (L)    Calcium 09/22/2022 9.8    AST  09/22/2022 18    ALT  09/22/2022 15    Alk Phos (alkaline Phosp* 09/22/2022 61    Albumin 09/22/2022 4.0    Bilirubin, Total 09/22/2022 0.5    Protein, Total 09/22/2022 6.1    A/G Ratio 09/22/2022 1.9    Thyroid Stimulating Horm* 09/22/2022 3.043    Color 09/22/2022 Light Yellow    Clarity 09/22/2022 Clear    Specific Gravity 09/22/2022 1.015    pH, Urine 09/22/2022 5.5    Protein, Urinalysis 09/22/2022 Negative    Glucose, Urinalysis 09/22/2022 Negative    Ketones, Urinalysis 09/22/2022 Negative    Blood, Urinalysis 09/22/2022 Trace (!)    Nitrite, Urinalysis 09/22/2022 Negative    Leukocyte Esterase, Urin* 09/22/2022 Negative    Bilirubin, Urinalysis 09/22/2022 Negative    Urobilinogen, Urinalysis 09/22/2022 0.2    WBC, UA 09/22/2022 <1    Red Blood Cells, Urinaly* 09/22/2022 2    Bacteria, Urinalysis 09/22/2022 0-5    Squamous Epithelial Cell* 09/22/2022 0    Hemoglobin A1C 09/22/2022 5.8 (H)    Average Blood Glucose (C* 09/22/2022 120      ASSESSMENT: Bonnie Crawford is a 82 y.o. female presenting for consultation for soft tissue mass of the right leg. The patient has a mass on the right upper medial leg that is causing some pain and discomfort on pressure. Patient oriented about the diagnosis of possible benign large soft tissue mass, not a malignant lesion but if it is causing symptoms, excision can be considered. Patient oriented about the procedure, benefits and risk.   Due  to the size of the mass and patient on blood thinner I will  recommend to do these in the operation room.  Patient agreed.  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   Patient verbalized understanding, all questions were answered, and were agreeable with the plan outlined above.

## 2022-11-04 NOTE — H&P (View-Only) (Signed)
PATIENT PROFILE: Bonnie Crawford is a 81 y.o. female who presents to the Clinic for consultation at the request of Dr. Sparks for evaluation of soft tissue mass of the right leg.  PCP:  Sparks, Jeffrey D, MD  HISTORY OF PRESENT ILLNESS: Bonnie Crawford reports she has been feeling a soft tissue mass in the right leg for years.  She endorses that the mass is increasing in size.  She also does significant pain and induration on the mass area.  She also endorses having difficulty putting her compressive stockings due to the size of the mass.  No pain radiation.  Aggravating factor is applying pressure.  No alleviating factors.  She had an ultrasound of the leg without any significant pathology identified.  I personally evaluated the images of the ultrasound.   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   Osteoporosis 11/06/2018   PAT (paroxysmal atrial tachycardia) 03/23/2018   Osteopenia with high risk of fracture 11/14/2017   History of gastric ulcer 11/03/2017   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   Dysphagia 09/21/2017   Bilateral leg edema 02/10/2017   Atrophic vaginitis 10/31/2015   Factor V Leiden mutation (CMS/HHS-HCC) 11/28/2014   Multinodular goiter 04/26/2013   Coronary atherosclerosis 07/30/2010   Overview    06/2010- Minimal coronary artery disease      Esophageal reflux 02/17/2010   Overview    Esophageal Reflux       Hyperlipidemia LDL goal <70 02/12/2009   Overview    Hyperlipidemia      Irritable bowel syndrome 11/08/2008   Overview    Irritable Bowel Syndrome      Diverticulosis of colon 02/12/2008   Overview    Colonic Diverticulosis  10/1 IMO update      Essential hypertension 05/10/2007   Overview    Hypertension      PVC's (premature ventricular contractions) 04/10/2007   Overview    Premature Ventricular Contractions       GENERAL REVIEW OF SYSTEMS:   General ROS: negative for - chills, fatigue, fever, weight gain or weight loss Allergy and Immunology ROS: negative for - hives  Hematological and Lymphatic ROS: negative for - bleeding problems or bruising, negative for palpable nodes Endocrine ROS: negative for - heat or cold intolerance, hair changes Respiratory ROS: negative for - cough, shortness of breath or wheezing Cardiovascular ROS: no chest pain or palpitations GI ROS: negative for nausea, vomiting, abdominal pain, diarrhea, constipation Musculoskeletal ROS: negative for - joint swelling or muscle pain Neurological ROS: negative for - confusion, syncope Dermatological ROS: negative for pruritus and rash Psychiatric: negative for anxiety, depression, difficulty sleeping and memory loss  MEDICATIONS: Current Outpatient Medications  Medication Sig Dispense Refill   acetaminophen (TYLENOL) 500 MG tablet Take 1 tablet (500 mg total) by mouth once daily as needed     AMIOdarone (PACERONE) 200 MG tablet Take 0.5 tablets (100 mg total) by mouth once daily   90 tablet 3   apixaban (ELIQUIS) 5 mg tablet Take 1 tablet (5 mg total) by mouth every 12 (twelve) hours 180 tablet 3   atenoloL (TENORMIN) 25 MG tablet TAKE 1 TABLET ONE TIME DAILY 90 tablet 3   atorvastatin (LIPITOR) 20 MG tablet TAKE 1 TABLET EVERY DAY 90 tablet 3   calcium citrate-vitamin D3 (CITRACAL+D) 315 mg- 250 unit tablet Take 1 tablet by mouth 2 (two) times daily     levothyroxine (SYNTHROID) 50 MCG tablet Take  1 tablet (50 mcg total) by mouth once daily Take on an empty stomach with a glass of water at least 30-60 minutes before breakfast. 90 tablet 3   neomycin-polymyxin-hydrocortisone (CORTISPORIN) otic suspension Place 2 drops into both ears once daily as needed     psyllium, with dextrose, (METAMUCIL) powder Take by mouth as needed     No current facility-administered medications for this visit.    ALLERGIES: Metoprolol succinate, Metoprolol tartrate, Dexatone [dexamethasone acetate], and Isosorbide  PAST MEDICAL HISTORY: Past Medical History:  Diagnosis Date   Anxiety    Arthritis    Clotting disorder (CMS/HHS-HCC)    Colon polyp    Diverticulitis    GERD (gastroesophageal reflux disease)    Heart murmur    Hemorrhoids    Herpes zoster without complication    Hyperlipidemia    Hypertension    IBS (irritable bowel syndrome)    Lipoma 09/17/2015   Osteoporosis    PSVT (paroxysmal supraventricular tachycardia)    Rheumatic heart disease     PAST SURGICAL HISTORY: Past Surgical History:  Procedure Laterality Date   COLONOSCOPY  09/2011   COLONOSCOPY  09/2016   APPENDECTOMY     CARDIOVERSION EXTERNAL     CATARACT EXTRACTION Bilateral    one in march one in april 2022     FAMILY HISTORY: Family History  Problem Relation Name Age of Onset   COPD Mother     Emphysema Mother     High blood pressure (Hypertension) Father     Heart disease Father     Aneurysm Father     Stroke Father     Lung disease Father     Breast cancer Sister     Kidney cancer Brother     Stomach cancer Brother     Lung cancer Brother     Diabetes type II Sister     High blood pressure (Hypertension) Sister     Thyroid disease Sister     Stroke Sister       SOCIAL HISTORY: Social History   Socioeconomic History   Marital status: Widowed   Number of children: 0   Highest education level: Associate degree: occupational, technical, or vocational program  Occupational History   Occupation:  Retired  Tobacco Use   Smoking status: Former    Current packs/day: 0.00    Types: Cigarettes    Quit date: 12/08/1992    Years since quitting: 29.9   Smokeless tobacco: Never  Vaping Use   Vaping status: Never Used  Substance and Sexual Activity   Alcohol use: Yes   Drug use: Never   Sexual activity: Not Currently    Partners: Male    PHYSICAL EXAM: Vitals:   11/04/22 1337  BP: 128/70  Pulse: 59   Body mass index is 29.78 kg/m. Weight: 73.8 kg (162 lb 12.8 oz)   GENERAL: Alert, active, oriented x3  HEENT: Pupils equal reactive to light. Extraocular movements are intact. Sclera clear. Palpebral conjunctiva normal red   color.Pharynx clear.  NECK: Supple with no palpable mass and no adenopathy.  LUNGS: Sound clear with no rales rhonchi or wheezes.  HEART: Regular rhythm S1 and S2 without murmur.  ABDOMEN: Soft and depressible, nontender with no palpable mass, no hepatomegaly.   EXTREMITIES: Well-developed well-nourished symmetrical with no pitting edema.  There is a large 8 cm diam 5 cm deep mass on the right upper leg.  NEUROLOGICAL: Awake alert oriented, facial expression symmetrical, moving all extremities.  REVIEW OF DATA: I have reviewed the following data today: Office Visit on 09/22/2022  Component Date Value   WBC (White Blood Cell Co* 09/22/2022 8.2    RBC (Red Blood Cell Coun* 09/22/2022 4.68    Hemoglobin 09/22/2022 14.2    Hematocrit 09/22/2022 44.1    MCV (Mean Corpuscular Vo* 09/22/2022 94.2    MCH (Mean Corpuscular He* 09/22/2022 30.3    MCHC (Mean Corpuscular H* 09/22/2022 32.2    Platelet Count 09/22/2022 286    RDW-CV (Red Cell Distrib* 09/22/2022 13.8    MPV (Mean Platelet Volum* 09/22/2022 10.9    Neutrophils 09/22/2022 5.17    Lymphocytes 09/22/2022 2.27    Monocytes 09/22/2022 0.38    Eosinophils 09/22/2022 0.21    Basophils 09/22/2022 0.09    Neutrophil % 09/22/2022 63.1    Lymphocyte % 09/22/2022 27.7    Monocyte % 09/22/2022 4.6     Eosinophil % 09/22/2022 2.6    Basophil% 09/22/2022 1.1    Immature Granulocyte % 09/22/2022 0.9 (H)    Immature Granulocyte Cou* 09/22/2022 0.07 (H)    Glucose 09/22/2022 85    Sodium 09/22/2022 140    Potassium 09/22/2022 5.1    Chloride 09/22/2022 106    Carbon Dioxide (CO2) 09/22/2022 25.8    Urea Nitrogen (BUN) 09/22/2022 17    Creatinine 09/22/2022 1.2 (H)    Glomerular Filtration Ra* 09/22/2022 45 (L)    Calcium 09/22/2022 9.8    AST  09/22/2022 18    ALT  09/22/2022 15    Alk Phos (alkaline Phosp* 09/22/2022 61    Albumin 09/22/2022 4.0    Bilirubin, Total 09/22/2022 0.5    Protein, Total 09/22/2022 6.1    A/G Ratio 09/22/2022 1.9    Thyroid Stimulating Horm* 09/22/2022 3.043    Color 09/22/2022 Light Yellow    Clarity 09/22/2022 Clear    Specific Gravity 09/22/2022 1.015    pH, Urine 09/22/2022 5.5    Protein, Urinalysis 09/22/2022 Negative    Glucose, Urinalysis 09/22/2022 Negative    Ketones, Urinalysis 09/22/2022 Negative    Blood, Urinalysis 09/22/2022 Trace (!)    Nitrite, Urinalysis 09/22/2022 Negative    Leukocyte Esterase, Urin* 09/22/2022 Negative    Bilirubin, Urinalysis 09/22/2022 Negative    Urobilinogen, Urinalysis 09/22/2022 0.2    WBC, UA 09/22/2022 <1    Red Blood Cells, Urinaly* 09/22/2022 2    Bacteria, Urinalysis 09/22/2022 0-5    Squamous Epithelial Cell* 09/22/2022 0    Hemoglobin A1C 09/22/2022 5.8 (H)    Average Blood Glucose (C* 09/22/2022 120      ASSESSMENT: Bonnie Crawford is a 81 y.o. female presenting for consultation for soft tissue mass of the right leg. The patient has a mass on the right upper medial leg that is causing some pain and discomfort on pressure. Patient oriented about the diagnosis of possible benign large soft tissue mass, not a malignant lesion but if it is causing symptoms, excision can be considered. Patient oriented about the procedure, benefits and risk.   Due   to the size of the mass and patient on blood thinner I will  recommend to do these in the operation room.  Patient agreed.  Benign neoplasm of soft tissue of lower leg [D21.20]  PLAN: Excision of right leg soft tissue mass (27634) Hold Eliquis 2 days before surgery Medical clearance from Internal Medicine Contact us if you have any concern.   Patient verbalized understanding, all questions were answered, and were agreeable with the plan outlined above.   

## 2022-11-08 DIAGNOSIS — R600 Localized edema: Secondary | ICD-10-CM | POA: Diagnosis not present

## 2022-11-08 DIAGNOSIS — I10 Essential (primary) hypertension: Secondary | ICD-10-CM | POA: Diagnosis not present

## 2022-11-08 DIAGNOSIS — N1831 Chronic kidney disease, stage 3a: Secondary | ICD-10-CM | POA: Diagnosis not present

## 2022-11-15 ENCOUNTER — Encounter
Admission: RE | Admit: 2022-11-15 | Discharge: 2022-11-15 | Disposition: A | Discharge status: Home / Self Care | Payer: Medicare HMO | Source: Ambulatory Visit | Attending: General Surgery | Admitting: General Surgery

## 2022-11-15 ENCOUNTER — Other Ambulatory Visit: Payer: Self-pay

## 2022-11-15 HISTORY — DX: Spondylosis without myelopathy or radiculopathy, cervical region: M47.812

## 2022-11-15 HISTORY — DX: Prediabetes: R73.03

## 2022-11-15 HISTORY — DX: Atherosclerotic heart disease of native coronary artery without angina pectoris: I25.10

## 2022-11-15 HISTORY — DX: Hypothyroidism, unspecified: E03.9

## 2022-11-15 NOTE — Patient Instructions (Addendum)
Your procedure is scheduled on: Monday ,July 8 Report to the Registration Desk on the 1st floor of the [facility] To find out your arrival time, please call [phone] between 1PM - 3PM on: Friday, July 5 If your arrival time is 6:00 am, do not arrive before that time as the [facility] entrance doors do not open until 6:00 am. [facility] parking is available.   REMEMBER: Instructions that are not followed completely may result in serious medical risk, up to and including death; or upon the discretion of your surgeon and anesthesiologist your surgery may need to be rescheduled.  Do not eat food or drink any liquids after midnight the night before surgery.  No gum chewing or hard candies.  One week prior to surgery: Stop Anti-inflammatories (NSAIDS) such as Advil, Aleve, Ibuprofen, Motrin, Naproxen, Naprosyn and Aspirin based products such as Excedrin, Goody's Powder, BC Powder. Stop ANY OVER THE COUNTER supplements until after surgery. You may however, continue to take Tylenol if needed for pain up until the day of surgery.  Continue taking all prescribed medications with the exception of the following: apixaban (ELIQUIS) hold for 2 days prior to surgery ( last dose Friday July 5)   TAKE ONLY THESE MEDICATIONS THE MORNING OF SURGERY WITH A SIP OF WATER:  amiodarone (PACERONE)  atenolol (TENORMIN)  atorvastatin (LIPITOR)  cetirizine (ZYRTEC)  omeprazole (PRILOSEC) Antacid (take one the night before and one on the morning of surgery - helps to prevent nausea after surgery.)  No Alcohol for 24 hours before or after surgery.  No Smoking including e-cigarettes for 24 hours before surgery.  No chewable tobacco products for at least 6 hours before surgery.  No nicotine patches on the day of surgery.  Do not use any "recreational" drugs for at least a week (preferably 2 weeks) before your surgery.  Please be advised that the combination of cocaine and anesthesia may have negative  outcomes, up to and including death. If you test positive for cocaine, your surgery will be cancelled.  On the morning of surgery brush your teeth with toothpaste and water, you may rinse your mouth with mouthwash if you wish. Do not swallow any toothpaste or mouthwash.  Use CHG Soap as directed on instruction sheet.  Do not wear jewelry, make-up, hairpins, clips or nail polish.  Do not wear lotions, powders, or perfumes.   Do not shave body hair from the neck down 48 hours before surgery.  Contact lenses, hearing aids and dentures may not be worn into surgery.  Do not bring valuables to the hospital. [facility] is not responsible for any missing/lost belongings or valuables.    Notify your doctor if there is any change in your medical condition (cold, fever, infection).  Wear comfortable clothing (specific to your surgery type) to the hospital.  After surgery, you can help prevent lung complications by doing breathing exercises.  Take deep breaths and cough every 1-2 hours. Your doctor may order a device called an Incentive Spirometer to help you take deep breaths.  If you are being admitted to the hospital overnight, leave your suitcase in the car. After surgery it may be brought to your room.  In case of increased patient census, it may be necessary for you, the patient, to continue your postoperative care in the Same Day Surgery department.  If you are being discharged the day of surgery, you will not be allowed to drive home. You will need a responsible individual to drive you  home and stay with you for 24 hours after surgery.   If you are taking public transportation, you will need to have a responsible individual with you.  Please call the Pre-admissions Testing Dept. at [phone] if you have any questions about these instructions.  Surgery Visitation Policy:  Patients having surgery or a procedure may have two visitors.  Children under the age of 89 must have an  adult with them who is not the patient.  Inpatient Visitation:    Visiting hours are 7 a.m. to 8 p.m. Up to four visitors are allowed at one time in a patient room. The visitors may rotate out with other people during the day.  One visitor age 68 or older may stay with the patient overnight and must be in the room by 8 p.m.     Preparing for Surgery with CHLORHEXIDINE GLUCONATE (CHG) Soap  Chlorhexidine Gluconate (CHG) Soap  o An antiseptic cleaner that kills germs and bonds with the skin to continue killing germs even after washing  o Used for showering the night before surgery and morning of surgery  Before surgery, you can play an important role by reducing the number of germs on your skin.  CHG (Chlorhexidine gluconate) soap is an antiseptic cleanser which kills germs and bonds with the skin to continue killing germs even after washing.  Please do not use if you have an allergy to CHG or antibacterial soaps. If your skin becomes reddened/irritated stop using the CHG.  1. Shower the NIGHT BEFORE SURGERY and the MORNING OF SURGERY with CHG soap.  2. If you choose to wash your hair, wash your hair first as usual with your normal shampoo.  3. After shampooing, rinse your hair and body thoroughly to remove the shampoo.  4. Use CHG as you would any other liquid soap. You can apply CHG directly to the skin and wash gently with a scrungie or a clean washcloth.  5. Apply the CHG soap to your body only from the neck down. Do not use on open wounds or open sores. Avoid contact with your eyes, ears, mouth, and genitals (private parts). Wash face and genitals (private parts) with your normal soap.  6. Wash thoroughly, paying special attention to the area where your surgery will be performed.  7. Thoroughly rinse your body with warm water.  8. Do not shower/wash with your normal soap after using and rinsing off the CHG soap.  9. Pat yourself dry with a clean towel.  10. Wear clean  pajamas to bed the night before surgery.  12. Place clean sheets on your bed the night of your first shower and do not sleep with pets.  13. Shower again with the CHG soap on the day of surgery prior to arriving at the hospital.  14. Do not apply any deodorants/lotions/powders.  15. Please wear clean clothes to the hospital.

## 2022-11-16 ENCOUNTER — Encounter: Payer: Self-pay | Admitting: Urgent Care

## 2022-11-16 ENCOUNTER — Encounter
Admission: RE | Admit: 2022-11-16 | Discharge: 2022-11-16 | Disposition: A | Discharge status: Home / Self Care | Payer: Medicare HMO | Source: Ambulatory Visit | Attending: General Surgery | Admitting: General Surgery

## 2022-11-16 DIAGNOSIS — I25118 Atherosclerotic heart disease of native coronary artery with other forms of angina pectoris: Secondary | ICD-10-CM | POA: Insufficient documentation

## 2022-11-16 DIAGNOSIS — Z01810 Encounter for preprocedural cardiovascular examination: Secondary | ICD-10-CM | POA: Insufficient documentation

## 2022-11-16 DIAGNOSIS — I10 Essential (primary) hypertension: Secondary | ICD-10-CM | POA: Insufficient documentation

## 2022-11-16 DIAGNOSIS — R001 Bradycardia, unspecified: Secondary | ICD-10-CM | POA: Diagnosis not present

## 2022-11-16 DIAGNOSIS — R079 Chest pain, unspecified: Secondary | ICD-10-CM | POA: Diagnosis not present

## 2022-11-16 DIAGNOSIS — E785 Hyperlipidemia, unspecified: Secondary | ICD-10-CM | POA: Diagnosis not present

## 2022-11-16 NOTE — Progress Notes (Signed)
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 Clinical nursing staff has updated patient's PMH/PSHx, current medication list, and drug allergies/intolerances to ensure comprehensive history available to assist in medical decision making as it pertains to the aforementioned surgical procedure and anticipated anesthetic course. Extensive review of available clinical information personally performed. [facility] and PSHx updated with any diagnoses/procedures that  may have been inadvertently omitted during her intake with the pre-admission testing department's nursing staff.  Clinical Discussion:  Bonnie Crawford is a 82 y.o. female who is submitted for pre-surgical anesthesia review and clearance prior to her undergoing the above procedure. Patient is a Former Smoker (quit 05/1995). Pertinent PMH includes: CAD, atrial fibrillation, PAD/PSVT, diastolic dysfunction, PVCs, sinus bradycardia, cardiac murmur, aortic atherosclerosis, HTN, HLD, prediabetes, hypothyroidism, multinodular goiter, CKD-III, GERD (on daily PPI), MGUS, factor V Leiden, lymphedema, OA, cervical spondylosis, anxiety.  Patient is followed by cardiology Darrold Junker, MD). She was last seen in the cardiology clinic on 09/14/2022; notes reviewed. At the time of her clinic visit, patient reported episodes of vertiginous symptoms and feeling presyncopal.  She also complained of  chronic lower extremity edema that was stable and at baseline.  Patient unable to tolerate compression stockings.  She does have a history of lymphedema.  Patient with reports of occasional episodes of postprandial chest tightness.  She denied any episodes of actual chest pain, shortness breath, PND, orthopnea, palpitations, fatigue, or increased weakness. Patient with a past medical history significant for cardiovascular diagnoses. Documented physical exam was grossly benign, providing no evidence of acute exacerbation and/or decompensation of the patient's known cardiovascular conditions.  Most recent TTE was performed on 12/23/2021 revealing a normal left ventricular systolic function with an EF of >55%.  There were no regional wall motion abnormalities. Left ventricular diastolic Doppler parameters consistent with abnormal relaxation (G1DD).  Right ventricular size and function normal.  There was trivial to mild pan valvular regurgitation.  All transvalvular gradients were noted to be normal providing no evidence suggestive of valvular stenosis.  Aorta normal in size with no evidence of aneurysmal dilatation.  Coronary CTA was performed on 04/12/2022 revealing an elevated coronary calcium score of 438.  This placed patient in the 76th percentile for age, sex, and race matched controls.  Patient with normal coronary origin with RIGHT-sided dominance.  Study suggestive of mild (25-49%) stenosis within the proximal LAD.  There were no significant extracardiac findings noted.  Long-term cardiac event monitor study performed between 05/12/2022 -05/22/2022 revealed a predominant underlying sinus bradycardia with a mean rate of 49 bpm. There are were no prolonged pauses or significant arrhythmias noted.  Study was performed following a DCCV procedure that was performed on 05/04/2022.  Patient with an atrial fibrillation diagnosis; CHA2DS2-VASc Score = 5 (age x 2, sex, HTN, vascular disease history). Patient  underwent DCCV procedure on 05/04/2022.  She received a single 47 J cardioversion converting her to NSR.  Cardiac rate and rhythm currently being maintained  on oral amiodarone + atenolol. She is chronically anticoagulated using apixaban; reported to be compliant with therapy with no evidence or reports of GI bleeding.  Patient has a history of a coagulopathy (factor V Leiden) that was being managed by her PCP. Patient does have a history of a blood pressure well controlled at 124/80 mmHg on currently prescribed beta-blocker (atenolol) and diuretic (spironolactone) therapies. She is on atorvastatin for her HLD diagnosis and further ASCVD prevention.  Patient has a prediabetes diagnosis; last HgbA1c was 6.2% when checked on 06/21/2022.  Of note, since patient was last seen by cardiology, her hemoglobin A1c level has been rechecked with improvement to 5.8% on 09/22/2022.  Patient does not have an OSAH diagnosis.  Functional capacity somewhat limited by patient's age and multiple medical comorbidities.  With that said, patient is still able to complete all of her ADLs/IADLs independently without cardiovascular limitation.  Per the [facility], patient is able to achieve at least 4 METS of physical activity without experiencing any significant degrees of angina/anginal equivalent symptoms.  No changes were made to her medication regimen.  Patient follow-up with outpatient cardiology in 3 months or sooner if needed.  Bonnie Crawford is scheduled for an elective BENIGN RIGHT LOWER EXTREMITY SOFT TISSUE MASS EXCISION on 11/22/2022 with Dr. Carolan Shiver, MD.  Given patient's past medical history significant for cardiovascular and hematological diagnoses, presurgical clearances were sought from patient's internal medicine and cardiology care team.  Clearances were obtained as follows:  Per internal medicine Judithann Sheen, MD), "patient is optimized for surgery from a medical standpoint.  She may proceed with an overall  MODERATE risk of complications".  Per cardiology Mellissa Kohut, PA-C), "patient is optimized for surgery from a cardiovascular perspective.  She may proceed with an overall MODERATE risk of significant cardiovascular complications".  Again, this patient is on daily oral anticoagulation therapy using a DOAC medication.  She has been instructed on recommendations for holding her apixaban for 2 days prior to her procedure with plans to restart as soon as postoperative bleeding risk felt to be minimized by her attending surgeon. The patient has been instructed that her last dose of her apixaban should be on 11/19/2022.  Due to history of factor V Leiden, patient has been prescribed and enoxaparin bridge surrounding this procedure.  Cardiology has provided patient with both prescription and instructions for bridging.  Patient denies previous perioperative complications with anesthesia in the past. In review of the available records, it is noted that patient underwent a general anesthetic course here at [facility] ([facility]) in 04/2022 without documented complications.      11/15/2022    3:55 PM 05/04/2022    8:30 AM 05/04/2022    8:15 AM  Vitals with BMI  Height 5' 2.5"    Weight 165 lbs    BMI 29.68    Systolic  107 116  Diastolic  78 59  Pulse  46 46    Providers/Specialists:   NOTE: Primary physician provider listed below. Patient may have been seen by APP or partner within same practice.   PROVIDER ROLE / SPECIALTY LAST Beverely Pace, MD General Surgery (Surgeon) 11/04/2022  Marguarite Arbour, MD Primary Care Provider 09/22/2022  Marcina Millard, MD Cardiology 09/14/2022  Mady Haagensen, MD Nephrology 11/08/2022   Allergies:  Metoprolol succinate  [metoprolol tartrate], Other, and Dexamethasone  Current Home Medications:   No current facility-administered medications for this encounter.    acetaminophen (TYLENOL) 500 MG tablet  amiodarone (PACERONE) 200 MG tablet   apixaban (ELIQUIS) 5 MG TABS tablet   atenolol (TENORMIN) 50 MG tablet   atorvastatin (LIPITOR) 20 MG tablet   calcium citrate (CALCITRATE - DOSED IN MG ELEMENTAL CALCIUM) 950 (200 Ca) MG tablet   Calcium Citrate-Vitamin D 200-3.125 MG-MCG TABS   carboxymethylcellulose (REFRESH PLUS) 0.5 % SOLN   cetirizine (ZYRTEC) 5 MG tablet   dextromethorphan-guaiFENesin (MUCINEX DM) 30-600 MG 12hr tablet   fluticasone (FLONASE) 50 MCG/ACT nasal spray   levothyroxine (SYNTHROID) 50 MCG tablet   Multiple Vitamin (MULTIVITAMIN) tablet   omeprazole (PRILOSEC) 20 MG capsule   psyllium (METAMUCIL) 58.6 % powder   Simethicone (GAS-X PO)   spironolactone (ALDACTONE) 25 MG tablet   tamsulosin (FLOMAX) 0.4 MG CAPS capsule   History:   Past Medical History:  Diagnosis Date   Anxiety    Aortic atherosclerosis (HCC)    Arthritis    Atrial fibrillation (HCC) 02/2022   a.) Dx'd 02/2022; b.) CHA2DS2VASc = 5 (age x2, sex, HTN, vascular disease history);  c.) s/p DCCV (75J x1) 05/04/2022; d.) rate/rhythm maintained on oral amiodarone + atenolol; chronically anticoagulated with apixaban   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    CAD (coronary artery disease) 04/12/2022   a.) cCTA 04/12/2022: Ca2+ 438 (76th percentile for age/sex/race matched control)   Cardiac murmur    Cervical spondylosis    Childhood asthma    CKD (chronic kidney disease), stage III (HCC)    Colon polyp    Diastolic dysfunction 12/23/2021   a.) TTE 12/23/2021: EF >55%, triv AR, mild MR/TR/PR, G1DD   Diverticulosis    Factor V Leiden (HCC)    GERD (gastroesophageal reflux disease)    Hemorrhoids    Herpes zoster    History of bilateral cataract extraction 2022   History of cardiac catheterization 07/15/2010   a.) LHC 07/15/2010: normal coronaries and hemodynamics; b.) LHC 04/05/2018: normal coronaries   History of kidney stones    Hyperlipidemia    Hypertension     Hypothyroidism    Inflammatory bowel disease    Lipoma 09/17/2015   Long term current use of amiodarone    Long term current use of anticoagulant    a.) apixaban   Lymphedema    MGUS (monoclonal gammopathy of unknown significance)    Multinodular goiter    Osteoporosis    PAT (paroxysmal atrial tachycardia)    Prediabetes    PSVT (paroxysmal supraventricular tachycardia)    PVC's (premature ventricular contractions)    Sinus bradycardia    Past Surgical History:  Procedure Laterality Date   ABDOMINAL HYSTERECTOMY     APPENDECTOMY     BLEPHAROPLASTY Bilateral    BREAST CYST ASPIRATION Left    neg   CARDIOVERSION N/A 05/04/2022   Procedure: CARDIOVERSION;  Surgeon: Marcina Millard, MD;  Location: [facility] ORS;  Service: Cardiovascular;  Laterality: N/A;   CATARACT EXTRACTION W/PHACO Left 07/28/2020   Procedure: CATARACT EXTRACTION PHACO AND INTRAOCULAR LENS PLACEMENT (IOC) LEFT PANOPTIX TORIC 4.25 00:30.9;  Surgeon: Nevada Crane, MD;  [facility];  Service: Ophthalmology;  Laterality: Left;   CATARACT EXTRACTION W/PHACO Right 08/18/2020   Procedure: CATARACT EXTRACTION PHACO AND INTRAOCULAR LENS PLACEMENT (IOC) RIGHT PANOPTIX TORIC 2.31 00:20.0;  Surgeon: Nevada Crane, MD;  Location: [facility];  Service: Ophthalmology;  Laterality: Right;   COLONOSCOPY     KNEE SURGERY Left    2 meniscus tears   LEFT HEART CATH AND CORONARY ANGIOGRAPHY Left 07/15/2010  Procedure: LEFT HEART CATH AND CORONARY ANGIOGRAPHY; Location: [facility]; Surgeon: Vilinda Boehringer, MD   LEFT HEART CATH AND CORONARY ANGIOGRAPHY Left 04/05/2018   Procedure: LEFT HEART CATH AND CORONARY ANGIOGRAPHY; Location: [facility]; Surgeon: Rodman Key, MD   NECK SURGERY     cosmetic   Family History  Problem Relation Age of Onset   Lung cancer Sister    Breast cancer Sister 48   Factor V Leiden deficiency Brother    Lung  cancer Brother    Kidney cancer Brother    Factor V Leiden deficiency Brother    Factor V Leiden deficiency Brother    Stomach cancer Brother    Vasculitis Sister    Social History   Tobacco Use   Smoking status: Former    Types: Cigarettes    Quit date: 1997    Years since quitting: 27.5   Smokeless tobacco: Never  Vaping Use   Vaping Use: Never used  Substance Use Topics   Alcohol use: Not Currently    Comment: once a year   Drug use: Never    Pertinent Clinical Results:  LABS:   Lab Results  Component Value Date   WBC 8.4 03/08/2022   HGB 12.8 03/08/2022   HCT 40.3 03/08/2022   MCV 91.6 03/08/2022   PLT 265 03/08/2022   Lab Results  Component Value Date   NA 143 03/08/2022   K 3.6 03/08/2022   CO2 20 (L) 03/08/2022   GLUCOSE 91 03/08/2022   BUN 18 03/08/2022   CREATININE 0.93 03/08/2022   CALCIUM 7.4 (L) 03/08/2022   GFRNONAA >60 03/08/2022    ECG: Date: 11/19/2022 Time ECG obtained: 0815 AM Rate: 46 bpm Rhythm: sinus bradycardia Axis (leads I and aVF): Normal Intervals: PR 126 ms. QRS 78 ms. QTc 413 ms. ST segment and T wave changes: No evidence of acute ST segment elevation or depression Comparison: Similar to previous tracing obtained on 05/04/2022   IMAGING / PROCEDURES: CT CHEST WO CONTRAST performed on 07/01/2022 Two small subpleural nodules are unchanged and likely lymph nodes but cannot be definitively characterized. Then future CT at 18-24 months (from today's scan) is considered optional for low-risk patients, but is recommended for high-risk patients. This recommendation follows the consensus statement: Guidelines for Management of Incidental Pulmonary Nodules Detected on CT Images: From the Fleischner Society 2017; Radiology 2017; 284:228-243. Aortic atherosclerosis   CT CORONARY MORPH W/CTA COR W/SCORE W/CA W/CM &/OR WO/CM performed on 04/12/2022 Coronary calcium score of 438. This was 76th percentile for age and sex matched  control. Normal coronary origin with right dominance. Mild stenosis in the proximal LAD and RCA (25-49%). CAD-RADS 2. Mild non-obstructive CAD (25-49%). Consider non-atherosclerotic causes of chest pain. Consider preventive therapy and risk factor modification. No acute or significant extracardiac findings. Stable bilateral pulmonary nodules, measuring up to 6 mm in the right lower lobe. See CT chest report from 03/07/2022 for follow-up recommendations.  LONG TERM CARDIAC EVENT MONITOR STUDY performed on 03/22/2022 - 03/25/2022 Predominant atrial fibrillation Mean heart rate of 76 bpm Heart rate range 51 to 138 bpm.   TRANSTHORACIC ECHOCARDIOGRAM performed on 12/23/2021 Normal left ventricular systolic function with an EF of >55%  No regional wall motion abnormalities  Left ventricular diastolic Doppler parameters consistent with abnormal relaxation (G1DD). Right ventricular size and function normal Trivial AR  Mild MR, TR, PR Normal gradients; no valvular stenosis No pericardial effusion  Impression and Plan:  Bonnie Crawford has been  referred for pre-anesthesia review and clearance prior to her undergoing the planned anesthetic and procedural courses. Available labs, pertinent testing, and imaging results were personally reviewed by me in preparation for upcoming operative/procedural course. [facility] medical record has been updated following extensive record review and patient interview with PAT staff.   This patient has been appropriately cleared by cardiology (MODERATE) and by internal medicine (MODERATE) with the individually indicated risk of significant perioperative complications. Based on clinical review performed today (11/19/22), barring any significant acute changes in the patient's overall condition, it is anticipated that she will be able to proceed with the planned surgical intervention. Any acute changes in clinical condition may necessitate her procedure being postponed  and/or cancelled. Patient will meet with anesthesia team (MD and/or CRNA) on the day of her procedure for preoperative evaluation/assessment. Questions regarding anesthetic course will be fielded at that time.   Pre-surgical instructions were reviewed with the patient during her PAT appointment, and questions were fielded to satisfaction by PAT clinical staff. She has been instructed on which medications that she will need to hold prior to surgery, as well as the ones that have been deemed safe/appropriate to take on the day of her procedure. As part of the general education provided by PAT, patient made aware both verbally and in writing, that she would need to abstain from the use of any illegal substances during her perioperative course.  She was advised that failure to follow the provided instructions could necessitate case cancellation or result in serious perioperative complications up to and including death. Patient encouraged to contact PAT and/or her surgeon's office to discuss any questions or concerns that may arise prior to surgery; verbalized understanding.   Quentin Mulling, MSN, APRN, FNP-C, CEN [facility]  Peri-operative Services Nurse Practitioner Phone: [phone] Fax: [phone] 11/19/22 9:14 AM  NOTE: This note has been prepared using Dragon dictation software. Despite my best ability to proofread, there is always the potential that unintentional transcriptional errors may still occur from this process.

## 2022-11-19 ENCOUNTER — Encounter: Payer: Self-pay | Admitting: General Surgery

## 2022-11-21 MED ORDER — CEFAZOLIN SODIUM-DEXTROSE 2-4 GM/100ML-% IV SOLN
2.0000 g | INTRAVENOUS | Status: AC
  Administered 2022-11-22: 2 g via INTRAVENOUS

## 2022-11-21 MED ORDER — CHLORHEXIDINE GLUCONATE 0.12 % MT SOLN
15.0000 mL | Freq: Once | OROMUCOSAL | Status: AC
  Administered 2022-11-22: 15 mL via OROMUCOSAL

## 2022-11-21 MED ORDER — ORAL CARE MOUTH RINSE: 15.0000 mL | Freq: Once | OROMUCOSAL | Status: AC

## 2022-11-21 MED ORDER — LACTATED RINGERS IV SOLN: INTRAVENOUS | Status: DC

## 2022-11-22 ENCOUNTER — Ambulatory Visit: Payer: Medicare HMO | Admitting: Urgent Care

## 2022-11-22 ENCOUNTER — Other Ambulatory Visit: Payer: Self-pay

## 2022-11-22 ENCOUNTER — Encounter: Payer: Self-pay | Admitting: General Surgery

## 2022-11-22 ENCOUNTER — Ambulatory Visit
Admission: RE | Admit: 2022-11-22 | Discharge: 2022-11-22 | Disposition: A | Discharge status: Home / Self Care | Payer: Medicare HMO | Attending: General Surgery | Admitting: General Surgery

## 2022-11-22 ENCOUNTER — Encounter
Admission: RE | Disposition: A | Discharge status: Home / Self Care | Payer: Self-pay | Source: Home / Self Care | Attending: General Surgery

## 2022-11-22 DIAGNOSIS — I25118 Atherosclerotic heart disease of native coronary artery with other forms of angina pectoris: Secondary | ICD-10-CM

## 2022-11-22 DIAGNOSIS — D1723 Benign lipomatous neoplasm of skin and subcutaneous tissue of right leg: Secondary | ICD-10-CM | POA: Insufficient documentation

## 2022-11-22 DIAGNOSIS — I10 Essential (primary) hypertension: Secondary | ICD-10-CM

## 2022-11-22 DIAGNOSIS — E785 Hyperlipidemia, unspecified: Secondary | ICD-10-CM | POA: Diagnosis not present

## 2022-11-22 DIAGNOSIS — I2510 Atherosclerotic heart disease of native coronary artery without angina pectoris: Secondary | ICD-10-CM | POA: Diagnosis not present

## 2022-11-22 DIAGNOSIS — E039 Hypothyroidism, unspecified: Secondary | ICD-10-CM | POA: Insufficient documentation

## 2022-11-22 DIAGNOSIS — R7303 Prediabetes: Secondary | ICD-10-CM | POA: Insufficient documentation

## 2022-11-22 DIAGNOSIS — D6851 Activated protein C resistance: Secondary | ICD-10-CM | POA: Insufficient documentation

## 2022-11-22 DIAGNOSIS — Z87891 Personal history of nicotine dependence: Secondary | ICD-10-CM | POA: Insufficient documentation

## 2022-11-22 DIAGNOSIS — Z7901 Long term (current) use of anticoagulants: Secondary | ICD-10-CM | POA: Insufficient documentation

## 2022-11-22 DIAGNOSIS — N1831 Chronic kidney disease, stage 3a: Secondary | ICD-10-CM | POA: Diagnosis not present

## 2022-11-22 DIAGNOSIS — R079 Chest pain, unspecified: Secondary | ICD-10-CM

## 2022-11-22 DIAGNOSIS — I129 Hypertensive chronic kidney disease with stage 1 through stage 4 chronic kidney disease, or unspecified chronic kidney disease: Secondary | ICD-10-CM | POA: Insufficient documentation

## 2022-11-22 DIAGNOSIS — I480 Paroxysmal atrial fibrillation: Secondary | ICD-10-CM | POA: Diagnosis not present

## 2022-11-22 DIAGNOSIS — D2121 Benign neoplasm of connective and other soft tissue of right lower limb, including hip: Secondary | ICD-10-CM | POA: Diagnosis not present

## 2022-11-22 DIAGNOSIS — I4891 Unspecified atrial fibrillation: Secondary | ICD-10-CM | POA: Diagnosis not present

## 2022-11-22 HISTORY — DX: Cardiac murmur, unspecified: R01.1

## 2022-11-22 HISTORY — DX: Lymphedema, not elsewhere classified: I89.0

## 2022-11-22 HISTORY — PX: MASS EXCISION: SHX2000

## 2022-11-22 HISTORY — DX: Supraventricular tachycardia, unspecified: I47.10

## 2022-11-22 HISTORY — DX: Long term (current) use of anticoagulants: Z79.01

## 2022-11-22 HISTORY — DX: Atherosclerosis of aorta: I70.0

## 2022-11-22 HISTORY — DX: Other long term (current) drug therapy: Z79.899

## 2022-11-22 HISTORY — DX: Bradycardia, unspecified: R00.1

## 2022-11-22 HISTORY — DX: Unspecified asthma, uncomplicated: J45.909

## 2022-11-22 HISTORY — DX: Nontoxic multinodular goiter: E04.2

## 2022-11-22 HISTORY — DX: Ventricular premature depolarization: I49.3

## 2022-11-22 HISTORY — DX: Chronic kidney disease, stage 3 unspecified: N18.30

## 2022-11-22 HISTORY — DX: Benign neoplasm of connective and other soft tissue of right lower limb, including hip: D21.21

## 2022-11-22 HISTORY — DX: Unspecified hemorrhoids: K64.9

## 2022-11-22 SURGERY — EXCISION MASS
Anesthesia: General | Site: Leg Lower | Laterality: Right | Wound class: Clean

## 2022-11-22 MED ORDER — FENTANYL CITRATE (PF) 100 MCG/2ML IJ SOLN
25.0000 ug | INTRAMUSCULAR | Status: DC | PRN
  Administered 2022-11-22: 25 ug via INTRAVENOUS

## 2022-11-22 MED ORDER — EPHEDRINE SULFATE (PRESSORS) 50 MG/ML IJ SOLN
INTRAMUSCULAR | Status: DC | PRN
  Administered 2022-11-22: 5 mg via INTRAVENOUS

## 2022-11-22 MED ORDER — KETAMINE HCL 10 MG/ML IJ SOLN
INTRAMUSCULAR | Status: DC | PRN
  Administered 2022-11-22: 10 mg via INTRAVENOUS

## 2022-11-22 MED ORDER — PROPOFOL 1000 MG/100ML IV EMUL
INTRAVENOUS | Status: AC
  Filled 2022-11-22: qty 100

## 2022-11-22 MED ORDER — HYDROCODONE-ACETAMINOPHEN 5-325 MG PO TABS: 1.0000 | ORAL_TABLET | ORAL | 0 refills | Status: AC | PRN

## 2022-11-22 MED ORDER — GLYCOPYRROLATE 0.2 MG/ML IJ SOLN
INTRAMUSCULAR | Status: DC | PRN
  Administered 2022-11-22: .2 mg via INTRAVENOUS

## 2022-11-22 MED ORDER — OXYCODONE HCL 5 MG/5ML PO SOLN: 5.0000 mg | Freq: Once | ORAL | Status: AC | PRN

## 2022-11-22 MED ORDER — BUPIVACAINE-EPINEPHRINE 0.5% -1:200000 IJ SOLN
INTRAMUSCULAR | Status: DC | PRN
  Administered 2022-11-22: 30 mL

## 2022-11-22 MED ORDER — MIDAZOLAM HCL 2 MG/2ML IJ SOLN
INTRAMUSCULAR | Status: DC | PRN
  Administered 2022-11-22: 2 mg via INTRAVENOUS

## 2022-11-22 MED ORDER — ACETAMINOPHEN 10 MG/ML IV SOLN
INTRAVENOUS | Status: AC
  Filled 2022-11-22: qty 100

## 2022-11-22 MED ORDER — EPINEPHRINE PF 1 MG/ML IJ SOLN
INTRAMUSCULAR | Status: AC
  Filled 2022-11-22: qty 1

## 2022-11-22 MED ORDER — OXYCODONE HCL 5 MG PO TABS
5.0000 mg | ORAL_TABLET | Freq: Once | ORAL | Status: AC | PRN
  Administered 2022-11-22: 5 mg via ORAL

## 2022-11-22 MED ORDER — OXYCODONE HCL 5 MG PO TABS
ORAL_TABLET | ORAL | Status: AC
  Filled 2022-11-22: qty 1

## 2022-11-22 MED ORDER — FENTANYL CITRATE (PF) 100 MCG/2ML IJ SOLN
INTRAMUSCULAR | Status: DC | PRN
  Administered 2022-11-22 (×2): 25 ug via INTRAVENOUS
  Administered 2022-11-22: 50 ug via INTRAVENOUS

## 2022-11-22 MED ORDER — ONDANSETRON HCL 4 MG/2ML IJ SOLN
INTRAMUSCULAR | Status: DC | PRN
  Administered 2022-11-22: 8 mg via INTRAVENOUS

## 2022-11-22 MED ORDER — ACETAMINOPHEN 10 MG/ML IV SOLN
INTRAVENOUS | Status: DC | PRN
  Administered 2022-11-22: 1000 mg via INTRAVENOUS

## 2022-11-22 MED ORDER — CEFAZOLIN SODIUM-DEXTROSE 2-4 GM/100ML-% IV SOLN
INTRAVENOUS | Status: AC
  Filled 2022-11-22: qty 100

## 2022-11-22 MED ORDER — PROPOFOL 500 MG/50ML IV EMUL
INTRAVENOUS | Status: DC | PRN
  Administered 2022-11-22: 145 ug/kg/min via INTRAVENOUS

## 2022-11-22 MED ORDER — FENTANYL CITRATE (PF) 100 MCG/2ML IJ SOLN
INTRAMUSCULAR | Status: AC
  Filled 2022-11-22: qty 2

## 2022-11-22 MED ORDER — CHLORHEXIDINE GLUCONATE 0.12 % MT SOLN
OROMUCOSAL | Status: AC
  Filled 2022-11-22: qty 15

## 2022-11-22 MED ORDER — BUPIVACAINE HCL (PF) 0.5 % IJ SOLN
INTRAMUSCULAR | Status: AC
  Filled 2022-11-22: qty 30

## 2022-11-22 MED ORDER — KETAMINE HCL 50 MG/5ML IJ SOSY
PREFILLED_SYRINGE | INTRAMUSCULAR | Status: AC
  Filled 2022-11-22: qty 5

## 2022-11-22 MED ORDER — MIDAZOLAM HCL 2 MG/2ML IJ SOLN
INTRAMUSCULAR | Status: AC
  Filled 2022-11-22: qty 2

## 2022-11-22 MED ORDER — PHENYLEPHRINE 80 MCG/ML (10ML) SYRINGE FOR IV PUSH (FOR BLOOD PRESSURE SUPPORT)
PREFILLED_SYRINGE | INTRAVENOUS | Status: DC | PRN
  Administered 2022-11-22: 160 ug via INTRAVENOUS

## 2022-11-22 MED ORDER — PROPOFOL 10 MG/ML IV BOLUS
INTRAVENOUS | Status: DC | PRN
  Administered 2022-11-22 (×2): 20 mg via INTRAVENOUS

## 2022-11-22 SURGICAL SUPPLY — 32 items
ADH SKN CLS APL DERMABOND .7 (GAUZE/BANDAGES/DRESSINGS) ×1
APL PRP STRL LF DISP 70% ISPRP (MISCELLANEOUS) ×1
BNDG CMPR 5X4 CHSV STRCH STRL (GAUZE/BANDAGES/DRESSINGS) ×1
BNDG COHESIVE 4X5 TAN STRL LF (GAUZE/BANDAGES/DRESSINGS) IMPLANT
CHLORAPREP W/TINT 26 (MISCELLANEOUS) ×1 IMPLANT
DERMABOND ADVANCED .7 DNX12 (GAUZE/BANDAGES/DRESSINGS) ×1 IMPLANT
DRAPE EXTREMITY 106X87X128.5 (DRAPES) IMPLANT
DRAPE LAPAROTOMY 100X77 ABD (DRAPES) ×1 IMPLANT
ELECT CAUTERY BLADE 6.4 (BLADE) ×1 IMPLANT
ELECT REM PT RETURN 9FT ADLT (ELECTROSURGICAL) ×1
ELECTRODE REM PT RTRN 9FT ADLT (ELECTROSURGICAL) ×1 IMPLANT
GLOVE BIO SURGEON STRL SZ 6.5 (GLOVE) ×1 IMPLANT
GLOVE BIOGEL PI IND STRL 6.5 (GLOVE) ×1 IMPLANT
GOWN STRL REUS W/ TWL LRG LVL3 (GOWN DISPOSABLE) ×2 IMPLANT
GOWN STRL REUS W/TWL LRG LVL3 (GOWN DISPOSABLE) ×2
KIT TURNOVER KIT A (KITS) ×1 IMPLANT
LABEL OR SOLS (LABEL) ×1 IMPLANT
MANIFOLD NEPTUNE II (INSTRUMENTS) ×1 IMPLANT
NDL HYPO 25X1 1.5 SAFETY (NEEDLE) ×1 IMPLANT
NEEDLE HYPO 25X1 1.5 SAFETY (NEEDLE) ×1 IMPLANT
NS IRRIG 500ML POUR BTL (IV SOLUTION) ×1 IMPLANT
PACK BASIN MINOR ARMC (MISCELLANEOUS) ×1 IMPLANT
STOCKINETTE IMPERV 14X48 (MISCELLANEOUS) IMPLANT
SUT ETHILON 3-0 (SUTURE) IMPLANT
SUT MNCRL 4-0 (SUTURE) ×1
SUT MNCRL 4-0 27XMFL (SUTURE) ×1
SUT VIC AB 3-0 SH 27 (SUTURE) ×1
SUT VIC AB 3-0 SH 27X BRD (SUTURE) ×1 IMPLANT
SUTURE MNCRL 4-0 27XMF (SUTURE) ×1 IMPLANT
SYR 10ML LL (SYRINGE) ×1 IMPLANT
TRAP FLUID SMOKE EVACUATOR (MISCELLANEOUS) ×1 IMPLANT
WATER STERILE IRR 500ML POUR (IV SOLUTION) ×1 IMPLANT

## 2022-11-22 NOTE — Anesthesia Preprocedure Evaluation (Signed)
Anesthesia Evaluation  Patient identified by MRN, date of birth, ID band Patient awake    Reviewed: Allergy & Precautions, NPO status , Patient's Chart, lab work & pertinent test results  History of Anesthesia Complications Negative for: history of anesthetic complications  Airway Mallampati: III  TM Distance: <3 FB Neck ROM: full    Dental  (+) Chipped   Pulmonary neg shortness of breath, asthma , former smoker   Pulmonary exam normal        Cardiovascular Exercise Tolerance: Good hypertension, (-) angina + CAD  + dysrhythmias Atrial Fibrillation  Rhythm:regular Rate:Bradycardia     Neuro/Psych negative neurological ROS  negative psych ROS   GI/Hepatic Neg liver ROS,GERD  Controlled,,  Endo/Other  Hypothyroidism    Renal/GU Renal disease  negative genitourinary   Musculoskeletal   Abdominal   Peds  Hematology negative hematology ROS (+)   Anesthesia Other Findings Past Medical History: No date: Anxiety No date: Aortic atherosclerosis (HCC) No date: Arthritis 02/2022: Atrial fibrillation (HCC)     Comment:  a.) Dx'd 02/2022; b.) CHA2DS2VASc = 5 (age x2, sex, HTN,              vascular disease history);  c.) s/p DCCV (75J x1)               05/04/2022; d.) rate/rhythm maintained on oral amiodarone              + atenolol; chronically anticoagulated with apixaban No date: 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 04/12/2022: CAD (coronary artery disease)     Comment:  a.) cCTA 04/12/2022: Ca2+ 438 (76th percentile for               age/sex/race matched control) No date: Cardiac murmur No date: Cervical spondylosis No date: Childhood asthma No date: CKD (chronic kidney disease), stage III (HCC) No date: Colon polyp 12/23/2021: Diastolic dysfunction     Comment:  a.) TTE 12/23/2021: EF >55%, triv AR, mild MR/TR/PR,               G1DD No date: Diverticulosis No date: Factor V Leiden (HCC) No  date: GERD (gastroesophageal reflux disease) No date: Hemorrhoids No date: Herpes zoster 2022: History of bilateral cataract extraction 07/15/2010: History of cardiac catheterization     Comment:  a.) LHC 07/15/2010: normal coronaries and hemodynamics;               b.) LHC 04/05/2018: normal coronaries No date: History of kidney stones No date: Hyperlipidemia No date: Hypertension No date: Hypothyroidism No date: Inflammatory bowel disease 09/17/2015: Lipoma No date: Long term current use of amiodarone No date: Long term current use of anticoagulant     Comment:  a.) apixaban No date: Lymphedema No date: MGUS (monoclonal gammopathy of unknown significance) No date: Multinodular goiter No date: Osteoporosis No date: PAT (paroxysmal atrial tachycardia) No date: Prediabetes No date: PSVT (paroxysmal supraventricular tachycardia) No date: PVC's (premature ventricular contractions) No date: Sinus bradycardia  Past Surgical History: No date: ABDOMINAL HYSTERECTOMY No date: APPENDECTOMY No date: BLEPHAROPLASTY; Bilateral No date: BREAST CYST ASPIRATION; Left     Comment:  neg 05/04/2022: CARDIOVERSION; N/A     Comment:  Procedure: CARDIOVERSION;  Surgeon: Marcina Millard, MD;  Location: [facility] ORS;  Service:               Cardiovascular;  Laterality: N/A; 07/28/2020: CATARACT EXTRACTION W/PHACO; Left  Comment:  Procedure: CATARACT EXTRACTION PHACO AND INTRAOCULAR               LENS PLACEMENT (IOC) LEFT PANOPTIX TORIC 4.25 00:30.9;                Surgeon: Nevada Crane, MD;  [facility];  Service: Ophthalmology;  Laterality: Left; 08/18/2020: CATARACT EXTRACTION W/PHACO; Right     Comment:  Procedure: CATARACT EXTRACTION PHACO AND INTRAOCULAR               LENS PLACEMENT (IOC) RIGHT PANOPTIX TORIC 2.31 00:20.0;                Surgeon: Nevada Crane, MD;  Location: [facility]               SUR[facility]ERY [facility];  Service:  Ophthalmology;  Laterality:               Right; No date: COLONOSCOPY No date: KNEE SURGERY; Left     Comment:  2 meniscus tears 07/15/2010: LEFT HEART CATH AND CORONARY ANGIOGRAPHY; Left     Comment:  Procedure: LEFT HEART CATH AND CORONARY ANGIOGRAPHY;               Location: [facility]; Surgeon:              Vilinda Boehringer, MD 04/05/2018: LEFT HEART CATH AND CORONARY ANGIOGRAPHY; Left     Comment:  Procedure: LEFT HEART CATH AND CORONARY ANGIOGRAPHY;               Location: [facility]; Surgeon:              Rodman Key, MD No date: NECK SURGERY     Comment:  cosmetic  BMI    Body Mass Index: 29.68 kg/m      Reproductive/Obstetrics negative OB ROS                             Anesthesia Physical Anesthesia Plan  ASA: 3  Anesthesia Plan: General   Post-op Pain Management:    Induction: Intravenous  PONV Risk Score and Plan: Propofol infusion and TIVA  Airway Management Planned: Natural Airway and Nasal Cannula  Additional Equipment:   Intra-op Plan:   Post-operative Plan:   Informed Consent: I have reviewed the patients History and Physical, chart, labs and discussed the procedure including the risks, benefits and alternatives for the proposed anesthesia with the patient or authorized representative who has indicated his/her understanding and acceptance.     Dental Advisory Given  Plan Discussed with: Anesthesiologist, CRNA and Surgeon  Anesthesia Plan Comments: (Patient consented for risks of anesthesia including but not limited to:  - adverse reactions to medications - risk of airway placement if required - damage to eyes, teeth, lips or other oral mucosa - nerve damage due to positioning  - sore throat or hoarseness - Damage to heart, brain, nerves, lungs, other parts of body or loss of life  Patient voiced understanding.)       Anesthesia Quick Evaluation

## 2022-11-22 NOTE — Interval H&P Note (Signed)
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  The various methods of treatment have been discussed with the patient and family. After consideration of risks, benefits and other options for treatment, the patient has consented to  Procedure(s): EXCISION MASS (Right) as a surgical intervention.  The patient's history has been reviewed, patient examined, no change in status, stable for surgery.  I have reviewed the patient's chart and labs.  Questions were answered to the patient's satisfaction.     Carolan Shiver

## 2022-11-22 NOTE — Anesthesia Postprocedure Evaluation (Signed)
Anesthesia Post Note  Patient: Bonnie Crawford  Procedure(s) Performed: EXCISION MASS (Right: Leg Lower)  Patient location during evaluation: PACU Anesthesia Type: General Level of consciousness: awake and alert Pain management: pain level controlled Vital Signs Assessment: post-procedure vital signs reviewed and stable Respiratory status: spontaneous breathing, nonlabored ventilation, respiratory function stable and patient connected to nasal cannula oxygen Cardiovascular status: blood pressure returned to baseline and stable Postop Assessment: no apparent nausea or vomiting Anesthetic complications: no   No notable events documented.   Last Vitals:  Vitals:   11/22/22 1315 11/22/22 1330  BP: (!) 140/68 (!) 141/71  Pulse: (!) 52 (!) 52  Resp: 12 12  Temp:    SpO2: 100% 97%    Last Pain:  Vitals:   11/22/22 1330  TempSrc:   PainSc: 3                  [name]

## 2022-11-22 NOTE — Discharge Instructions (Addendum)
  Diet: Resume home heart healthy regular diet.   Activity: No heavy lifting >20 pounds (children, pets, laundry, garbage) or strenuous activity until follow-up, but light activity and walking are encouraged. Do not drive or drink alcohol if taking narcotic pain medications.  Wound care: May shower with soapy water and pat dry (do not rub incisions), but no baths or submerging incision underwater until follow-up. (no swimming)   Medications: Resume all home medications. For mild to moderate pain: acetaminophen (Tylenol) or ibuprofen (if no kidney disease). Combining Tylenol with alcohol can substantially increase your risk of causing liver disease. Narcotic pain medications, if prescribed, can be used for severe pain, though may cause nausea, constipation, and drowsiness. Do not combine Tylenol and Norco within a 6 hour period as Norco contains Tylenol. If you do not need the narcotic pain medication, you do not need to fill the prescription.  Call office [phone]) at any time if any questions, worsening pain, fevers/chills, bleeding, drainage from incision site, or other concerns.   AMBULATORY SURGERY  DISCHARGE INSTRUCTIONS   The drugs that you were given will stay in your system until tomorrow so for the next 24 hours you should not:  Drive an automobile Make any legal decisions Drink any alcoholic beverage   You may resume regular meals tomorrow.  Today it is better to start with liquids and gradually work up to solid foods.  You may eat anything you prefer, but it is better to start with liquids, then soup and crackers, and gradually work up to solid foods.   Please notify your doctor immediately if you have any unusual bleeding, trouble breathing, redness and pain at the surgery site, drainage, fever, or pain not relieved by medication.    Additional Instructions: Do Lovenox tonight and start Eliquis tomorrow 11/23/2022    Please contact your physician with any problems  or Same Day Surgery at [phone], Monday through Friday 6 am to 4 pm, or [place] at [facility] number at [phone].

## 2022-11-22 NOTE — Anesthesia Procedure Notes (Addendum)
Procedure Name: General with mask airway Date/Time: 11/22/2022 11:24 AM  Performed by: Mohammed Kindle, CRNAPre-anesthesia Checklist: Patient identified, Emergency Drugs available, Suction available and Patient being monitored Patient Re-evaluated:Patient Re-evaluated prior to induction Oxygen Delivery Method: Simple face mask Induction Type: IV induction Placement Confirmation: positive ETCO2, CO2 detector and breath sounds checked- equal and bilateral Dental Injury: Teeth and Oropharynx as per pre-operative assessment

## 2022-11-22 NOTE — Op Note (Signed)
OPERATION REPORT  Pre Operative Diagnosis: Right leg mass  Post operative diagnosis: Same  Anesthesia: MAC and Local   Surgeon: Dr. Hazle Quant   Indication: This 82 y.o. year old female with a soft tissue mass that is causing pain, rubbing and not able to use compression stockings.    Description of procedure: after orienting patient about the procedure steps and benefits and patient agreed to proceed. Time out was done identifying correct patient and location of procedure. After induction of monitored sedation, local anesthesia was infiltrated around the palpable lesion. With a blade #15, an elliptical incision was made using the skin lines. Sharp dissection was carried down to the tibial bone and lesion was excised including dermal tissue. The mass measured 6 x 4 cm. Deep dermal stitches were done with vicryl 4-0 to repair the laceration and skin closed with Monocryl 4-0 in subcuticular fashion. Specimen sent to pathology.    Complications: none   EBL: 10 mL  Carolan Shiver, MD, FACS

## 2022-11-22 NOTE — Transfer of Care (Signed)
Immediate Anesthesia Transfer of Care Note  Patient: Bonnie Crawford  Procedure(s) Performed: EXCISION MASS (Right: Leg Lower)  Patient Location: PACU  Anesthesia Type:General  Level of Consciousness: drowsy and patient cooperative  Airway & Oxygen Therapy: Patient Spontanous Breathing and Patient connected to face mask oxygen  Post-op Assessment: Report given to RN and Post -op Vital signs reviewed and stable  Post vital signs: Reviewed and stable  Last Vitals:  Vitals Value Taken Time  BP 120/60 11/22/22 1219  Temp    Pulse 69 11/22/22 1222  Resp 16 11/22/22 1220  SpO2 100 % 11/22/22 1222  Vitals shown include unvalidated device data.  Last Pain:  Vitals:   11/22/22 1003  TempSrc: Temporal  PainSc: 2          Complications: No notable events documented.

## 2022-11-23 ENCOUNTER — Encounter: Payer: Self-pay | Admitting: General Surgery

## 2022-12-06 ENCOUNTER — Ambulatory Visit
Admission: RE | Admit: 2022-12-06 | Discharge: 2022-12-06 | Disposition: A | Discharge status: Home / Self Care | Payer: Medicare HMO | Source: Ambulatory Visit | Attending: Internal Medicine | Admitting: Internal Medicine

## 2022-12-06 DIAGNOSIS — Z1231 Encounter for screening mammogram for malignant neoplasm of breast: Secondary | ICD-10-CM | POA: Diagnosis not present

## 2022-12-22 DIAGNOSIS — I890 Lymphedema, not elsewhere classified: Secondary | ICD-10-CM | POA: Diagnosis not present

## 2022-12-22 DIAGNOSIS — R001 Bradycardia, unspecified: Secondary | ICD-10-CM | POA: Diagnosis not present

## 2022-12-22 DIAGNOSIS — I10 Essential (primary) hypertension: Secondary | ICD-10-CM | POA: Diagnosis not present

## 2022-12-22 DIAGNOSIS — I480 Paroxysmal atrial fibrillation: Secondary | ICD-10-CM | POA: Diagnosis not present

## 2023-01-20 DIAGNOSIS — I129 Hypertensive chronic kidney disease with stage 1 through stage 4 chronic kidney disease, or unspecified chronic kidney disease: Secondary | ICD-10-CM | POA: Diagnosis not present

## 2023-01-20 DIAGNOSIS — E785 Hyperlipidemia, unspecified: Secondary | ICD-10-CM | POA: Diagnosis not present

## 2023-01-20 DIAGNOSIS — E039 Hypothyroidism, unspecified: Secondary | ICD-10-CM | POA: Diagnosis not present

## 2023-01-20 DIAGNOSIS — R7303 Prediabetes: Secondary | ICD-10-CM | POA: Diagnosis not present

## 2023-01-20 DIAGNOSIS — N1831 Chronic kidney disease, stage 3a: Secondary | ICD-10-CM | POA: Diagnosis not present

## 2023-01-20 DIAGNOSIS — Z87891 Personal history of nicotine dependence: Secondary | ICD-10-CM | POA: Diagnosis not present

## 2023-01-20 DIAGNOSIS — Z79899 Other long term (current) drug therapy: Secondary | ICD-10-CM | POA: Diagnosis not present

## 2023-02-15 ENCOUNTER — Other Ambulatory Visit
Admission: RE | Admit: 2023-02-15 | Discharge: 2023-02-15 | Disposition: A | Discharge status: Home / Self Care | Payer: Medicare HMO | Source: Ambulatory Visit | Attending: Ophthalmology | Admitting: Ophthalmology

## 2023-02-15 ENCOUNTER — Other Ambulatory Visit: Payer: Self-pay | Admitting: Ophthalmology

## 2023-02-15 DIAGNOSIS — H3500 Unspecified background retinopathy: Secondary | ICD-10-CM

## 2023-02-15 DIAGNOSIS — H579 Unspecified disorder of eye and adnexa: Secondary | ICD-10-CM | POA: Diagnosis not present

## 2023-02-15 DIAGNOSIS — G453 Amaurosis fugax: Secondary | ICD-10-CM

## 2023-02-15 LAB — CBC
HCT: 44.2 % (ref 36.0–46.0)
Hemoglobin: 14 g/dL (ref 12.0–15.0)
MCH: 29.8 pg (ref 26.0–34.0)
MCHC: 31.7 g/dL (ref 30.0–36.0)
MCV: 94 fL (ref 80.0–100.0)
Platelets: 258 10*3/uL (ref 150–400)
RBC: 4.7 MIL/uL (ref 3.87–5.11)
RDW: 13.5 % (ref 11.5–15.5)
WBC: 6.4 10*3/uL (ref 4.0–10.5)
nRBC: 0 % (ref 0.0–0.2)

## 2023-02-15 LAB — LIPID PANEL
Cholesterol: 189 mg/dL (ref 0–200)
HDL: 69 mg/dL (ref >40)
LDL Cholesterol: 96 mg/dL (ref 0–99)
Total CHOL/HDL Ratio: 2.7 {ratio}
Triglycerides: 121 mg/dL (ref <150)
VLDL: 24 mg/dL (ref 0–40)

## 2023-02-15 LAB — C-REACTIVE PROTEIN: CRP: 0.7 mg/dL (ref <1.0)

## 2023-02-15 LAB — GLUCOSE, RANDOM: Glucose, Bld: 92 mg/dL (ref 70–99)

## 2023-02-15 LAB — HEMOGLOBIN A1C
Hgb A1c MFr Bld: 5.7 % — ABNORMAL HIGH (ref 4.8–5.6)
Mean Plasma Glucose: 116.89 mg/dL

## 2023-02-15 LAB — SEDIMENTATION RATE: Sed Rate: 6 mm/h (ref 0–30)

## 2023-02-16 ENCOUNTER — Ambulatory Visit
Admission: RE | Admit: 2023-02-16 | Discharge: 2023-02-16 | Disposition: A | Discharge status: Home / Self Care | Payer: Medicare HMO | Source: Ambulatory Visit | Attending: Ophthalmology | Admitting: Ophthalmology

## 2023-02-16 DIAGNOSIS — H3500 Unspecified background retinopathy: Secondary | ICD-10-CM

## 2023-02-16 DIAGNOSIS — G453 Amaurosis fugax: Secondary | ICD-10-CM | POA: Insufficient documentation

## 2023-02-16 DIAGNOSIS — I6782 Cerebral ischemia: Secondary | ICD-10-CM | POA: Diagnosis not present

## 2023-02-16 DIAGNOSIS — H53132 Sudden visual loss, left eye: Secondary | ICD-10-CM | POA: Diagnosis not present

## 2023-02-16 DIAGNOSIS — E785 Hyperlipidemia, unspecified: Secondary | ICD-10-CM | POA: Diagnosis not present

## 2023-02-16 DIAGNOSIS — H547 Unspecified visual loss: Secondary | ICD-10-CM | POA: Diagnosis not present

## 2023-02-16 DIAGNOSIS — R9089 Other abnormal findings on diagnostic imaging of central nervous system: Secondary | ICD-10-CM | POA: Diagnosis not present

## 2023-02-16 DIAGNOSIS — I10 Essential (primary) hypertension: Secondary | ICD-10-CM | POA: Diagnosis not present

## 2023-02-16 DIAGNOSIS — H5712 Ocular pain, left eye: Secondary | ICD-10-CM | POA: Diagnosis not present

## 2023-02-16 DIAGNOSIS — I6523 Occlusion and stenosis of bilateral carotid arteries: Secondary | ICD-10-CM | POA: Diagnosis not present

## 2023-02-16 MED ORDER — GADOBUTROL 1 MMOL/ML IV SOLN
7.5000 mL | Freq: Once | INTRAVENOUS | Status: AC | PRN
  Administered 2023-02-16: 7.5 mL via INTRAVENOUS

## 2023-02-17 LAB — PROTEIN ELECTROPHORESIS, SERUM
A/G Ratio: 1.2 (ref 0.7–1.7)
Albumin ELP: 3.5 g/dL (ref 2.9–4.4)
Alpha-1-Globulin: 0.3 g/dL (ref 0.0–0.4)
Alpha-2-Globulin: 0.8 g/dL (ref 0.4–1.0)
Beta Globulin: 1 g/dL (ref 0.7–1.3)
Gamma Globulin: 0.9 g/dL (ref 0.4–1.8)
Globulin, Total: 3 g/dL (ref 2.2–3.9)
M-Spike, %: 0.2 g/dL — ABNORMAL HIGH
Total Protein ELP: 6.5 g/dL (ref 6.0–8.5)

## 2023-02-19 DIAGNOSIS — H547 Unspecified visual loss: Secondary | ICD-10-CM | POA: Diagnosis not present

## 2023-02-19 DIAGNOSIS — H348122 Central retinal vein occlusion, left eye, stable: Secondary | ICD-10-CM | POA: Diagnosis not present

## 2023-02-19 DIAGNOSIS — H348192 Central retinal vein occlusion, unspecified eye, stable: Secondary | ICD-10-CM | POA: Diagnosis not present

## 2023-02-19 DIAGNOSIS — Z888 Allergy status to other drugs, medicaments and biological substances status: Secondary | ICD-10-CM | POA: Diagnosis not present

## 2023-02-19 DIAGNOSIS — D6859 Other primary thrombophilia: Secondary | ICD-10-CM | POA: Diagnosis not present

## 2023-02-19 DIAGNOSIS — I129 Hypertensive chronic kidney disease with stage 1 through stage 4 chronic kidney disease, or unspecified chronic kidney disease: Secondary | ICD-10-CM | POA: Diagnosis not present

## 2023-02-19 DIAGNOSIS — E785 Hyperlipidemia, unspecified: Secondary | ICD-10-CM | POA: Diagnosis not present

## 2023-02-19 DIAGNOSIS — I10 Essential (primary) hypertension: Secondary | ICD-10-CM | POA: Diagnosis not present

## 2023-02-19 DIAGNOSIS — E039 Hypothyroidism, unspecified: Secondary | ICD-10-CM | POA: Diagnosis not present

## 2023-02-19 DIAGNOSIS — N183 Chronic kidney disease, stage 3 unspecified: Secondary | ICD-10-CM | POA: Diagnosis not present

## 2023-02-19 DIAGNOSIS — R001 Bradycardia, unspecified: Secondary | ICD-10-CM | POA: Diagnosis not present

## 2023-02-19 DIAGNOSIS — I4891 Unspecified atrial fibrillation: Secondary | ICD-10-CM | POA: Diagnosis not present

## 2023-02-19 DIAGNOSIS — R519 Headache, unspecified: Secondary | ICD-10-CM | POA: Diagnosis not present

## 2023-02-19 DIAGNOSIS — H538 Other visual disturbances: Secondary | ICD-10-CM | POA: Diagnosis not present

## 2023-02-20 DIAGNOSIS — H348122 Central retinal vein occlusion, left eye, stable: Secondary | ICD-10-CM | POA: Diagnosis not present

## 2023-02-23 DIAGNOSIS — Z1379 Encounter for other screening for genetic and chromosomal anomalies: Secondary | ICD-10-CM | POA: Diagnosis not present

## 2023-02-23 DIAGNOSIS — Z8489 Family history of other specified conditions: Secondary | ICD-10-CM | POA: Diagnosis not present

## 2023-02-24 DIAGNOSIS — I480 Paroxysmal atrial fibrillation: Secondary | ICD-10-CM | POA: Diagnosis not present

## 2023-02-24 DIAGNOSIS — I4719 Other supraventricular tachycardia: Secondary | ICD-10-CM | POA: Diagnosis not present

## 2023-02-24 DIAGNOSIS — I479 Paroxysmal tachycardia, unspecified: Secondary | ICD-10-CM | POA: Diagnosis not present

## 2023-02-24 DIAGNOSIS — I493 Ventricular premature depolarization: Secondary | ICD-10-CM | POA: Diagnosis not present

## 2023-02-24 DIAGNOSIS — D6851 Activated protein C resistance: Secondary | ICD-10-CM | POA: Diagnosis not present

## 2023-02-24 DIAGNOSIS — D472 Monoclonal gammopathy: Secondary | ICD-10-CM | POA: Diagnosis not present

## 2023-02-24 DIAGNOSIS — I10 Essential (primary) hypertension: Secondary | ICD-10-CM | POA: Diagnosis not present

## 2023-02-24 DIAGNOSIS — E785 Hyperlipidemia, unspecified: Secondary | ICD-10-CM | POA: Diagnosis not present

## 2023-02-24 DIAGNOSIS — R001 Bradycardia, unspecified: Secondary | ICD-10-CM | POA: Diagnosis not present

## 2023-02-24 DIAGNOSIS — I2510 Atherosclerotic heart disease of native coronary artery without angina pectoris: Secondary | ICD-10-CM | POA: Diagnosis not present

## 2023-03-01 DIAGNOSIS — G453 Amaurosis fugax: Secondary | ICD-10-CM | POA: Diagnosis not present

## 2023-03-01 DIAGNOSIS — H348122 Central retinal vein occlusion, left eye, stable: Secondary | ICD-10-CM | POA: Diagnosis not present

## 2023-03-09 DIAGNOSIS — N1831 Chronic kidney disease, stage 3a: Secondary | ICD-10-CM | POA: Diagnosis not present

## 2023-03-16 DIAGNOSIS — I10 Essential (primary) hypertension: Secondary | ICD-10-CM | POA: Diagnosis not present

## 2023-03-16 DIAGNOSIS — N1831 Chronic kidney disease, stage 3a: Secondary | ICD-10-CM | POA: Diagnosis not present

## 2023-03-16 DIAGNOSIS — R600 Localized edema: Secondary | ICD-10-CM | POA: Diagnosis not present

## 2023-03-25 DIAGNOSIS — H348122 Central retinal vein occlusion, left eye, stable: Secondary | ICD-10-CM | POA: Diagnosis not present

## 2023-03-25 DIAGNOSIS — H359 Unspecified retinal disorder: Secondary | ICD-10-CM | POA: Diagnosis not present

## 2023-03-25 DIAGNOSIS — I480 Paroxysmal atrial fibrillation: Secondary | ICD-10-CM | POA: Diagnosis not present

## 2023-03-30 DIAGNOSIS — H348122 Central retinal vein occlusion, left eye, stable: Secondary | ICD-10-CM | POA: Diagnosis not present

## 2023-03-30 DIAGNOSIS — D6851 Activated protein C resistance: Secondary | ICD-10-CM | POA: Diagnosis not present

## 2023-03-30 DIAGNOSIS — D472 Monoclonal gammopathy: Secondary | ICD-10-CM | POA: Diagnosis not present

## 2023-05-04 DIAGNOSIS — Z23 Encounter for immunization: Secondary | ICD-10-CM | POA: Diagnosis not present

## 2023-05-04 DIAGNOSIS — Z1211 Encounter for screening for malignant neoplasm of colon: Secondary | ICD-10-CM | POA: Diagnosis not present

## 2023-05-04 DIAGNOSIS — E039 Hypothyroidism, unspecified: Secondary | ICD-10-CM | POA: Diagnosis not present

## 2023-05-04 DIAGNOSIS — Z79899 Other long term (current) drug therapy: Secondary | ICD-10-CM | POA: Diagnosis not present

## 2023-05-04 DIAGNOSIS — I4719 Other supraventricular tachycardia: Secondary | ICD-10-CM | POA: Diagnosis not present

## 2023-05-04 DIAGNOSIS — I493 Ventricular premature depolarization: Secondary | ICD-10-CM | POA: Diagnosis not present

## 2023-05-04 DIAGNOSIS — R001 Bradycardia, unspecified: Secondary | ICD-10-CM | POA: Diagnosis not present

## 2023-05-04 DIAGNOSIS — E785 Hyperlipidemia, unspecified: Secondary | ICD-10-CM | POA: Diagnosis not present

## 2023-05-04 DIAGNOSIS — I480 Paroxysmal atrial fibrillation: Secondary | ICD-10-CM | POA: Diagnosis not present

## 2023-05-04 DIAGNOSIS — R600 Localized edema: Secondary | ICD-10-CM | POA: Diagnosis not present

## 2023-05-04 DIAGNOSIS — I2510 Atherosclerotic heart disease of native coronary artery without angina pectoris: Secondary | ICD-10-CM | POA: Diagnosis not present

## 2023-05-04 DIAGNOSIS — D472 Monoclonal gammopathy: Secondary | ICD-10-CM | POA: Diagnosis not present

## 2023-05-04 DIAGNOSIS — I10 Essential (primary) hypertension: Secondary | ICD-10-CM | POA: Diagnosis not present

## 2023-05-04 DIAGNOSIS — Z0000 Encounter for general adult medical examination without abnormal findings: Secondary | ICD-10-CM | POA: Diagnosis not present

## 2023-05-06 DIAGNOSIS — D2371 Other benign neoplasm of skin of right lower limb, including hip: Secondary | ICD-10-CM | POA: Diagnosis not present

## 2023-05-06 DIAGNOSIS — B351 Tinea unguium: Secondary | ICD-10-CM | POA: Diagnosis not present

## 2023-05-06 DIAGNOSIS — M79674 Pain in right toe(s): Secondary | ICD-10-CM | POA: Diagnosis not present

## 2023-05-06 DIAGNOSIS — L600 Ingrowing nail: Secondary | ICD-10-CM | POA: Diagnosis not present

## 2023-05-06 DIAGNOSIS — M79675 Pain in left toe(s): Secondary | ICD-10-CM | POA: Diagnosis not present

## 2023-05-06 DIAGNOSIS — R7303 Prediabetes: Secondary | ICD-10-CM | POA: Diagnosis not present

## 2023-05-06 DIAGNOSIS — M898X9 Other specified disorders of bone, unspecified site: Secondary | ICD-10-CM | POA: Diagnosis not present

## 2023-05-25 DIAGNOSIS — E039 Hypothyroidism, unspecified: Secondary | ICD-10-CM | POA: Diagnosis not present

## 2023-05-25 DIAGNOSIS — I10 Essential (primary) hypertension: Secondary | ICD-10-CM | POA: Diagnosis not present

## 2023-05-25 DIAGNOSIS — R7303 Prediabetes: Secondary | ICD-10-CM | POA: Diagnosis not present

## 2023-05-25 DIAGNOSIS — Z79899 Other long term (current) drug therapy: Secondary | ICD-10-CM | POA: Diagnosis not present

## 2023-05-25 DIAGNOSIS — Z1211 Encounter for screening for malignant neoplasm of colon: Secondary | ICD-10-CM | POA: Diagnosis not present

## 2023-05-25 DIAGNOSIS — E785 Hyperlipidemia, unspecified: Secondary | ICD-10-CM | POA: Diagnosis not present

## 2023-06-13 DIAGNOSIS — H353134 Nonexudative age-related macular degeneration, bilateral, advanced atrophic with subfoveal involvement: Secondary | ICD-10-CM | POA: Diagnosis not present

## 2023-06-13 DIAGNOSIS — H348122 Central retinal vein occlusion, left eye, stable: Secondary | ICD-10-CM | POA: Diagnosis not present

## 2023-07-11 DIAGNOSIS — I10 Essential (primary) hypertension: Secondary | ICD-10-CM | POA: Diagnosis not present

## 2023-07-11 DIAGNOSIS — N1831 Chronic kidney disease, stage 3a: Secondary | ICD-10-CM | POA: Diagnosis not present

## 2023-07-18 DIAGNOSIS — I10 Essential (primary) hypertension: Secondary | ICD-10-CM | POA: Diagnosis not present

## 2023-07-18 DIAGNOSIS — R600 Localized edema: Secondary | ICD-10-CM | POA: Diagnosis not present

## 2023-07-18 DIAGNOSIS — N1831 Chronic kidney disease, stage 3a: Secondary | ICD-10-CM | POA: Diagnosis not present

## 2023-07-20 DIAGNOSIS — I4719 Other supraventricular tachycardia: Secondary | ICD-10-CM | POA: Diagnosis not present

## 2023-07-20 DIAGNOSIS — I480 Paroxysmal atrial fibrillation: Secondary | ICD-10-CM | POA: Diagnosis not present

## 2023-07-20 DIAGNOSIS — I10 Essential (primary) hypertension: Secondary | ICD-10-CM | POA: Diagnosis not present

## 2023-07-20 DIAGNOSIS — D6851 Activated protein C resistance: Secondary | ICD-10-CM | POA: Diagnosis not present

## 2023-07-20 DIAGNOSIS — R001 Bradycardia, unspecified: Secondary | ICD-10-CM | POA: Diagnosis not present

## 2023-07-20 DIAGNOSIS — I493 Ventricular premature depolarization: Secondary | ICD-10-CM | POA: Diagnosis not present

## 2023-07-20 DIAGNOSIS — E785 Hyperlipidemia, unspecified: Secondary | ICD-10-CM | POA: Diagnosis not present

## 2023-07-20 DIAGNOSIS — I2510 Atherosclerotic heart disease of native coronary artery without angina pectoris: Secondary | ICD-10-CM | POA: Diagnosis not present

## 2023-08-10 DIAGNOSIS — Z961 Presence of intraocular lens: Secondary | ICD-10-CM | POA: Diagnosis not present

## 2023-08-10 DIAGNOSIS — H348122 Central retinal vein occlusion, left eye, stable: Secondary | ICD-10-CM | POA: Diagnosis not present

## 2023-08-10 DIAGNOSIS — H04123 Dry eye syndrome of bilateral lacrimal glands: Secondary | ICD-10-CM | POA: Diagnosis not present

## 2023-08-10 DIAGNOSIS — H43813 Vitreous degeneration, bilateral: Secondary | ICD-10-CM | POA: Diagnosis not present

## 2023-08-23 DIAGNOSIS — L821 Other seborrheic keratosis: Secondary | ICD-10-CM | POA: Diagnosis not present

## 2023-08-23 DIAGNOSIS — D225 Melanocytic nevi of trunk: Secondary | ICD-10-CM | POA: Diagnosis not present

## 2023-08-23 DIAGNOSIS — D2261 Melanocytic nevi of right upper limb, including shoulder: Secondary | ICD-10-CM | POA: Diagnosis not present

## 2023-08-23 DIAGNOSIS — D2272 Melanocytic nevi of left lower limb, including hip: Secondary | ICD-10-CM | POA: Diagnosis not present

## 2023-08-23 DIAGNOSIS — D2271 Melanocytic nevi of right lower limb, including hip: Secondary | ICD-10-CM | POA: Diagnosis not present

## 2023-08-23 DIAGNOSIS — D2262 Melanocytic nevi of left upper limb, including shoulder: Secondary | ICD-10-CM | POA: Diagnosis not present

## 2023-08-24 DIAGNOSIS — E785 Hyperlipidemia, unspecified: Secondary | ICD-10-CM | POA: Diagnosis not present

## 2023-08-24 DIAGNOSIS — Z79899 Other long term (current) drug therapy: Secondary | ICD-10-CM | POA: Diagnosis not present

## 2023-08-24 DIAGNOSIS — E079 Disorder of thyroid, unspecified: Secondary | ICD-10-CM | POA: Diagnosis not present

## 2023-08-24 DIAGNOSIS — R7303 Prediabetes: Secondary | ICD-10-CM | POA: Diagnosis not present

## 2023-08-24 DIAGNOSIS — I480 Paroxysmal atrial fibrillation: Secondary | ICD-10-CM | POA: Diagnosis not present

## 2023-08-24 DIAGNOSIS — H1132 Conjunctival hemorrhage, left eye: Secondary | ICD-10-CM | POA: Diagnosis not present

## 2023-08-24 DIAGNOSIS — Z0000 Encounter for general adult medical examination without abnormal findings: Secondary | ICD-10-CM | POA: Diagnosis not present

## 2023-08-24 DIAGNOSIS — Z961 Presence of intraocular lens: Secondary | ICD-10-CM | POA: Diagnosis not present

## 2023-08-24 DIAGNOSIS — I10 Essential (primary) hypertension: Secondary | ICD-10-CM | POA: Diagnosis not present

## 2023-08-24 DIAGNOSIS — H04123 Dry eye syndrome of bilateral lacrimal glands: Secondary | ICD-10-CM | POA: Diagnosis not present

## 2023-10-19 DIAGNOSIS — H348122 Central retinal vein occlusion, left eye, stable: Secondary | ICD-10-CM | POA: Diagnosis not present

## 2023-10-19 DIAGNOSIS — Z961 Presence of intraocular lens: Secondary | ICD-10-CM | POA: Diagnosis not present

## 2023-10-19 DIAGNOSIS — H04123 Dry eye syndrome of bilateral lacrimal glands: Secondary | ICD-10-CM | POA: Diagnosis not present

## 2023-10-19 DIAGNOSIS — Z0100 Encounter for examination of eyes and vision without abnormal findings: Secondary | ICD-10-CM | POA: Diagnosis not present

## 2023-10-19 DIAGNOSIS — H43813 Vitreous degeneration, bilateral: Secondary | ICD-10-CM | POA: Diagnosis not present

## 2023-11-08 ENCOUNTER — Other Ambulatory Visit: Payer: Self-pay | Admitting: Internal Medicine

## 2023-11-08 DIAGNOSIS — Z1231 Encounter for screening mammogram for malignant neoplasm of breast: Secondary | ICD-10-CM

## 2023-11-25 DIAGNOSIS — E039 Hypothyroidism, unspecified: Secondary | ICD-10-CM | POA: Diagnosis not present

## 2023-11-25 DIAGNOSIS — Z79899 Other long term (current) drug therapy: Secondary | ICD-10-CM | POA: Diagnosis not present

## 2023-11-25 DIAGNOSIS — I10 Essential (primary) hypertension: Secondary | ICD-10-CM | POA: Diagnosis not present

## 2023-11-25 DIAGNOSIS — E785 Hyperlipidemia, unspecified: Secondary | ICD-10-CM | POA: Diagnosis not present

## 2023-11-25 DIAGNOSIS — R7303 Prediabetes: Secondary | ICD-10-CM | POA: Diagnosis not present

## 2023-12-02 DIAGNOSIS — E785 Hyperlipidemia, unspecified: Secondary | ICD-10-CM | POA: Diagnosis not present

## 2023-12-02 DIAGNOSIS — E039 Hypothyroidism, unspecified: Secondary | ICD-10-CM | POA: Diagnosis not present

## 2023-12-02 DIAGNOSIS — I480 Paroxysmal atrial fibrillation: Secondary | ICD-10-CM | POA: Diagnosis not present

## 2023-12-02 DIAGNOSIS — I890 Lymphedema, not elsewhere classified: Secondary | ICD-10-CM | POA: Diagnosis not present

## 2023-12-02 DIAGNOSIS — R7303 Prediabetes: Secondary | ICD-10-CM | POA: Diagnosis not present

## 2023-12-02 DIAGNOSIS — Z87891 Personal history of nicotine dependence: Secondary | ICD-10-CM | POA: Diagnosis not present

## 2023-12-02 DIAGNOSIS — I129 Hypertensive chronic kidney disease with stage 1 through stage 4 chronic kidney disease, or unspecified chronic kidney disease: Secondary | ICD-10-CM | POA: Diagnosis not present

## 2023-12-02 DIAGNOSIS — N1831 Chronic kidney disease, stage 3a: Secondary | ICD-10-CM | POA: Diagnosis not present

## 2023-12-02 DIAGNOSIS — R079 Chest pain, unspecified: Secondary | ICD-10-CM | POA: Diagnosis not present

## 2023-12-05 DIAGNOSIS — R002 Palpitations: Secondary | ICD-10-CM | POA: Diagnosis not present

## 2023-12-05 DIAGNOSIS — R079 Chest pain, unspecified: Secondary | ICD-10-CM | POA: Diagnosis not present

## 2023-12-08 ENCOUNTER — Ambulatory Visit
Admission: RE | Admit: 2023-12-08 | Discharge: 2023-12-08 | Disposition: A | Discharge status: Home / Self Care | Source: Ambulatory Visit | Attending: Internal Medicine | Admitting: Internal Medicine

## 2023-12-08 DIAGNOSIS — Z1231 Encounter for screening mammogram for malignant neoplasm of breast: Secondary | ICD-10-CM | POA: Insufficient documentation

## 2023-12-27 ENCOUNTER — Encounter (INDEPENDENT_AMBULATORY_CARE_PROVIDER_SITE_OTHER): Payer: Self-pay | Admitting: Vascular Surgery

## 2023-12-27 ENCOUNTER — Ambulatory Visit (INDEPENDENT_AMBULATORY_CARE_PROVIDER_SITE_OTHER): Admitting: Vascular Surgery

## 2023-12-27 VITALS — BP 122/80 | HR 62 | Resp 16 | Ht 62.0 in | Wt 164.0 lb

## 2023-12-27 DIAGNOSIS — E785 Hyperlipidemia, unspecified: Secondary | ICD-10-CM

## 2023-12-27 DIAGNOSIS — I890 Lymphedema, not elsewhere classified: Secondary | ICD-10-CM

## 2023-12-27 DIAGNOSIS — I83813 Varicose veins of bilateral lower extremities with pain: Secondary | ICD-10-CM | POA: Insufficient documentation

## 2023-12-27 DIAGNOSIS — I10 Essential (primary) hypertension: Secondary | ICD-10-CM | POA: Diagnosis not present

## 2023-12-27 DIAGNOSIS — D6851 Activated protein C resistance: Secondary | ICD-10-CM | POA: Diagnosis not present

## 2023-12-27 NOTE — Assessment & Plan Note (Signed)
 On eliquis

## 2023-12-27 NOTE — Assessment & Plan Note (Signed)
 The patient has a previous history of venous disease that has been treated with laser ablation and persistent symptoms of venous insufficiency despite appropriate conservative measures.  I have recommended a venous reflux study be done in the near future at her convenience.  I discussed the pathophysiology and natural history of venous disease.  I think the larger component of this is lymphedema, and she is already being appropriately treated with a lymphedema pump and compression.  She may benefit from referral to the lymphedema clinic if her venous ultrasound is unrevealing.  We will see her back following her study.

## 2023-12-27 NOTE — Assessment & Plan Note (Signed)
 lipid control important in reducing the progression of atherosclerotic disease. Continue statin therapy

## 2023-12-27 NOTE — Progress Notes (Signed)
 Patient ID: Bonnie Crawford, female   DOB: Jun 10, 1940, 83 y.o.   MRN: [id number]  Chief Complaint  Patient presents with   Follow-up    Ref [facility] consult venous insufficiency    HPI Bonnie Crawford is a 83 y.o. female.  I am asked to see the patient by Dr. Auston for evaluation of leg swelling, venous disease, and discoloration of the lower extremities.  She has seen 4 different vascular providers over the past decade or so.  She tries to wear compression socks, but she had large lipomas taken off from the medial upper calf and this is the area at the top of the socks and they tend to indent and roll down.  She has a lymphedema pump which she uses regularly with some improvement.  Despite her appropriate conservative therapies, she continues to have significant swelling in the lower extremities the left is more severely affected than the right.  This is associated with some discoloration.  She has had a previous venous ablation at some point in the past.  She does not have a previous history of DVT or superficial thrombophlebitis.     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    CAD (coronary artery disease) 04/12/2022   a.) cCTA 04/12/2022: Ca2+ 438 (76th percentile for age/sex/race matched control)   Cardiac murmur    Cervical spondylosis    Childhood asthma    CKD (chronic kidney disease), stage III (HCC)    Colon polyp    Diastolic dysfunction 12/23/2021   a.) TTE 12/23/2021: EF >55%, triv AR, mild MR/TR/PR, G1DD   Diverticulosis    Factor V Leiden (HCC)    GERD (gastroesophageal reflux disease)    Hemorrhoids     Herpes zoster    History of bilateral cataract extraction 2022   History of cardiac catheterization 07/15/2010   a.) LHC 07/15/2010: normal coronaries and hemodynamics; b.) LHC 04/05/2018: normal coronaries   History of kidney stones    Hyperlipidemia    Hypertension    Hypothyroidism    Inflammatory bowel disease    Lipoma 09/17/2015   Long term current use of amiodarone     Long term current use of anticoagulant    a.) apixaban    Lymphedema    MGUS (monoclonal gammopathy of unknown significance)    Multinodular goiter    Osteoporosis    PAT (paroxysmal atrial tachycardia) (HCC)    Prediabetes    PSVT (paroxysmal supraventricular tachycardia) (HCC)    PVC's (premature ventricular contractions)    Sinus bradycardia     Past Surgical History:  Procedure Laterality Date   ABDOMINAL HYSTERECTOMY     APPENDECTOMY     BLEPHAROPLASTY Bilateral    BREAST CYST ASPIRATION Left    neg   CARDIOVERSION N/A 05/04/2022   Procedure: CARDIOVERSION;  Surgeon: Ammon Blunt, MD;  Location: [facility];  Service: Cardiovascular;  Laterality: N/A;   CATARACT EXTRACTION W/PHACO Left 07/28/2020   Procedure: CATARACT EXTRACTION PHACO AND INTRAOCULAR LENS PLACEMENT (IOC) LEFT PANOPTIX TORIC 4.25 00:30.9;  Surgeon: Myrna Adine Anes, MD;  Location: [facility];  Service: Ophthalmology;  Laterality: Left;  CATARACT EXTRACTION W/PHACO Right 08/18/2020   Procedure: CATARACT EXTRACTION PHACO AND INTRAOCULAR LENS PLACEMENT (IOC) RIGHT PANOPTIX TORIC 2.31 00:20.0;  Surgeon: Myrna Adine Anes, MD;  Location: [facility];  Service: Ophthalmology;  Laterality: Right;   COLONOSCOPY     KNEE SURGERY Left    2 meniscus tears   LEFT HEART CATH AND CORONARY ANGIOGRAPHY Left 07/15/2010   Procedure: LEFT HEART CATH AND CORONARY ANGIOGRAPHY; Location: [facility]; Surgeon: Arley Pun, MD   LEFT HEART CATH AND CORONARY ANGIOGRAPHY Left 04/05/2018   Procedure: LEFT  HEART CATH AND CORONARY ANGIOGRAPHY; Location: [facility]; Surgeon: Renne Dull, MD   MASS EXCISION Right 11/22/2022   Procedure: EXCISION MASS;  Surgeon: Rodolph Romano, MD;  Location: [facility];  Service: General;  Laterality: Right;   NECK SURGERY     cosmetic     Family History  Problem Relation Age of Onset   Lung cancer Sister    Breast cancer Sister 46   Factor V Leiden deficiency Brother    Lung cancer Brother    Kidney cancer Brother    Factor V Leiden deficiency Brother    Factor V Leiden deficiency Brother    Stomach cancer Brother    Vasculitis Sister       Social History   Tobacco Use   Smoking status: Former    Current packs/day: 0.00    Types: Cigarettes    Quit date: 1997    Years since quitting: 28.6   Smokeless tobacco: Never  Vaping Use   Vaping status: Never Used  Substance Use Topics   Alcohol use: Not Currently    Comment: once a year   Drug use: Never     Allergies  Allergen Reactions   Metoprolol Succinate  [Metoprolol Tartrate] Other (See Comments)    headache   Other Rash    Dexatone-kidney medication   Dexamethasone Rash    Current Outpatient Medications  Medication Sig Dispense Refill   acetaminophen  (TYLENOL ) 500 MG tablet Take 500 mg by mouth every 6 (six) hours as needed.      apixaban  (ELIQUIS ) 5 MG TABS tablet Take 1 tablet (5 mg total) by mouth 2 (two) times daily. 60 tablet 3   atorvastatin  (LIPITOR) 20 MG tablet Take 20 mg by mouth at bedtime.     carboxymethylcellulose (REFRESH PLUS) 0.5 % SOLN Place 1 drop into both eyes daily as needed.     dextromethorphan-guaiFENesin  (MUCINEX  DM) 30-600 MG 12hr tablet Take 1 tablet by mouth daily.     levothyroxine (SYNTHROID) 50 MCG tablet Take 50 mcg by mouth daily before breakfast.     omeprazole (PRILOSEC) 20 MG capsule Take 20 mg by mouth daily as needed.      psyllium (METAMUCIL) 58.6 % powder Take 1 packet by mouth daily as needed.       Simethicone (GAS-X PO) Take by mouth as needed.     amiodarone  (PACERONE ) 200 MG tablet Take 1 tablet (200 mg total) by mouth 2 (two) times daily. For 5 days and then take 200 mg (1 tab) daily thereafter (Patient taking differently: Take 100 mg by mouth daily. For 5 days and then take 200 mg (1 tab) daily thereafter) 60 tablet 2   atenolol  (TENORMIN ) 50 MG tablet Take 1 tablet (50 mg total) by mouth daily. (Patient taking differently: Take 25 mg by mouth daily.) 60 tablet 2   calcium  citrate (CALCITRATE - DOSED IN MG ELEMENTAL CALCIUM ) 950 (200 Ca)  MG tablet Take 200 mg of elemental calcium  by mouth daily.     Calcium  Citrate-Vitamin D  200-3.125 MG-MCG TABS Take 315 mg by mouth in the morning and at bedtime.     cetirizine (ZYRTEC) 5 MG tablet Take 5 mg by mouth daily.     fluticasone (FLONASE) 50 MCG/ACT nasal spray Place 2 sprays into both nostrils as needed for allergies or rhinitis.     Multiple Vitamin (MULTIVITAMIN) tablet Take by mouth. (Patient not taking: Reported on 12/27/2023)     spironolactone  (ALDACTONE ) 25 MG tablet Take 25 mg by mouth daily. (Patient not taking: Reported on 11/22/2022)     tamsulosin (FLOMAX) 0.4 MG CAPS capsule Take 0.4 mg by mouth.     No current facility-administered medications for this visit.      REVIEW OF SYSTEMS (Negative unless checked)  Constitutional: [] Weight loss  [] Fever  [] Chills Cardiac: [] Chest pain   [] Chest pressure   [] Palpitations   [] Shortness of breath when laying flat   [] Shortness of breath at rest   [] Shortness of breath with exertion. Vascular:  [] Pain in legs with walking   [] Pain in legs at rest   [] Pain in legs when laying flat   [] Claudication   [] Pain in feet when walking  [] Pain in feet at rest  [] Pain in feet when laying flat   [] History of DVT   [] Phlebitis   [x] Swelling in legs   [x] Varicose veins   [] Non-healing ulcers Pulmonary:   [] Uses home oxygen   [] Productive cough   [] Hemoptysis   [] Wheeze  [] COPD   [] Asthma Neurologic:   [] Dizziness  [] Blackouts   [] Seizures   [] History of stroke   [] History of TIA  [] Aphasia   [] Temporary blindness   [] Dysphagia   [] Weakness or numbness in arms   [] Weakness or numbness in legs Musculoskeletal:  [] Arthritis   [] Joint swelling   [] Joint pain   [] Low back pain Hematologic:  [] Easy bruising  [] Easy bleeding   [x] Hypercoagulable state   [] Anemic  [] Hepatitis Gastrointestinal:  [] Blood in stool   [] Vomiting blood  [x] Gastroesophageal reflux/heartburn   [] Abdominal pain Genitourinary:  [] Chronic kidney disease   [] Difficult urination  [] Frequent urination  [] Burning with urination   [] Hematuria Skin:  [] Rashes   [] Ulcers   [] Wounds Psychological:  [] History of anxiety   []  History of major depression.    Physical Exam BP 122/80   Pulse 62   Resp 16   Ht 5' 2 (1.575 m)   Wt 164 lb (74.4 kg)   BMI 30.00 kg/m  Gen:  WD/WN, NAD. Appears younger than stated age. Head: [place]/AT, No temporalis wasting.  Ear/Nose/Throat: Hearing grossly intact, nares w/o erythema or drainage, oropharynx w/o Erythema/Exudate Eyes: Conjunctiva clear, sclera non-icteric  Neck: trachea midline.  No JVD.  Pulmonary:  Good air movement, respirations not labored, no use of accessory muscles  Cardiac: RRR, no JVD Vascular:  Vessel Right Left  Radial Palpable Palpable                                   Gastrointestinal:. No masses, surgical incisions, or scars. Musculoskeletal: M/S 5/5 throughout.  Extremities without ischemic changes.  No deformity or atrophy. 1+ RLE edema, 1-2+ LLE edema.  Hyperpigmentation is present with mild to moderate stasis dermatitis changes present. Neurologic: Sensation grossly intact in extremities.  Symmetrical.  Speech is fluent. Motor exam as listed above. Psychiatric: Judgment intact, Mood & affect appropriate  for pt's clinical situation. Dermatologic: No rashes or ulcers noted.  No cellulitis or open wounds.    Radiology MM 3D SCREENING MAMMOGRAM BILATERAL  BREAST Result Date: 12/13/2023 CLINICAL DATA:  Screening. EXAM: DIGITAL SCREENING BILATERAL MAMMOGRAM WITH TOMOSYNTHESIS AND CAD TECHNIQUE: Bilateral screening digital craniocaudal and mediolateral oblique mammograms were obtained. Bilateral screening digital breast tomosynthesis was performed. The images were evaluated with computer-aided detection. COMPARISON:  Previous exam(s). ACR Breast Density Category c: The breasts are heterogeneously dense, which may obscure small masses. FINDINGS: There are no findings suspicious for malignancy. IMPRESSION: No mammographic evidence of malignancy. A result letter of this screening mammogram will be mailed directly to the patient. RECOMMENDATION: Screening mammogram in one year. (Code:SM-B-01Y) BI-RADS CATEGORY  1: Negative. Electronically Signed   By: Norleen Croak M.D.   On: 12/13/2023 10:58    Labs No results found for this or any previous visit (from the past 2160 hours).  Assessment/Plan:  Essential hypertension blood pressure control important in reducing the progression of atherosclerotic disease. On appropriate oral medications.   Factor V Leiden mutation (HCC) On eliquis   HLD (hyperlipidemia) lipid control important in reducing the progression of atherosclerotic disease. Continue statin therapy   Varicose veins of both lower extremities with pain The patient has a previous history of venous disease that has been treated with laser ablation and persistent symptoms of venous insufficiency despite appropriate conservative measures.  I have recommended a venous reflux study be done in the near future at her convenience.  I discussed the pathophysiology and natural history of venous disease.  I think the larger component of this is lymphedema, and she is already being appropriately treated with a lymphedema pump and compression.  She may benefit from referral to the lymphedema clinic if her venous ultrasound is unrevealing.  We will see her back  following her study.  Lymphedema The patient has a previous history of venous disease that has been treated with laser ablation and persistent symptoms of venous insufficiency despite appropriate conservative measures.  I have recommended a venous reflux study be done in the near future at her convenience.  I discussed the pathophysiology and natural history of venous disease.  I think the larger component of this is lymphedema, and she is already being appropriately treated with a lymphedema pump and compression.  She may benefit from referral to the lymphedema clinic if her venous ultrasound is unrevealing.  We will see her back following her study.      Bonnie Crawford 12/27/2023, 4:27 PM   This note was created with Dragon medical transcription system.  Any errors from dictation are unintentional.

## 2023-12-27 NOTE — Assessment & Plan Note (Signed)
 blood pressure control important in reducing the progression of atherosclerotic disease. On appropriate oral medications.

## 2024-01-10 DIAGNOSIS — I890 Lymphedema, not elsewhere classified: Secondary | ICD-10-CM | POA: Diagnosis not present

## 2024-01-10 DIAGNOSIS — E785 Hyperlipidemia, unspecified: Secondary | ICD-10-CM | POA: Diagnosis not present

## 2024-01-10 DIAGNOSIS — I4719 Other supraventricular tachycardia: Secondary | ICD-10-CM | POA: Diagnosis not present

## 2024-01-10 DIAGNOSIS — R079 Chest pain, unspecified: Secondary | ICD-10-CM | POA: Diagnosis not present

## 2024-01-10 DIAGNOSIS — I493 Ventricular premature depolarization: Secondary | ICD-10-CM | POA: Diagnosis not present

## 2024-01-10 DIAGNOSIS — I2510 Atherosclerotic heart disease of native coronary artery without angina pectoris: Secondary | ICD-10-CM | POA: Diagnosis not present

## 2024-01-10 DIAGNOSIS — I10 Essential (primary) hypertension: Secondary | ICD-10-CM | POA: Diagnosis not present

## 2024-01-10 DIAGNOSIS — R001 Bradycardia, unspecified: Secondary | ICD-10-CM | POA: Diagnosis not present

## 2024-01-10 DIAGNOSIS — I480 Paroxysmal atrial fibrillation: Secondary | ICD-10-CM | POA: Diagnosis not present

## 2024-01-30 DIAGNOSIS — N1831 Chronic kidney disease, stage 3a: Secondary | ICD-10-CM | POA: Diagnosis not present

## 2024-01-30 DIAGNOSIS — R600 Localized edema: Secondary | ICD-10-CM | POA: Diagnosis not present

## 2024-01-30 DIAGNOSIS — I10 Essential (primary) hypertension: Secondary | ICD-10-CM | POA: Diagnosis not present

## 2024-01-31 ENCOUNTER — Other Ambulatory Visit (INDEPENDENT_AMBULATORY_CARE_PROVIDER_SITE_OTHER)

## 2024-01-31 ENCOUNTER — Ambulatory Visit (INDEPENDENT_AMBULATORY_CARE_PROVIDER_SITE_OTHER): Admitting: Vascular Surgery

## 2024-01-31 ENCOUNTER — Encounter (INDEPENDENT_AMBULATORY_CARE_PROVIDER_SITE_OTHER): Payer: Self-pay | Admitting: Vascular Surgery

## 2024-01-31 VITALS — BP 125/80 | HR 67 | Ht 62.0 in | Wt 165.8 lb

## 2024-01-31 DIAGNOSIS — E785 Hyperlipidemia, unspecified: Secondary | ICD-10-CM

## 2024-01-31 DIAGNOSIS — I83813 Varicose veins of bilateral lower extremities with pain: Secondary | ICD-10-CM

## 2024-01-31 DIAGNOSIS — D6851 Activated protein C resistance: Secondary | ICD-10-CM | POA: Diagnosis not present

## 2024-01-31 DIAGNOSIS — I890 Lymphedema, not elsewhere classified: Secondary | ICD-10-CM

## 2024-01-31 DIAGNOSIS — N1831 Chronic kidney disease, stage 3a: Secondary | ICD-10-CM | POA: Diagnosis not present

## 2024-01-31 DIAGNOSIS — I10 Essential (primary) hypertension: Secondary | ICD-10-CM

## 2024-01-31 NOTE — Assessment & Plan Note (Signed)
 Rena issues can worsen LE swelling.

## 2024-01-31 NOTE — Progress Notes (Unsigned)
 MRN : [id number]  Bonnie Crawford is a 83 y.o. (1941-04-11) female who presents with chief complaint of  Chief Complaint  Patient presents with   Follow-up    Pain in left leg and discuss xray for leg as well  .  History of Present Illness: Patient returns today in follow up of leg pain and swelling.  She was trying to use her compression socks and had a fairly bad episode with the socks she was wearing creating a tight ring on her upper calf that lasted for days and caused her severe pain and she says worsen the swelling as well.  She has stopped using compression garments for that reason.  She is using her edema pump regularly and elevating her legs.  All in all, her legs are about the same as they were when I saw her in office last month.  A venous reflux study showed successful ablation of the great saphenous veins.  No deep venous reflux was identified that had been seen previously.  She did have a small segment of the left anterior accessory saphenous vein present that does not appear ablated.  No DVT or superficial thrombophlebitis was seen.  Current Outpatient Medications  Medication Sig Dispense Refill   acetaminophen  (TYLENOL ) 500 MG tablet Take 500 mg by mouth every 6 (six) hours as needed.      apixaban  (ELIQUIS ) 5 MG TABS tablet Take 1 tablet (5 mg total) by mouth 2 (two) times daily. 60 tablet 3   atorvastatin  (LIPITOR) 20 MG tablet Take 20 mg by mouth at bedtime.     carboxymethylcellulose (REFRESH PLUS) 0.5 % SOLN Place 1 drop into both eyes daily as needed.     dextromethorphan-guaiFENesin  (MUCINEX  DM) 30-600 MG 12hr tablet Take 1 tablet by mouth daily.     levothyroxine (SYNTHROID) 50 MCG tablet Take 50 mcg by mouth daily before breakfast.     omeprazole (PRILOSEC) 20 MG capsule Take 20 mg by mouth daily as needed.      psyllium (METAMUCIL) 58.6 % powder Take 1 packet by mouth daily as needed.      Simethicone (GAS-X PO) Take by mouth as needed.     amiodarone  (PACERONE )  200 MG tablet Take 1 tablet (200 mg total) by mouth 2 (two) times daily. For 5 days and then take 200 mg (1 tab) daily thereafter (Patient taking differently: Take 100 mg by mouth daily. For 5 days and then take 200 mg (1 tab) daily thereafter) 60 tablet 2   atenolol  (TENORMIN ) 50 MG tablet Take 1 tablet (50 mg total) by mouth daily. (Patient taking differently: Take 25 mg by mouth daily.) 60 tablet 2   calcium  citrate (CALCITRATE - DOSED IN MG ELEMENTAL CALCIUM ) 950 (200 Ca) MG tablet Take 200 mg of elemental calcium  by mouth daily.     Calcium  Citrate-Vitamin D  200-3.125 MG-MCG TABS Take 315 mg by mouth in the morning and at bedtime.     carvedilol (COREG) 3.125 MG tablet Take 3.125 mg by mouth 2 (two) times daily.     cetirizine (ZYRTEC) 5 MG tablet Take 5 mg by mouth daily.     fluticasone (FLONASE) 50 MCG/ACT nasal spray Place 2 sprays into both nostrils as needed for allergies or rhinitis.     Multiple Vitamin (MULTIVITAMIN) tablet Take by mouth. (Patient not taking: Reported on 12/27/2023)     spironolactone  (ALDACTONE ) 25 MG tablet Take 25 mg by mouth daily. (Patient not taking: Reported on 11/22/2022)  tamsulosin (FLOMAX) 0.4 MG CAPS capsule Take 0.4 mg by mouth.     No current facility-administered medications for this visit.    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    CAD (coronary artery disease) 04/12/2022   a.) cCTA 04/12/2022: Ca2+ 438 (76th percentile for age/sex/race matched control)   Cardiac murmur    Cervical spondylosis    Childhood asthma    CKD (chronic kidney disease), stage III (HCC)    Colon polyp     Diastolic dysfunction 12/23/2021   a.) TTE 12/23/2021: EF >55%, triv AR, mild MR/TR/PR, G1DD   Diverticulosis    Factor V Leiden (HCC)    GERD (gastroesophageal reflux disease)    Hemorrhoids    Herpes zoster    History of bilateral cataract extraction 2022   History of cardiac catheterization 07/15/2010   a.) LHC 07/15/2010: normal coronaries and hemodynamics; b.) LHC 04/05/2018: normal coronaries   History of kidney stones    Hyperlipidemia    Hypertension    Hypothyroidism    Inflammatory bowel disease    Lipoma 09/17/2015   Long term current use of amiodarone     Long term current use of anticoagulant    a.) apixaban    Lymphedema    MGUS (monoclonal gammopathy of unknown significance)    Multinodular goiter    Osteoporosis    PAT (paroxysmal atrial tachycardia) (HCC)    Prediabetes    PSVT (paroxysmal supraventricular tachycardia) (HCC)    PVC's (premature ventricular contractions)    Sinus bradycardia     Past Surgical History:  Procedure Laterality Date   ABDOMINAL HYSTERECTOMY     APPENDECTOMY     BLEPHAROPLASTY Bilateral    BREAST CYST ASPIRATION Left    neg   CARDIOVERSION N/A 05/04/2022   Procedure: CARDIOVERSION;  Surgeon: Ammon Blunt, MD;  Location: [facility];  Service: Cardiovascular;  Laterality: N/A;   CATARACT EXTRACTION W/PHACO Left 07/28/2020   Procedure: CATARACT EXTRACTION PHACO AND INTRAOCULAR LENS PLACEMENT (IOC) LEFT PANOPTIX TORIC 4.25 00:30.9;  Surgeon: Myrna Adine Anes, MD;  Location: [facility];  Service: Ophthalmology;  Laterality: Left;   CATARACT EXTRACTION W/PHACO Right 08/18/2020   Procedure: CATARACT EXTRACTION PHACO AND INTRAOCULAR LENS PLACEMENT (IOC) RIGHT PANOPTIX TORIC 2.31 00:20.0;  Surgeon: Myrna Adine Anes, MD;  Location: [facility];  Service: Ophthalmology;  Laterality: Right;   COLONOSCOPY     KNEE SURGERY Left    2 meniscus tears   LEFT HEART CATH AND CORONARY ANGIOGRAPHY Left 07/15/2010    Procedure: LEFT HEART CATH AND CORONARY ANGIOGRAPHY; Location: [facility]; Surgeon: Arley Pun, MD   LEFT HEART CATH AND CORONARY ANGIOGRAPHY Left 04/05/2018   Procedure: LEFT HEART CATH AND CORONARY ANGIOGRAPHY; Location: [facility]; Surgeon: Renne Dull, MD   MASS EXCISION Right 11/22/2022   Procedure: EXCISION MASS;  Surgeon: Rodolph Romano, MD;  Location: [facility];  Service: General;  Laterality: Right;   NECK SURGERY     cosmetic     Social History   Tobacco Use   Smoking status: Former    Current packs/day: 0.00  Types: Cigarettes    Quit date: 75    Years since quitting: 28.7   Smokeless tobacco: Never  Vaping Use   Vaping status: Never Used  Substance Use Topics   Alcohol use: Not Currently    Comment: once a year   Drug use: Never      Family History  Problem Relation Age of Onset   Lung cancer Sister    Breast cancer Sister 52   Factor V Leiden deficiency Brother    Lung cancer Brother    Kidney cancer Brother    Factor V Leiden deficiency Brother    Factor V Leiden deficiency Brother    Stomach cancer Brother    Vasculitis Sister      Allergies  Allergen Reactions   Metoprolol Succinate  [Metoprolol Tartrate] Other (See Comments)    headache   Other Rash    Dexatone-kidney medication   Dexamethasone Rash     REVIEW OF SYSTEMS (Negative unless checked)   Constitutional: [] Weight loss  [] Fever  [] Chills Cardiac: [] Chest pain   [] Chest pressure   [] Palpitations   [] Shortness of breath when laying flat   [] Shortness of breath at rest   [] Shortness of breath with exertion. Vascular:  [] Pain in legs with walking   [] Pain in legs at rest   [] Pain in legs when laying flat   [] Claudication   [] Pain in feet when walking  [] Pain in feet at rest  [] Pain in feet when laying flat   [] History of DVT   [] Phlebitis   [x] Swelling in legs   [x] Varicose veins   [] Non-healing ulcers Pulmonary:    [] Uses home oxygen   [] Productive cough   [] Hemoptysis   [] Wheeze  [] COPD   [] Asthma Neurologic:  [] Dizziness  [] Blackouts   [] Seizures   [] History of stroke   [] History of TIA  [] Aphasia   [] Temporary blindness   [] Dysphagia   [] Weakness or numbness in arms   [] Weakness or numbness in legs Musculoskeletal:  [] Arthritis   [] Joint swelling   [] Joint pain   [] Low back pain Hematologic:  [] Easy bruising  [] Easy bleeding   [x] Hypercoagulable state   [] Anemic  [] Hepatitis Gastrointestinal:  [] Blood in stool   [] Vomiting blood  [x] Gastroesophageal reflux/heartburn   [] Abdominal pain Genitourinary:  [] Chronic kidney disease   [] Difficult urination  [] Frequent urination  [] Burning with urination   [] Hematuria Skin:  [] Rashes   [] Ulcers   [] Wounds Psychological:  [] History of anxiety   []  History of major depression.  Physical Examination  BP 125/80 (BP Location: Left Arm, Patient Position: Sitting, Cuff Size: Normal)   Pulse 67   Ht 5' 2 (1.575 m)   Wt 165 lb 12.8 oz (75.2 kg)   BMI 30.33 kg/m  Gen:  WD/WN, NAD Head: [place]/AT, No temporalis wasting. Ear/Nose/Throat: Hearing grossly intact, nares w/o erythema or drainage Eyes: Conjunctiva clear. Sclera non-icteric Neck: Supple.  Trachea midline Pulmonary:  Good air movement, no use of accessory muscles.  Cardiac: RRR, no JVD Vascular:  Vessel Right Left  Radial Palpable Palpable           Musculoskeletal: M/S 5/5 throughout.  No deformity or atrophy. Trace LE edema. Neurologic: Sensation grossly intact in extremities.  Symmetrical.  Speech is fluent.  Psychiatric: Judgment intact, Mood & affect appropriate for pt's clinical situation. Dermatologic: No rashes or ulcers noted.  No cellulitis or open wounds.      Labs No results found for this or any previous visit (from the past 2160 hours).  Radiology  VAS US  LOWER EXTREMITY VENOUS REFLUX Result Date: 02/01/2024  Lower Venous Reflux Study Patient Name:  Bonnie Crawford  Date of Exam:    01/31/2024 Medical Rec #: [id number]         Accession #:    [id number] Date of Birth: 1940/10/18        Patient Gender: F Patient Age:   34 years Exam Location:  [facility] Procedure:      VAS US  LOWER EXTREMITY VENOUS REFLUX Referring Phys: SELINDA GU --------------------------------------------------------------------------------  Indications: Swelling, and lt >rt hx of lymphedema.  Performing Technologist: Jerel Croak RVT  Examination Guidelines: A complete evaluation includes B-mode imaging, spectral Doppler, color Doppler, and power Doppler as needed of all accessible portions of each vessel. Bilateral testing is considered an integral part of a complete examination. Limited examinations for reoccurring indications may be performed as noted. The reflux portion of the exam is performed with the patient in reverse Trendelenburg. Significant venous reflux is defined as >500 ms in the superficial venous system, and >1 second in the deep venous system.   Summary: Bilateral: - No evidence of deep vein thrombosis seen in the lower extremities, bilaterally, from the common femoral through the popliteal veins. - No evidence of superficial venous thrombosis in the lower extremities, bilaterally. - No evidence of deep venous insufficiency seen bilaterally in the lower extremity. - No evidence of superficial venous reflux seen in the short saphenous veins bilaterally.  Right: - Rt GSV ablated. Deep system reflux no longer present as compared to previous study of 2021.  Left: - Lt GSV ablated. A short Anterior accesory to mid thigh that does not show ablation. Deep system reflux no longer present as compared to previous study of 2021.  *See table(s) above for measurements and observations. Electronically signed by SELINDA GU MD on 02/01/2024 at 8:21:33 AM.    Final     Assessment/Plan  Stage 3a chronic kidney disease (HCC) Rena issues can worsen LE swelling.  Varicose veins of both lower extremities with  pain A venous reflux study showed successful ablation of the great saphenous veins.  No deep venous reflux was identified that had been seen previously.  She did have a small segment of the left anterior accessory saphenous vein present that does not appear ablated.  No DVT or superficial thrombophlebitis was seen.  No venous disease that would require any intervention at current.  She is going to try to get some different compression socks maybe at a lower pressure that she can wear more regularly.  She will contact our office with any worsening symptoms.  Lymphedema A venous reflux study showed successful ablation of the great saphenous veins.  No deep venous reflux was identified that had been seen previously.  She did have a small segment of the left anterior accessory saphenous vein present that does not appear ablated.  No DVT or superficial thrombophlebitis was seen.  No venous disease that would require any intervention at current.  She is going to try to get some different compression socks maybe at a lower pressure that she can wear more regularly.  She will contact our office with any worsening symptoms.  Essential hypertension blood pressure control important in reducing the progression of atherosclerotic disease. On appropriate oral medications.     Factor V Leiden mutation (HCC) On eliquis    HLD (hyperlipidemia) lipid control important in reducing the progression of atherosclerotic disease. Continue statin therapy  SELINDA GU, MD  02/02/2024 9:21 AM  This note was created with Dragon medical transcription system.  Any errors from dictation are purely unintentional

## 2024-02-02 NOTE — Assessment & Plan Note (Signed)
 A venous reflux study showed successful ablation of the great saphenous veins.  No deep venous reflux was identified that had been seen previously.  She did have a small segment of the left anterior accessory saphenous vein present that does not appear ablated.  No DVT or superficial thrombophlebitis was seen.  No venous disease that would require any intervention at current.  She is going to try to get some different compression socks maybe at a lower pressure that she can wear more regularly.  She will contact our office with any worsening symptoms.

## 2024-02-06 DIAGNOSIS — R600 Localized edema: Secondary | ICD-10-CM | POA: Diagnosis not present

## 2024-02-06 DIAGNOSIS — I10 Essential (primary) hypertension: Secondary | ICD-10-CM | POA: Diagnosis not present

## 2024-02-06 DIAGNOSIS — N182 Chronic kidney disease, stage 2 (mild): Secondary | ICD-10-CM | POA: Diagnosis not present

## 2024-02-23 DIAGNOSIS — J019 Acute sinusitis, unspecified: Secondary | ICD-10-CM | POA: Diagnosis not present

## 2024-02-23 DIAGNOSIS — R051 Acute cough: Secondary | ICD-10-CM | POA: Diagnosis not present

## 2024-02-23 DIAGNOSIS — B9689 Other specified bacterial agents as the cause of diseases classified elsewhere: Secondary | ICD-10-CM | POA: Diagnosis not present

## 2024-02-23 DIAGNOSIS — Z03818 Encounter for observation for suspected exposure to other biological agents ruled out: Secondary | ICD-10-CM | POA: Diagnosis not present

## 2024-03-05 DIAGNOSIS — Z87891 Personal history of nicotine dependence: Secondary | ICD-10-CM | POA: Diagnosis not present

## 2024-03-05 DIAGNOSIS — I480 Paroxysmal atrial fibrillation: Secondary | ICD-10-CM | POA: Diagnosis not present

## 2024-03-05 DIAGNOSIS — I129 Hypertensive chronic kidney disease with stage 1 through stage 4 chronic kidney disease, or unspecified chronic kidney disease: Secondary | ICD-10-CM | POA: Diagnosis not present

## 2024-03-05 DIAGNOSIS — Z79899 Other long term (current) drug therapy: Secondary | ICD-10-CM | POA: Diagnosis not present

## 2024-03-05 DIAGNOSIS — E785 Hyperlipidemia, unspecified: Secondary | ICD-10-CM | POA: Diagnosis not present

## 2024-03-05 DIAGNOSIS — E039 Hypothyroidism, unspecified: Secondary | ICD-10-CM | POA: Diagnosis not present

## 2024-03-05 DIAGNOSIS — N1831 Chronic kidney disease, stage 3a: Secondary | ICD-10-CM | POA: Diagnosis not present

## 2024-03-05 DIAGNOSIS — R7303 Prediabetes: Secondary | ICD-10-CM | POA: Diagnosis not present

## 2024-03-20 DIAGNOSIS — G8929 Other chronic pain: Secondary | ICD-10-CM | POA: Diagnosis not present

## 2024-03-20 DIAGNOSIS — M25561 Pain in right knee: Secondary | ICD-10-CM | POA: Diagnosis not present

## 2024-03-20 DIAGNOSIS — M25572 Pain in left ankle and joints of left foot: Secondary | ICD-10-CM | POA: Diagnosis not present

## 2024-03-20 DIAGNOSIS — M25562 Pain in left knee: Secondary | ICD-10-CM | POA: Diagnosis not present

## 2024-03-26 DIAGNOSIS — M25572 Pain in left ankle and joints of left foot: Secondary | ICD-10-CM | POA: Diagnosis not present

## 2024-03-26 DIAGNOSIS — M19072 Primary osteoarthritis, left ankle and foot: Secondary | ICD-10-CM | POA: Diagnosis not present

## 2024-04-17 DIAGNOSIS — M19072 Primary osteoarthritis, left ankle and foot: Secondary | ICD-10-CM | POA: Diagnosis not present

## 2024-04-17 DIAGNOSIS — M25572 Pain in left ankle and joints of left foot: Secondary | ICD-10-CM | POA: Diagnosis not present

## 2024-04-19 DIAGNOSIS — H43813 Vitreous degeneration, bilateral: Secondary | ICD-10-CM | POA: Diagnosis not present

## 2024-04-19 DIAGNOSIS — H348122 Central retinal vein occlusion, left eye, stable: Secondary | ICD-10-CM | POA: Diagnosis not present

## 2024-04-19 DIAGNOSIS — Z961 Presence of intraocular lens: Secondary | ICD-10-CM | POA: Diagnosis not present

## 2024-04-19 DIAGNOSIS — H04123 Dry eye syndrome of bilateral lacrimal glands: Secondary | ICD-10-CM | POA: Diagnosis not present
